# Patient Record
Sex: Male | Born: 1955
Health system: Southern US, Community
[De-identification: ages and names within clinical notes are randomized; demographics above are authoritative.]

## PROBLEM LIST (undated history)

## (undated) DIAGNOSIS — G959 Disease of spinal cord, unspecified: Secondary | ICD-10-CM

## (undated) DIAGNOSIS — E7800 Pure hypercholesterolemia, unspecified: Secondary | ICD-10-CM

## (undated) DIAGNOSIS — I350 Nonrheumatic aortic (valve) stenosis: Secondary | ICD-10-CM

## (undated) DIAGNOSIS — I10 Essential (primary) hypertension: Secondary | ICD-10-CM

## (undated) DIAGNOSIS — E119 Type 2 diabetes mellitus without complications: Secondary | ICD-10-CM

## (undated) HISTORY — PX: HERNIA REPAIR: SHX51

## (undated) HISTORY — DX: Nonrheumatic aortic (valve) stenosis: I35.0

## (undated) HISTORY — PX: BACK SURGERY: SHX140

## (undated) HISTORY — DX: Disease of spinal cord, unspecified: G95.9

---

## 1980-03-29 DIAGNOSIS — I639 Cerebral infarction, unspecified: Secondary | ICD-10-CM

## 1980-03-29 HISTORY — DX: Cerebral infarction, unspecified: I63.9

## 2000-10-20 ENCOUNTER — Emergency Department (HOSPITAL_COMMUNITY): Admission: EM | Admit: 2000-10-20 | Discharge: 2000-10-20 | Payer: Self-pay

## 2000-10-22 ENCOUNTER — Emergency Department (HOSPITAL_COMMUNITY): Admission: EM | Admit: 2000-10-22 | Discharge: 2000-10-22 | Payer: Self-pay | Admitting: Emergency Medicine

## 2001-06-23 ENCOUNTER — Ambulatory Visit (HOSPITAL_COMMUNITY): Admission: RE | Admit: 2001-06-23 | Discharge: 2001-06-23 | Payer: Self-pay

## 2001-07-21 ENCOUNTER — Encounter: Payer: Self-pay | Admitting: Neurosurgery

## 2001-07-25 ENCOUNTER — Inpatient Hospital Stay (HOSPITAL_COMMUNITY): Admission: RE | Admit: 2001-07-25 | Discharge: 2001-07-25 | Payer: Self-pay | Admitting: Neurosurgery

## 2006-08-18 ENCOUNTER — Ambulatory Visit (HOSPITAL_COMMUNITY): Admission: RE | Admit: 2006-08-18 | Discharge: 2006-08-18 | Payer: Self-pay | Admitting: Ophthalmology

## 2006-09-01 ENCOUNTER — Ambulatory Visit (HOSPITAL_COMMUNITY): Admission: RE | Admit: 2006-09-01 | Discharge: 2006-09-01 | Payer: Self-pay | Admitting: Ophthalmology

## 2010-08-11 NOTE — Op Note (Signed)
NAME:  Thomas [name], Thomas [name]               ACCOUNT NO.:  [id number]   MEDICAL RECORD NO.:  [id number]          PATIENT TYPE:  AMB   LOCATION:  SDS                          FACILITY:  [facility]   PHYSICIAN:  Robert L. Dione Booze, M.D.  DATE OF BIRTH:  19-Jul-1955   DATE OF PROCEDURE:  08/18/2006  DATE OF DISCHARGE:                               OPERATIVE REPORT   INDICATIONS AND JUSTIFICATIONS FOR THE PROCEDURE:  The patient has been  followed in my office since 1996 and over the recent years has bene  developing cataracts.  On July 22, 2006, he was significantly bothered  by the vision in his right eye.  Examination showed that the vision was  2200 in the right, drooping to count fingers with glare testing.  In the  left it was around 2025-2030, but surprisingly also does drop to count  fingers with glare testing.  Pressures were 20 in each eye and  confrontation fields were full.  The pupil motility, eyelids,  conjunctiva, cornea, anterior chamber and dilated fundus examination  were negative.  In the slit-lamp, he had primarily posterior subcapsular  cataracts bilaterally with the right significantly worse than the left.  The left was significant, however.  The problem was discussed and he  decided to go ahead and have his right cataract removed and have this  combined with the use of lens implant.  Medically, he should be stable.   JUSTIFICATION FOR PERFORMING THE PROCEDURE IN AN OUTPATIENT SETTING:  Routine.   JUSTIFICATION FOR OVERNIGHT STAY:  None.   PREOPERATIVE DIAGNOSIS:  Dense posterior subcapsular cataract, right  eye.   POSTOPERATIVE DIAGNOSIS:  Dense posterior subcapsular cataract, right  eye.   SURGEON:  Robert L. Dione Booze, M.D.   ANESTHESIA:  Standby with intravenous sedation and topical drops to  anesthetize the eye.   PROCEDURE PERFORMED:  Phacoemulsification with cataract extraction of  the right eye with lens implant.   PROCEDURE IN DETAIL:  The patient arrived in the  operating room and was  prepped and draped in the routine fashion.  A lid speculum was  positioned in the right eye.  A 3-mm temporal clear corneal incision was  made and some viscoelastic was put into the anterior chamber.  A  Superblade was used to make a small incision at the periphery of the  cornea supranasally.  A capsulorrhexis was then performed and  hydrodissection and hydrodelineation was performed.  The nucleus was  removed with the phacoemulsification unit.  The irrigation and  aspiration cannula was used to remove the residual cortical material.   Next, the posterior capsule did not polish well because of the density  of the posterior subcapsular cataract.  It does appear that a Yag laser  capsulotomy will be needed later.  Next, viscoelastic was put into the  eye and the lens implant was put into the eye and went nicely.  The  wound was hydrated and Zymar and Acular drops were used.  The eye was  covered with a shield.  The patient then left the operating room, having  done nicely.   FOLLOW-UP CARE:  The patient is to be seen in my office the next day to  have the eye examined and to be given further instructions.           ______________________________  Doris Cheadle Dione Booze, M.D.     RLG/MEDQ  D:  08/18/2006  T:  08/18/2006  Job:  [id number]   cc:   Areatha Keas, M.D.  Dr. Nobie Putnam

## 2010-08-11 NOTE — Op Note (Signed)
NAME:  Thomas [name], Thomas [name]               ACCOUNT NO.:  [id number]   MEDICAL RECORD NO.:  [id number]          PATIENT TYPE:  AMB   LOCATION:  SDS                          FACILITY:  [facility]   PHYSICIAN:  Robert L. Dione Booze, M.D.  DATE OF BIRTH:  April 03, 1955   DATE OF PROCEDURE:  09/01/2006  DATE OF DISCHARGE:  09/01/2006                               OPERATIVE REPORT   INDICATIONS AND JUSTIFICATION FOR THE PROCEDURE:  Mr. Spilde was seen  recently in my office on July 22, 2006 reporting that he had  significant visual disability from posterior subcapsular cataracts.  Indeed with glare testing, his left eye, which was correctable, was  around 20/30 to 20/40.  The count fingers on the right was best  corrected to 20/200.  Pressures were 20 and confrontation fields were  full.  The pupil's motility, lids, conjunctivae, cornea and dilated  fundus examination were negative, and he had dense posterior subcapsular  cataracts with the right worse than the left.  He already has had an  uncomplicated right cataract extraction with lens implant and now comes  back to have his left eye done.  Medically he should be stable.   JUSTIFICATION FOR PERFORMING PROCEDURE IN OUTPATIENT SETTING:  Routine.   JUSTIFICATION FOR OVERNIGHT STAY:  None.   PREOPERATIVE DIAGNOSIS:  Dense posterior subcapsular cataract.   POSTOPERATIVE DIAGNOSIS:  Dense posterior subcapsular cataract.   OPERATION PERFORMED:  Phacoemulsification cataract extraction of the  left eye with lens implant.   ANESTHESIA USED:  Is topical with IV anesthesia standby.   SURGEON:  Robert L. Dione Booze, M.D.   PROCEDURE:  The patient arrived in the operating room and was prepped  and draped in the routine fashion.  A lid speculum was positioned and a  temporal 3 mm limbal incision was made into clear cornea.  Some  viscoelastic material was placed into the anterior chamber and a  capsulorrhexis was created with a bent needle and capsulorrhexis  forceps.  Hydrodissection was performed and the nucleus was removed with  phacoemulsification unit.  The irrigation and aspiration unit was used  to remove the residual cornea material and the posterior capsule was  vacuumed.  More viscoelastic material was put into the anterior chamber  and the lens implant was placed into the capsular sac.  Irrigation and  aspiration were used again to remove the viscoelastic material and the  wound was hydrated.  Some Miochol was used and the globe was reformed  using balanced salt.  Topical __________  was used and the eye was  covered with a shield.  The patient then left the operating room having  done nicely.   FOLLOWUP CARE:  The patient is to be seen in my office the next day to  have the eye examined and to be given further instructions.           ______________________________  Doris Cheadle Dione Booze, M.D.     RLG/MEDQ  D:  09/01/2006  T:  09/02/2006  Job:  [id number]   cc:   Areatha Keas, M.D.  Dr. Liz Malady

## 2010-08-14 NOTE — H&P (Signed)
[place]  Patient:    Thomas [name], Thomas [name] Visit Number: [id number] MRN: [id number]          Service Type: SUR Location: 3000 3030 01 Attending Physician:  Emeterio Reeve Dictated by:   Payton Doughty, M.D. Admit Date:  07/25/2001 Discharge Date: 07/25/2001                           History and Physical  ADMITTING DIAGNOSIS:  Recurrent herniated disk at L5-S1 on the right side.  SERVICE:  Neurosurgery.  HISTORY OF PRESENT ILLNESS:  [age]-year-old right-handed white gentleman who in 1997 had a 5-1 disk done by Dr. Jeannett Senior C. Roxan Hockey.  A month ago, he had a marked increase in his back pain and right lower extremity pain.  MRI demonstrated a recurrent disk and he was referred to me.  MEDICAL HISTORY:  Medical history is otherwise remarkable for adult-onset diabetes and hypertension.  He had an operation by Dr. Izell Tupelo. Deaton in 1992 at L4-5, after which he was disabled.  PAST SURGICAL HISTORY:  Two back operations, in 1992 and 1997, a hernia operation in 1974 and a knee operation in 1988.  MEDICATIONS: 1. OxyContin 20 mg b.i.d. 2. Lorcet 10 mg four a day on a p.r.n. basis. 3. Pravachol 40 mg a day. 4. Klor-Con 10 mEq twice a day. 5. Hydrochlorothiazide 50 mg a day. 6. Synthroid 0.125 mg a day. 7. Glucotrol XL 5 mg b.i.d. 8. Lisinopril 10 mg q.i.d. 9. Nortriptyline 150 mg at night.  DRUG ALLERGIES:  None.  SOCIAL HISTORY:  He does not smoke or drink.  He is on disability.  FAMILY HISTORY:  Mom is 74, daddy is 70, history not given.  REVIEW OF SYSTEMS:  Review of systems is remarkable for glasses, hypertension, leg pain, diabetes and thyroid disease.   PHYSICAL EXAMINATION:  HEENT:  Within normal limits.  NECK:  He has reasonable range of motion in his neck.  CHEST:  Clear.  CARDIAC:  Regular rate and rhythm.  ABDOMEN:  Nontender with no hepatosplenomegaly.  EXTREMITIES:  Without clubbing or cyanosis.  GU:   Deferred.  PERIPHERAL PULSES:  Good.  NEUROLOGIC:  He is awake, alert and oriented.  His cranial nerves are intact. Motor exam showed 5/5 strength throughout the upper and lower extremities with slight dorsiflexion weakness on the right side.  Reflexes are a flicker at the knees and absent at the ankles bilaterally.  Straight leg is positive on the right.  LABORATORY AND ACCESSORY DATA:  MRI demonstrates a disk at L5-S1, recurrent, eccentric to the right.  There is scarring around the left L5 neural foramen.  CLINICAL IMPRESSION:  Right S1 radiculopathy secondary to recurrent herniated disk at L5-S1.  PLAN:  Plan is for redo laminectomy and diskectomy.  The risks and benefits of this approach have been discussed with him and he wishes to proceed. Dictated by:   Payton Doughty, M.D. Attending Physician:  Emeterio Reeve DD:  07/25/01 TD:  07/25/01 Job: [phone] [id number] [id number]

## 2010-08-14 NOTE — Op Note (Signed)
[place]  Patient:    Thomas [name], Thomas [name] Visit Number: [id number] MRN: [id number]          Service Type: SUR Location: 3000 3030 01 Attending Physician:  Emeterio Reeve Dictated by:   Payton Doughty, M.D. Proc. Date: 07/25/01 Admit Date:  07/25/2001 Discharge Date: 07/25/2001                             Operative Report  PREOPERATIVE DIAGNOSIS:  Recurrent herniated disk on the right side at L5-S1.  POSTOPERATIVE DIAGNOSIS:  Recurrent herniated disk on the right side at L5-S1.  OPERATIVE PROCEDURE:  Redo right L5-S1 diskectomy.  DICTATING DOCTOR AND SURGEON:  Payton Doughty, M.D.  ANESTHESIA:  General endotracheal, Percocet, and ___________.  COMPLICATIONS:  None.  NURSE ASSISTANT:  Cones.  DOCTOR ASSISTANT:  Dr. Venetia Maxon.  DESCRIPTION OF PROCEDURE:  This is a 55 year old, right-handed white gentleman with recurrent disk at L5-S1 on the right side. He is taken to the operating room. He is intubated and placed prone on the operating room table. The patient was prepped and draped in the usual sterile fashion. Skin was then injected with 1% lidocaine with epinephrine. Skin incision was reopened over the L5-S1 area, and the lamina of L5 was readily identified. Laminectomy defect circumferentially dissected. Working along the bone and gently removing small pieces out with the [place], the epidural space was dissected. The scar at S1 nerve root was identified, and the scar gently lysed with the hook. Immediately underneath it was a large, recurrent disk that was carefully dissected free and removed without difficulty. The disk space was explored and found to have minimal disk material remaining within it. The wound was irrigated; hemostasis assured. DepoMedrol _________ was used to cover the laminectomy defect. The fascia was reapproximated with 3-0 Vicryl in an interrupted fashion, subcutaneous tissues were reapproximated with 3-0 Vicryl in an  interrupted fashion, and the skin was closed with 3-0 nylon running. Bacitracin and Telfa dressing were applied and ___________. The patient returned to the recovery room in good condition. Dictated by:   Payton Doughty, M.D. Attending Physician:  Emeterio Reeve DD:  07/25/01 TD:  07/25/01 Job: [phone]

## 2011-01-14 LAB — BASIC METABOLIC PANEL
BUN: 6
CO2: 31
Calcium: 9.5
Chloride: 102
Creatinine, Ser: 0.86
GFR calc Af Amer: >60
GFR calc non Af Amer: >60
Glucose, Bld: 154 — ABNORMAL HIGH
Potassium: 4.7
Sodium: 139

## 2011-01-14 LAB — CBC
HCT: 43.3
Hemoglobin: 14.6
MCHC: 33.8
MCV: 91.5
Platelets: 217
RBC: 4.74
RDW: 13.3
WBC: 6

## 2012-12-26 ENCOUNTER — Other Ambulatory Visit (HOSPITAL_COMMUNITY): Payer: Self-pay | Admitting: Internal Medicine

## 2012-12-26 DIAGNOSIS — I10 Essential (primary) hypertension: Secondary | ICD-10-CM

## 2012-12-29 ENCOUNTER — Ambulatory Visit (HOSPITAL_COMMUNITY): Payer: Self-pay

## 2013-01-05 ENCOUNTER — Ambulatory Visit (HOSPITAL_COMMUNITY)
Admission: RE | Admit: 2013-01-05 | Discharge: 2013-01-05 | Disposition: A | Discharge status: Home / Self Care | Payer: 59 | Source: Ambulatory Visit | Attending: Internal Medicine | Admitting: Internal Medicine

## 2013-01-05 DIAGNOSIS — I059 Rheumatic mitral valve disease, unspecified: Secondary | ICD-10-CM | POA: Insufficient documentation

## 2013-01-05 DIAGNOSIS — I359 Nonrheumatic aortic valve disorder, unspecified: Secondary | ICD-10-CM | POA: Insufficient documentation

## 2013-01-05 DIAGNOSIS — I079 Rheumatic tricuspid valve disease, unspecified: Secondary | ICD-10-CM | POA: Insufficient documentation

## 2013-01-05 DIAGNOSIS — I517 Cardiomegaly: Secondary | ICD-10-CM | POA: Insufficient documentation

## 2013-01-05 DIAGNOSIS — I10 Essential (primary) hypertension: Secondary | ICD-10-CM

## 2013-01-05 DIAGNOSIS — R0609 Other forms of dyspnea: Secondary | ICD-10-CM | POA: Insufficient documentation

## 2013-01-05 DIAGNOSIS — R0989 Other specified symptoms and signs involving the circulatory and respiratory systems: Secondary | ICD-10-CM | POA: Insufficient documentation

## 2013-01-05 NOTE — Progress Notes (Signed)
  Echocardiogram 2D Echocardiogram has been performed.  Arvil Chaco 01/05/2013, 9:35 AM

## 2014-01-23 ENCOUNTER — Telehealth (HOSPITAL_COMMUNITY): Payer: Self-pay | Admitting: *Deleted

## 2015-04-01 MED FILL — POTASSIUM CL ER 10 MEQ TAB: 10 | 90 days supply | Qty: 180 | Fill #1

## 2015-04-01 MED FILL — NAPROXEN 500 MG TABLET: 500 | 30 days supply | Qty: 60 | Fill #3

## 2015-04-01 MED FILL — LEVOTHYROXINE 150 MCG TAB: 150 | 90 days supply | Qty: 90 | Fill #1

## 2015-04-01 MED FILL — NORTRIPTYLINE HCL 50 MG CAP: 50 | 90 days supply | Qty: 270 | Fill #0

## 2015-04-02 MED FILL — HYDROCODON-APAP 10-325: 10-325 | 30 days supply | Qty: 120 | Fill #0

## 2015-04-02 MED FILL — OxyCONTIN 20 MG T12A: 20 | 30 days supply | Qty: 90 | Fill #0

## 2015-05-01 MED FILL — NAPROXEN 500 MG TABLET: 500 | 30 days supply | Qty: 60 | Fill #4

## 2015-05-01 MED FILL — SIMVASTATIN 40 MG TABLET: 40 | 90 days supply | Qty: 90 | Fill #0

## 2015-05-02 MED FILL — HYDROCODON-APAP 10-325: 10-325 | 30 days supply | Qty: 120 | Fill #0

## 2015-05-02 MED FILL — OxyCONTIN 20 MG T12A: 20 | 30 days supply | Qty: 90 | Fill #0

## 2015-05-05 DIAGNOSIS — E1122 Type 2 diabetes mellitus with diabetic chronic kidney disease: Secondary | ICD-10-CM | POA: Diagnosis not present

## 2015-05-05 DIAGNOSIS — I129 Hypertensive chronic kidney disease with stage 1 through stage 4 chronic kidney disease, or unspecified chronic kidney disease: Secondary | ICD-10-CM | POA: Diagnosis not present

## 2015-05-12 DIAGNOSIS — N182 Chronic kidney disease, stage 2 (mild): Secondary | ICD-10-CM | POA: Diagnosis not present

## 2015-05-12 DIAGNOSIS — I129 Hypertensive chronic kidney disease with stage 1 through stage 4 chronic kidney disease, or unspecified chronic kidney disease: Secondary | ICD-10-CM | POA: Diagnosis not present

## 2015-05-12 DIAGNOSIS — F339 Major depressive disorder, recurrent, unspecified: Secondary | ICD-10-CM | POA: Diagnosis not present

## 2015-05-12 DIAGNOSIS — E1122 Type 2 diabetes mellitus with diabetic chronic kidney disease: Secondary | ICD-10-CM | POA: Diagnosis not present

## 2015-05-12 MED FILL — FARXIGA 5 MG TABLET: 5 | 30 days supply | Qty: 30 | Fill #0

## 2015-05-28 MED FILL — NAPROXEN 500 MG TABLET: 500 | 30 days supply | Qty: 60 | Fill #5

## 2015-05-28 MED FILL — LISINOPRIL-HCTZ 20-12.5 MG: 20-12.5 | 90 days supply | Qty: 180 | Fill #1

## 2015-05-28 MED FILL — ONE TOUCH ULTRA TEST STRIPS: 90 days supply | Qty: 100 | Fill #1

## 2015-05-28 MED FILL — GLYBURIDE-METFORMIN 5-500 M: 5-500 | 90 days supply | Qty: 360 | Fill #0

## 2015-05-30 MED FILL — OxyCONTIN 20 MG T12A: 20 | 30 days supply | Qty: 90 | Fill #0

## 2015-05-30 MED FILL — HYDROCODON-APAP 10-325: 10-325 | 30 days supply | Qty: 120 | Fill #0

## 2015-06-05 MED FILL — FARXIGA 5 MG TABLET: 5 | 30 days supply | Qty: 30 | Fill #1

## 2015-06-30 MED FILL — POTASSIUM CL ER 10 MEQ TAB: 10 | 90 days supply | Qty: 180 | Fill #0

## 2015-06-30 MED FILL — NAPROXEN 500 MG TABLET: 500 | 30 days supply | Qty: 60 | Fill #0

## 2015-06-30 MED FILL — LEVOTHYROXINE 150 MCG TAB: 150 | 90 days supply | Qty: 90 | Fill #0

## 2015-06-30 MED FILL — NORTRIPTYLINE HCL 50 MG CAP: 50 | 90 days supply | Qty: 270 | Fill #1

## 2015-07-01 MED FILL — OxyCONTIN 20 MG T12A: 20 | 30 days supply | Qty: 90 | Fill #0

## 2015-07-01 MED FILL — HYDROCODON-APAP 10-325: 10-325 | 30 days supply | Qty: 120 | Fill #0

## 2015-07-03 MED FILL — FARXIGA 5 MG TABLET: 5 | 30 days supply | Qty: 30 | Fill #2

## 2015-07-15 DIAGNOSIS — M2550 Pain in unspecified joint: Secondary | ICD-10-CM | POA: Diagnosis not present

## 2015-07-15 DIAGNOSIS — M150 Primary generalized (osteo)arthritis: Secondary | ICD-10-CM | POA: Diagnosis not present

## 2015-07-15 MED FILL — DICLOFENAC SODIUM 1% GEL: 1 | 30 days supply | Qty: 200 | Fill #0

## 2015-07-29 MED FILL — FARXIGA 5 MG TABLET: 5 | 30 days supply | Qty: 30 | Fill #3

## 2015-07-29 MED FILL — SIMVASTATIN 40 MG TABLET: 40 | 90 days supply | Qty: 90 | Fill #1

## 2015-07-29 MED FILL — NAPROXEN 500 MG TABLET: 500 | 30 days supply | Qty: 60 | Fill #1

## 2015-07-30 MED FILL — OxyCONTIN 20 MG T12A: 20 | 30 days supply | Qty: 90 | Fill #0

## 2015-07-30 MED FILL — HYDROCODON-APAP 10-325: 10-325 | 30 days supply | Qty: 120 | Fill #0

## 2015-08-06 DIAGNOSIS — E1122 Type 2 diabetes mellitus with diabetic chronic kidney disease: Secondary | ICD-10-CM | POA: Diagnosis not present

## 2015-08-06 DIAGNOSIS — I129 Hypertensive chronic kidney disease with stage 1 through stage 4 chronic kidney disease, or unspecified chronic kidney disease: Secondary | ICD-10-CM | POA: Diagnosis not present

## 2015-08-13 DIAGNOSIS — E785 Hyperlipidemia, unspecified: Secondary | ICD-10-CM | POA: Diagnosis not present

## 2015-08-13 DIAGNOSIS — F339 Major depressive disorder, recurrent, unspecified: Secondary | ICD-10-CM | POA: Diagnosis not present

## 2015-08-13 DIAGNOSIS — E1122 Type 2 diabetes mellitus with diabetic chronic kidney disease: Secondary | ICD-10-CM | POA: Diagnosis not present

## 2015-08-13 DIAGNOSIS — I129 Hypertensive chronic kidney disease with stage 1 through stage 4 chronic kidney disease, or unspecified chronic kidney disease: Secondary | ICD-10-CM | POA: Diagnosis not present

## 2015-08-13 MED FILL — TRULICITY 0.75 MG/0.5 ML PE: 0.75 | 28 days supply | Qty: 2 | Fill #0

## 2015-08-28 MED FILL — NAPROXEN 500 MG TABLET: 500 | 30 days supply | Qty: 60 | Fill #2

## 2015-08-28 MED FILL — FARXIGA 5 MG TABLET: 5 | 30 days supply | Qty: 30 | Fill #4

## 2015-08-28 MED FILL — LISINOPRIL-HCTZ 20-12.5 MG: 20-12.5 | 90 days supply | Qty: 180 | Fill #0

## 2015-08-29 MED FILL — HYDROCODON-APAP 10-325: 10-325 | 30 days supply | Qty: 120 | Fill #0

## 2015-08-29 MED FILL — OxyCONTIN 20 MG T12A: 20 | 30 days supply | Qty: 90 | Fill #0

## 2015-09-09 MED FILL — TRULICITY 0.75 MG/0.5 ML PE: 0.75 | 28 days supply | Qty: 2 | Fill #1

## 2015-09-25 MED FILL — GLYBURIDE-METFORMIN 5-500 M: 5-500 | 90 days supply | Qty: 360 | Fill #0

## 2015-09-25 MED FILL — LEVOTHYROXINE 150 MCG TAB: 150 | 90 days supply | Qty: 90 | Fill #1

## 2015-09-25 MED FILL — NORTRIPTYLINE HCL 50 MG CAP: 50 | 90 days supply | Qty: 270 | Fill #0

## 2015-09-25 MED FILL — KLOR-CON M10 TABLET: 10 | 90 days supply | Qty: 180 | Fill #1

## 2015-09-25 MED FILL — DICLOFENAC SODIUM 1% GEL: 1 | 30 days supply | Qty: 200 | Fill #1

## 2015-09-25 MED FILL — NAPROXEN 500 MG TABLET: 500 | 30 days supply | Qty: 60 | Fill #3

## 2015-09-25 MED FILL — FARXIGA 5 MG TABLET: 5 | 30 days supply | Qty: 30 | Fill #5

## 2015-09-29 MED FILL — HYDROCODON-APAP 10-325: 10-325 | 30 days supply | Qty: 120 | Fill #0

## 2015-09-29 MED FILL — OxyCONTIN 20 MG T12A: 20 | 30 days supply | Qty: 90 | Fill #0

## 2015-10-08 MED FILL — TRULICITY 0.75 MG/0.5 ML PE: 0.75 | 28 days supply | Qty: 2 | Fill #2

## 2015-10-29 MED FILL — SIMVASTATIN 40 MG TABLET: 40 | 90 days supply | Qty: 90 | Fill #0

## 2015-10-29 MED FILL — NAPROXEN 500 MG TABLET: 500 | 30 days supply | Qty: 60 | Fill #4

## 2015-10-29 MED FILL — FARXIGA 5 MG TABLET: 5 | 30 days supply | Qty: 30 | Fill #0

## 2015-10-29 MED FILL — ONE TOUCH ULTRA TEST STRIPS: 90 days supply | Qty: 100 | Fill #0

## 2015-10-30 MED FILL — OxyCONTIN 20 MG T12A: 20 | 30 days supply | Qty: 90 | Fill #0

## 2015-10-30 MED FILL — HYDROCODON-APAP 10-325: 10-325 | 30 days supply | Qty: 120 | Fill #0

## 2015-11-04 MED FILL — TRULICITY 0.75 MG/0.5 ML PE: 0.75 | 28 days supply | Qty: 2 | Fill #3

## 2015-11-05 DIAGNOSIS — I129 Hypertensive chronic kidney disease with stage 1 through stage 4 chronic kidney disease, or unspecified chronic kidney disease: Secondary | ICD-10-CM | POA: Diagnosis not present

## 2015-11-05 DIAGNOSIS — E1122 Type 2 diabetes mellitus with diabetic chronic kidney disease: Secondary | ICD-10-CM | POA: Diagnosis not present

## 2015-11-12 DIAGNOSIS — N182 Chronic kidney disease, stage 2 (mild): Secondary | ICD-10-CM | POA: Diagnosis not present

## 2015-11-12 DIAGNOSIS — I129 Hypertensive chronic kidney disease with stage 1 through stage 4 chronic kidney disease, or unspecified chronic kidney disease: Secondary | ICD-10-CM | POA: Diagnosis not present

## 2015-11-12 DIAGNOSIS — E1122 Type 2 diabetes mellitus with diabetic chronic kidney disease: Secondary | ICD-10-CM | POA: Diagnosis not present

## 2015-11-12 DIAGNOSIS — F339 Major depressive disorder, recurrent, unspecified: Secondary | ICD-10-CM | POA: Diagnosis not present

## 2015-11-25 MED FILL — TRULICITY 0.75 MG/0.5 ML PE: 0.75 | 28 days supply | Qty: 2 | Fill #4

## 2015-11-25 MED FILL — LISINOPRIL-HCTZ 20-12.5 MG: 20-12.5 | 90 days supply | Qty: 180 | Fill #1

## 2015-11-25 MED FILL — FARXIGA 5 MG TABLET: 5 | 30 days supply | Qty: 30 | Fill #1

## 2015-11-25 MED FILL — NAPROXEN 500 MG TABLET: 500 | 30 days supply | Qty: 60 | Fill #5

## 2015-11-25 MED FILL — DICLOFENAC SODIUM 1% GEL: 1 | 30 days supply | Qty: 200 | Fill #2

## 2015-11-28 MED FILL — OxyCONTIN 20 MG T12A: 20 | 30 days supply | Qty: 90 | Fill #0

## 2015-11-28 MED FILL — HYDROCODON-APAP 10-325: 10-325 | 30 days supply | Qty: 120 | Fill #0

## 2015-12-24 DIAGNOSIS — M150 Primary generalized (osteo)arthritis: Secondary | ICD-10-CM | POA: Diagnosis not present

## 2015-12-24 DIAGNOSIS — M2550 Pain in unspecified joint: Secondary | ICD-10-CM | POA: Diagnosis not present

## 2015-12-29 MED FILL — KLOR-CON M10 TABLET: 10 | 90 days supply | Qty: 180 | Fill #0

## 2015-12-29 MED FILL — NORTRIPTYLINE HCL 50 MG CAP: 50 | 90 days supply | Qty: 270 | Fill #1

## 2015-12-29 MED FILL — FARXIGA 5 MG TABLET: 5 | 30 days supply | Qty: 30 | Fill #2

## 2015-12-29 MED FILL — GLYBURIDE-METFORMIN 5-500 M: 5-500 | 90 days supply | Qty: 360 | Fill #1

## 2015-12-29 MED FILL — NAPROXEN 500 MG TABLET: 500 | 30 days supply | Qty: 60 | Fill #0

## 2015-12-29 MED FILL — LEVOTHYROXINE 150 MCG TAB: 150 | 90 days supply | Qty: 90 | Fill #0

## 2015-12-29 MED FILL — TRULICITY 0.75 MG/0.5 ML PE: 0.75 | 28 days supply | Qty: 2 | Fill #5

## 2015-12-30 MED FILL — OxyCONTIN 20 MG T12A: 20 | 30 days supply | Qty: 90 | Fill #0

## 2015-12-30 MED FILL — HYDROCODON-APAP 10-325: 10-325 | 30 days supply | Qty: 120 | Fill #0

## 2016-01-14 ENCOUNTER — Emergency Department (HOSPITAL_COMMUNITY): Payer: 59

## 2016-01-14 ENCOUNTER — Encounter (HOSPITAL_COMMUNITY): Payer: Self-pay | Admitting: *Deleted

## 2016-01-14 ENCOUNTER — Observation Stay (HOSPITAL_COMMUNITY)
Admission: EM | Admit: 2016-01-14 | Discharge: 2016-01-15 | Disposition: A | Discharge status: Home / Self Care | Payer: 59 | Attending: Internal Medicine | Admitting: Internal Medicine

## 2016-01-14 DIAGNOSIS — R55 Syncope and collapse: Secondary | ICD-10-CM | POA: Diagnosis present

## 2016-01-14 DIAGNOSIS — I10 Essential (primary) hypertension: Secondary | ICD-10-CM | POA: Diagnosis not present

## 2016-01-14 DIAGNOSIS — Z7984 Long term (current) use of oral hypoglycemic drugs: Secondary | ICD-10-CM | POA: Insufficient documentation

## 2016-01-14 DIAGNOSIS — Z87891 Personal history of nicotine dependence: Secondary | ICD-10-CM | POA: Diagnosis not present

## 2016-01-14 DIAGNOSIS — R197 Diarrhea, unspecified: Secondary | ICD-10-CM | POA: Diagnosis not present

## 2016-01-14 DIAGNOSIS — Y999 Unspecified external cause status: Secondary | ICD-10-CM | POA: Diagnosis not present

## 2016-01-14 DIAGNOSIS — M542 Cervicalgia: Secondary | ICD-10-CM | POA: Diagnosis not present

## 2016-01-14 DIAGNOSIS — E119 Type 2 diabetes mellitus without complications: Secondary | ICD-10-CM

## 2016-01-14 DIAGNOSIS — T7840XA Allergy, unspecified, initial encounter: Secondary | ICD-10-CM

## 2016-01-14 DIAGNOSIS — M546 Pain in thoracic spine: Secondary | ICD-10-CM | POA: Diagnosis not present

## 2016-01-14 DIAGNOSIS — S0101XA Laceration without foreign body of scalp, initial encounter: Secondary | ICD-10-CM | POA: Diagnosis not present

## 2016-01-14 DIAGNOSIS — Y939 Activity, unspecified: Secondary | ICD-10-CM | POA: Diagnosis not present

## 2016-01-14 DIAGNOSIS — I959 Hypotension, unspecified: Secondary | ICD-10-CM

## 2016-01-14 DIAGNOSIS — W1839XA Other fall on same level, initial encounter: Secondary | ICD-10-CM | POA: Diagnosis not present

## 2016-01-14 DIAGNOSIS — E1100 Type 2 diabetes mellitus with hyperosmolarity without nonketotic hyperglycemic-hyperosmolar coma (NKHHC): Secondary | ICD-10-CM | POA: Diagnosis not present

## 2016-01-14 DIAGNOSIS — Y92009 Unspecified place in unspecified non-institutional (private) residence as the place of occurrence of the external cause: Secondary | ICD-10-CM | POA: Diagnosis not present

## 2016-01-14 DIAGNOSIS — L509 Urticaria, unspecified: Secondary | ICD-10-CM | POA: Diagnosis present

## 2016-01-14 DIAGNOSIS — S299XXA Unspecified injury of thorax, initial encounter: Secondary | ICD-10-CM | POA: Diagnosis not present

## 2016-01-14 DIAGNOSIS — N179 Acute kidney failure, unspecified: Secondary | ICD-10-CM | POA: Diagnosis present

## 2016-01-14 DIAGNOSIS — S0181XA Laceration without foreign body of other part of head, initial encounter: Secondary | ICD-10-CM | POA: Diagnosis not present

## 2016-01-14 DIAGNOSIS — Z79899 Other long term (current) drug therapy: Secondary | ICD-10-CM | POA: Insufficient documentation

## 2016-01-14 DIAGNOSIS — S0990XA Unspecified injury of head, initial encounter: Secondary | ICD-10-CM | POA: Diagnosis not present

## 2016-01-14 DIAGNOSIS — M6281 Muscle weakness (generalized): Secondary | ICD-10-CM

## 2016-01-14 DIAGNOSIS — W19XXXA Unspecified fall, initial encounter: Secondary | ICD-10-CM

## 2016-01-14 DIAGNOSIS — M545 Low back pain: Secondary | ICD-10-CM | POA: Diagnosis not present

## 2016-01-14 HISTORY — DX: Pure hypercholesterolemia, unspecified: E78.00

## 2016-01-14 HISTORY — DX: Type 2 diabetes mellitus without complications: E11.9

## 2016-01-14 HISTORY — DX: Essential (primary) hypertension: I10

## 2016-01-14 LAB — URINALYSIS, ROUTINE W REFLEX MICROSCOPIC
Bilirubin Urine: NEGATIVE
Glucose, UA: >1000 mg/dL — AB
Hgb urine dipstick: NEGATIVE
Ketones, ur: 15 mg/dL — AB
LEUKOCYTES UA: NEGATIVE
NITRITE: NEGATIVE
PH: 5.5 (ref 5.0–8.0)
Protein, ur: NEGATIVE mg/dL
SPECIFIC GRAVITY, URINE: 1.015 (ref 1.005–1.030)

## 2016-01-14 LAB — CBC WITH DIFFERENTIAL/PLATELET
BASOS PCT: 0 %
Basophils Absolute: 0 10*3/uL (ref 0.0–0.1)
EOS ABS: 0.1 10*3/uL (ref 0.0–0.7)
Eosinophils Relative: 1 %
HCT: 50.4 % (ref 39.0–52.0)
HEMOGLOBIN: 16.9 g/dL (ref 13.0–17.0)
Lymphocytes Relative: 11 %
Lymphs Abs: 1 10*3/uL (ref 0.7–4.0)
MCH: 29.6 pg (ref 26.0–34.0)
MCHC: 33.5 g/dL (ref 30.0–36.0)
MCV: 88.4 fL (ref 78.0–100.0)
Monocytes Absolute: 0.3 10*3/uL (ref 0.1–1.0)
Monocytes Relative: 4 %
NEUTROS PCT: 84 %
Neutro Abs: 7.6 10*3/uL (ref 1.7–7.7)
Platelets: 292 10*3/uL (ref 150–400)
RBC: 5.7 MIL/uL (ref 4.22–5.81)
RDW: 13.4 % (ref 11.5–15.5)
WBC: 9 10*3/uL (ref 4.0–10.5)

## 2016-01-14 LAB — TSH: TSH: 1.388 u[IU]/mL (ref 0.350–4.500)

## 2016-01-14 LAB — COMPREHENSIVE METABOLIC PANEL
ALBUMIN: 3.4 g/dL — AB (ref 3.5–5.0)
ALK PHOS: 39 U/L (ref 38–126)
ALT: 28 U/L (ref 17–63)
AST: 23 U/L (ref 15–41)
Anion gap: 11 (ref 5–15)
BUN: 34 mg/dL — ABNORMAL HIGH (ref 6–20)
CALCIUM: 7.5 mg/dL — AB (ref 8.9–10.3)
CO2: 18 mmol/L — AB (ref 22–32)
CREATININE: 1.76 mg/dL — AB (ref 0.61–1.24)
Chloride: 104 mmol/L (ref 101–111)
GFR calc Af Amer: 46 mL/min — ABNORMAL LOW (ref >60)
GFR calc non Af Amer: 40 mL/min — ABNORMAL LOW (ref >60)
GLUCOSE: 217 mg/dL — AB (ref 65–99)
Potassium: 4.5 mmol/L (ref 3.5–5.1)
SODIUM: 133 mmol/L — AB (ref 135–145)
Total Bilirubin: 1.2 mg/dL (ref 0.3–1.2)
Total Protein: 6 g/dL — ABNORMAL LOW (ref 6.5–8.1)

## 2016-01-14 LAB — URINE MICROSCOPIC-ADD ON
BACTERIA UA: NONE SEEN
RBC / HPF: NONE SEEN RBC/hpf (ref 0–5)
WBC UA: NONE SEEN WBC/hpf (ref 0–5)

## 2016-01-14 LAB — GLUCOSE, CAPILLARY
GLUCOSE-CAPILLARY: 280 mg/dL — AB (ref 65–99)
GLUCOSE-CAPILLARY: 280 mg/dL — AB (ref 65–99)

## 2016-01-14 LAB — LACTIC ACID, PLASMA
Lactic Acid, Venous: 2.5 mmol/L (ref 0.5–1.9)
Lactic Acid, Venous: 1.6 mmol/L (ref 0.5–1.9)

## 2016-01-14 LAB — TROPONIN I: Troponin I: <0.03 ng/mL (ref <0.03)

## 2016-01-14 LAB — LIPASE, BLOOD: Lipase: 14 U/L (ref 11–51)

## 2016-01-14 MED ORDER — PNEUMOCOCCAL VAC POLYVALENT 25 MCG/0.5ML IJ INJ: 0.5000 mL | INJECTION | INTRAMUSCULAR | Status: DC

## 2016-01-14 MED ORDER — SODIUM CHLORIDE 0.9 % IV BOLUS (SEPSIS)
1000.0000 mL | Freq: Once | INTRAVENOUS | Status: AC
  Administered 2016-01-14: 1000 mL via INTRAVENOUS

## 2016-01-14 MED ORDER — HEPARIN SODIUM (PORCINE) 5000 UNIT/ML IJ SOLN
5000.0000 [IU] | Freq: Three times a day (TID) | INTRAMUSCULAR | Status: DC
  Administered 2016-01-14 (×2): 5000 [IU] via SUBCUTANEOUS
  Filled 2016-01-14 (×2): qty 1

## 2016-01-14 MED ORDER — SODIUM CHLORIDE 0.9 % IV BOLUS (SEPSIS): 1000.0000 mL | INTRAVENOUS | Status: DC | PRN

## 2016-01-14 MED ORDER — ONDANSETRON HCL 4 MG/2ML IJ SOLN: 4.0000 mg | Freq: Three times a day (TID) | INTRAMUSCULAR | Status: DC | PRN

## 2016-01-14 MED ORDER — ACETAMINOPHEN 325 MG PO TABS: 650.0000 mg | ORAL_TABLET | Freq: Four times a day (QID) | ORAL | Status: DC | PRN

## 2016-01-14 MED ORDER — BUPIVACAINE HCL (PF) 0.5 % IJ SOLN
10.0000 mL | Freq: Once | INTRAMUSCULAR | Status: AC
  Administered 2016-01-14: 10 mL
  Filled 2016-01-14: qty 30

## 2016-01-14 MED ORDER — FAMOTIDINE IN NACL 20-0.9 MG/50ML-% IV SOLN
20.0000 mg | Freq: Once | INTRAVENOUS | Status: AC
  Administered 2016-01-14: 20 mg via INTRAVENOUS
  Filled 2016-01-14: qty 50

## 2016-01-14 MED ORDER — FAMOTIDINE IN NACL 20-0.9 MG/50ML-% IV SOLN
20.0000 mg | Freq: Two times a day (BID) | INTRAVENOUS | Status: DC
  Administered 2016-01-14 – 2016-01-15 (×3): 20 mg via INTRAVENOUS
  Filled 2016-01-14 (×3): qty 50

## 2016-01-14 MED ORDER — LEVOTHYROXINE SODIUM 75 MCG PO TABS
150.0000 ug | ORAL_TABLET | Freq: Every day | ORAL | Status: DC
  Administered 2016-01-14 – 2016-01-15 (×2): 150 ug via ORAL
  Filled 2016-01-14 (×2): qty 2

## 2016-01-14 MED ORDER — SODIUM CHLORIDE 0.9 % IV BOLUS (SEPSIS): 1000.0000 mL | Freq: Once | INTRAVENOUS | Status: DC

## 2016-01-14 MED ORDER — SODIUM CHLORIDE 0.9 % IV BOLUS (SEPSIS)
500.0000 mL | Freq: Once | INTRAVENOUS | Status: AC
  Administered 2016-01-14: 500 mL via INTRAVENOUS

## 2016-01-14 MED ORDER — OXYCODONE HCL ER 20 MG PO T12A
20.0000 mg | EXTENDED_RELEASE_TABLET | Freq: Two times a day (BID) | ORAL | Status: DC
  Administered 2016-01-14 – 2016-01-15 (×2): 20 mg via ORAL
  Filled 2016-01-14 (×2): qty 1

## 2016-01-14 MED ORDER — INSULIN ASPART 100 UNIT/ML ~~LOC~~ SOLN
0.0000 [IU] | Freq: Every day | SUBCUTANEOUS | Status: DC
  Administered 2016-01-14: 2 [IU] via SUBCUTANEOUS

## 2016-01-14 MED ORDER — SODIUM CHLORIDE 0.9 % IV SOLN
INTRAVENOUS | Status: DC
  Administered 2016-01-14: 12:00:00 via INTRAVENOUS

## 2016-01-14 MED ORDER — INFLUENZA VAC SPLIT QUAD 0.5 ML IM SUSY: 0.5000 mL | PREFILLED_SYRINGE | INTRAMUSCULAR | Status: DC

## 2016-01-14 MED ORDER — ONDANSETRON HCL 4 MG/2ML IJ SOLN: 4.0000 mg | Freq: Four times a day (QID) | INTRAMUSCULAR | Status: DC | PRN

## 2016-01-14 MED ORDER — DIPHENHYDRAMINE HCL 50 MG/ML IJ SOLN: 25.0000 mg | Freq: Four times a day (QID) | INTRAMUSCULAR | Status: DC | PRN

## 2016-01-14 MED ORDER — METRONIDAZOLE IN NACL 5-0.79 MG/ML-% IV SOLN
500.0000 mg | Freq: Three times a day (TID) | INTRAVENOUS | Status: DC
  Administered 2016-01-14 – 2016-01-15 (×3): 500 mg via INTRAVENOUS
  Filled 2016-01-14 (×4): qty 100

## 2016-01-14 MED ORDER — NAPROXEN 250 MG PO TABS
500.0000 mg | ORAL_TABLET | Freq: Two times a day (BID) | ORAL | Status: DC
  Filled 2016-01-14: qty 2

## 2016-01-14 MED ORDER — DEXTROSE 5 % IV SOLN
2.0000 g | Freq: Once | INTRAVENOUS | Status: DC
  Administered 2016-01-14: 2 g via INTRAVENOUS
  Filled 2016-01-14: qty 2

## 2016-01-14 MED ORDER — PNEUMOCOCCAL VAC POLYVALENT 25 MCG/0.5ML IJ INJ
0.5000 mL | INJECTION | INTRAMUSCULAR | Status: AC
  Administered 2016-01-15: 0.5 mL via INTRAMUSCULAR
  Filled 2016-01-14: qty 0.5

## 2016-01-14 MED ORDER — METHYLPREDNISOLONE SODIUM SUCC 125 MG IJ SOLR
80.0000 mg | Freq: Two times a day (BID) | INTRAMUSCULAR | Status: DC
  Administered 2016-01-14 – 2016-01-15 (×3): 80 mg via INTRAVENOUS
  Filled 2016-01-14 (×3): qty 2

## 2016-01-14 MED ORDER — HYDROXYZINE HCL 25 MG PO TABS
50.0000 mg | ORAL_TABLET | Freq: Once | ORAL | Status: AC
  Administered 2016-01-14: 50 mg via ORAL
  Filled 2016-01-14: qty 2

## 2016-01-14 MED ORDER — LEVOFLOXACIN IN D5W 750 MG/150ML IV SOLN
750.0000 mg | Freq: Once | INTRAVENOUS | Status: DC
  Filled 2016-01-14: qty 150

## 2016-01-14 MED ORDER — VANCOMYCIN HCL IN DEXTROSE 1-5 GM/200ML-% IV SOLN: 1000.0000 mg | Freq: Once | INTRAVENOUS | Status: DC

## 2016-01-14 MED ORDER — ALBUTEROL SULFATE (2.5 MG/3ML) 0.083% IN NEBU: 2.5000 mg | INHALATION_SOLUTION | RESPIRATORY_TRACT | Status: DC | PRN

## 2016-01-14 MED ORDER — DIPHENHYDRAMINE HCL 50 MG/ML IJ SOLN
50.0000 mg | Freq: Once | INTRAMUSCULAR | Status: DC
  Filled 2016-01-14: qty 1

## 2016-01-14 MED ORDER — INSULIN ASPART 100 UNIT/ML ~~LOC~~ SOLN
0.0000 [IU] | Freq: Three times a day (TID) | SUBCUTANEOUS | Status: DC
  Administered 2016-01-14: 8 [IU] via SUBCUTANEOUS
  Administered 2016-01-15: 3 [IU] via SUBCUTANEOUS

## 2016-01-14 MED ORDER — INSULIN GLARGINE 100 UNIT/ML ~~LOC~~ SOLN
20.0000 [IU] | Freq: Every day | SUBCUTANEOUS | Status: DC
  Administered 2016-01-14 – 2016-01-15 (×2): 20 [IU] via SUBCUTANEOUS
  Filled 2016-01-14 (×3): qty 0.2

## 2016-01-14 MED ORDER — NORTRIPTYLINE HCL 25 MG PO CAPS
150.0000 mg | ORAL_CAPSULE | Freq: Every day | ORAL | Status: DC
  Administered 2016-01-14: 150 mg via ORAL
  Filled 2016-01-14: qty 6

## 2016-01-14 MED ORDER — METHYLPREDNISOLONE SODIUM SUCC 125 MG IJ SOLR
125.0000 mg | Freq: Once | INTRAMUSCULAR | Status: AC
  Administered 2016-01-14: 125 mg via INTRAVENOUS
  Filled 2016-01-14: qty 2

## 2016-01-14 MED ORDER — INFLUENZA VAC SPLIT QUAD 0.5 ML IM SUSY
0.5000 mL | PREFILLED_SYRINGE | INTRAMUSCULAR | Status: AC
  Administered 2016-01-15: 0.5 mL via INTRAMUSCULAR
  Filled 2016-01-14: qty 0.5

## 2016-01-14 NOTE — ED Notes (Signed)
Denies any itching at this time.

## 2016-01-14 NOTE — H&P (Addendum)
[facility] H&P   Patient Demographics:    Thomas [name], is a 60 y.o. male  MRN: [id number]   DOB - 28-Jul-1955  Admit Date - 01/14/2016  Outpatient Primary MD for the patient is Thayer Headings, MD  Patient coming from: [facility]  Chief Complaint  Patient presents with  . Allergic Reaction      HPI:    Thomas [name]  is a 60 y.o. male, With history of DM type II, hypertension, dyslipidemia who had diarrhea 2 days ago which resolved by itself, this happened after he ate a sandwich at a restaurant, his diarrhea improved but about 24 hours later he woke up early this morning 3 AM with intense itching all over, he also fell while getting up and hit his head and had a small laceration in the left occipital area. He came to the ER.  In the ER he was found to be dehydrated, ARF, left occipital scalp laceration, also had evidence of hives and I was called to admit. Patient denies any changes in medications, detergent, soap, pets, exposure to sick contacts, travel, outdoor activity or exposure to poison ivy. Only inciting factor seems to be the sandwich he ate.  He is currently feeling better, no fever or chills, no headache, no focal weakness, no chest pain or abdominal pain, diarrhea resolved about 36 hours ago, no blood in stool or urine, no joint pains or aches.    Review of systems:    In addition to the HPI above,   No Fever-chills, No Headache, No changes with Vision or hearing, No problems swallowing food or Liquids, No Chest pain, Cough or Shortness of Breath, No Abdominal pain, No Nausea or Vommitting, Bowel movements are regular, No Blood in stool or Urine, No dysuria, No new skin rashes or bruises, Except multiple  generalized hives No new joints pains-aches,  No new weakness, tingling, numbness in any extremity,Generalized weakness No recent weight gain or loss, No polyuria, polydypsia or polyphagia, No significant Mental Stressors.  A full 10 point Review of Systems was done, except as stated above, all other Review of Systems were negative.   With Past History of the following :    Past Medical History:  Diagnosis Date  . Diabetes mellitus without complication (HCC)   . Hypercholesterolemia   . Hypertension  Past Surgical History:  Procedure Laterality Date  . BACK SURGERY        Social History:     Social History  Substance Use Topics  . Smoking status: Former Games developer  . Smokeless tobacco: Never Used  . Alcohol use No         Family History :   No history of CAD   Home Medications:   Prior to Admission medications   Medication Sig Start Date End Date Taking? Authorizing Provider  dapagliflozin propanediol (FARXIGA) 5 MG TABS tablet Take 5 mg by mouth daily.   Yes Historical Provider, MD  Dulaglutide (TRULICITY [place]) Inject 1 Units into the skin once a week.   Yes Historical Provider, MD  Dulaglutide (TRULICITY) 0.75 MG/0.5ML SOPN Inject 0.5 mLs into the skin once a week.   Yes Historical Provider, MD  glyBURIDE-metformin (GLUCOVANCE) 5-500 MG tablet Take 2 tablets by mouth 2 (two) times daily with a meal.   Yes Historical Provider, MD  HYDROcodone-acetaminophen (NORCO) 10-325 MG tablet Take 1 tablet by mouth every 6 (six) hours as needed.   Yes Historical Provider, MD  levothyroxine (SYNTHROID, LEVOTHROID) 150 MCG tablet Take 150 mcg by mouth daily before breakfast.   Yes Historical Provider, MD  lisinopril (PRINIVIL,ZESTRIL) 10 MG tablet Take 10 mg by mouth 2 (two) times daily.   Yes Historical Provider, MD  naproxen (NAPROSYN) 500 MG tablet Take 500 mg by mouth 2 (two) times daily with a meal.   Yes Historical Provider, MD  nortriptyline (PAMELOR) 50 MG capsule Take  150 mg by mouth at bedtime.   Yes Historical Provider, MD  Omega-3 Fatty Acids (FISH OIL) 1000 MG CAPS Take 1 capsule by mouth 2 (two) times daily.   Yes Historical Provider, MD  oxyCODONE (OXYCONTIN) 20 mg 12 hr tablet Take 20 mg by mouth every 12 (twelve) hours.   Yes Historical Provider, MD  potassium chloride (MICRO-K) 10 MEQ CR capsule Take 10 mEq by mouth 2 (two) times daily.   Yes Historical Provider, MD  simvastatin (ZOCOR) 40 MG tablet Take 40 mg by mouth daily.   Yes Historical Provider, MD     Allergies:     Allergies  Allergen Reactions  . Other     Stomach medicine, not sure of name. Had a mini stroke  . Penicillins Swelling    Arm swelling Has patient had a PCN reaction causing immediate rash, facial/tongue/throat swelling, SOB or lightheadedness with hypotension: No Has patient had a PCN reaction causing severe rash involving mucus membranes or skin necrosis: No Has patient had a PCN reaction that required hospitalization No Has patient had a PCN reaction occurring within the last 10 years: No If all of the above answers are "NO", then may proceed with Cephalosporin use.     Physical Exam:   Vitals  Blood pressure 134/83, pulse 88, temperature 98.5 F (36.9 C), temperature source Oral, resp. rate 18, height 6' (1.829 m), weight 109.8 kg (242 lb), SpO2 100 %.   1. General middle aged white male lying in bed in NAD,    2. Normal affect and insight, Not Suicidal or Homicidal, Awake Alert, Oriented X 3.  3. No F.N deficits, ALL C.Nerves Intact, Strength 5/5 all 4 extremities, Sensation intact all 4 extremities, Plantars down going.  4. Ears and Eyes appear Normal, Conjunctivae clear, PERRLA. Moist Oral Mucosa.  5. Supple Neck, No JVD, No cervical lymphadenopathy appriciated, No Carotid Bruits.  6. Symmetrical Chest wall movement, Good air movement bilaterally,  CTAB.  7. RRR, No Gallops, Rubs or Murmurs, No Parasternal Heave.  8. Positive Bowel Sounds, Abdomen  Soft, No tenderness, No organomegaly appriciated,No rebound -guarding or rigidity.  9.  No Cyanosis, Normal Skin Turgor, No Skin Rash or Bruise.  10. Good muscle tone,  joints appear normal , no effusions, Normal ROM.  11. No Palpable Lymph Nodes in Neck or Axillae        Data Review:    CBC  Recent Labs Lab 01/14/16 0851  WBC 9.0  HGB 16.9  HCT 50.4  PLT 292  MCV 88.4  MCH 29.6  MCHC 33.5  RDW 13.4  LYMPHSABS 1.0  MONOABS 0.3  EOSABS 0.1  BASOSABS 0.0   ------------------------------------------------------------------------------------------------------------------  Chemistries   Recent Labs Lab 01/14/16 0851  NA 133*  K 4.5  CL 104  CO2 18*  GLUCOSE 217*  BUN 34*  CREATININE 1.76*  CALCIUM 7.5*  AST 23  ALT 28  ALKPHOS 39  BILITOT 1.2   ------------------------------------------------------------------------------------------------------------------ estimated creatinine clearance is 56.4 mL/min (by C-G formula based on SCr of 1.76 mg/dL (H)). ------------------------------------------------------------------------------------------------------------------  Recent Labs  01/14/16 0851  TSH 1.388    Coagulation profile No results for input(s): INR, PROTIME in the last 168 hours. ------------------------------------------------------------------------------------------------------------------- No results for input(s): DDIMER in the last 72 hours. -------------------------------------------------------------------------------------------------------------------  Cardiac Enzymes  Recent Labs Lab 01/14/16 0851  TROPONINI <0.03   ------------------------------------------------------------------------------------------------------------------ No results found for: BNP   ---------------------------------------------------------------------------------------------------------------  Urinalysis    Component Value Date/Time   COLORURINE  YELLOW 01/14/2016 0841   APPEARANCEUR CLEAR 01/14/2016 0841   LABSPEC 1.015 01/14/2016 0841   PHURINE 5.5 01/14/2016 0841   GLUCOSEU >1000 (A) 01/14/2016 0841   HGBUR NEGATIVE 01/14/2016 0841   BILIRUBINUR NEGATIVE 01/14/2016 0841   KETONESUR 15 (A) 01/14/2016 0841   PROTEINUR NEGATIVE 01/14/2016 0841   NITRITE NEGATIVE 01/14/2016 0841   LEUKOCYTESUR NEGATIVE 01/14/2016 0841    ----------------------------------------------------------------------------------------------------------------   Imaging Results:    Dg Cervical Spine Complete  Result Date: 01/14/2016 CLINICAL DATA:  Larey Seat at home this morning. Neck pain. Initial encounter. EXAM: CERVICAL SPINE - COMPLETE 4+ VIEW COMPARISON:  None. FINDINGS: There is no evidence of cervical spine fracture or prevertebral soft tissue swelling. Alignment is normal. Moderate to severe degenerative disc disease is seen from levels of C4-C7. No other significant bone abnormality identified. IMPRESSION: No acute findings.  Degenerative spondylosis, as described above. Electronically Signed   By: Myles Rosenthal M.D.   On: 01/14/2016 09:20   Dg Thoracic Spine 4v  Result Date: 01/14/2016 CLINICAL DATA:  Thoracic spine pain after fall at home today. EXAM: THORACIC SPINE - 4+ VIEW COMPARISON:  Radiographs of Aug 17, 2006. FINDINGS: No fracture or spondylolisthesis is noted. Minimal anterior osteophyte formation is noted at multiple levels. Disc spaces appear to be well maintained. IMPRESSION: No acute abnormality seen in the thoracic spine. Electronically Signed   By: Lupita Raider, M.D.   On: 01/14/2016 09:20   Ct Head Wo Contrast  Result Date: 01/14/2016 CLINICAL DATA:  Head laceration after fall today. EXAM: CT HEAD WITHOUT CONTRAST TECHNIQUE: Contiguous axial images were obtained from the base of the skull through the vertex without intravenous contrast. COMPARISON:  None. FINDINGS: Brain: No mass effect or midline shift is noted. Ventricular size is  within normal limits. There is no evidence of mass lesion, hemorrhage or acute infarction. Vascular: Atherosclerosis of basilar artery is noted. Skull: Bony calvarium appears intact. Sinuses/Orbits: Visualized paranasal sinuses appear normal. Other: Surgical staples  are noted in the posterior scalp tissues. IMPRESSION: No acute intracranial abnormality seen. Electronically Signed   By: Lupita RaiderJames  Green Jr, M.D.   On: 01/14/2016 09:13   Dg Lumbar Spine 2-3vclearing  Result Date: 01/14/2016 CLINICAL DATA:  Fall this morning. Low back pain. Initial encounter. EXAM: LIMITED LUMBAR SPINE FOR TRAUMA CLEARING - 2-3 VIEW COMPARISON:  None. FINDINGS: AP and lateral views show no evidence of lumbar spine fracture or subluxation. Severe degenerative disc disease is seen at levels of L4-5 and L5-S1. IMPRESSION: No acute findings.  Severe lower lumbar degenerative disc disease. Electronically Signed   By: Myles RosenthalJohn  Stahl M.D.   On: 01/14/2016 09:22    My personal review of EKG: Rhythm NSR, Rate  91 /min,   no Acute ST changes   Assessment & Plan:     1. Syncope likely due to dehydration caused by diarrhea with hypotension and ARF. 60 year old location, telemetry monitor, IV fluids bolus then maintenance, monitor orthostatics, monitor renal function did diarrhea has since resolved >36 hours ago will monitor.  2. Hives with intense itching. Likely allergic reaction to the sandwich which caused both the diarrhea and allergic reaction. Hives as in #1 above, no tongue or laryngeal involvement. No lip swelling. Sorry Medrol, Benadryl and H2 blocker.  3. DM type II. Check A1c, place on Lantus and sliding scale, for now avoid oral medications.  4. Hypothyroidism. Continue home dose Synthroid TSH is stable.  5. Dyslipidemia. For now hold statin.   DVT Prophylaxis Heparin    AM Labs Ordered, also please review Full Orders  Family Communication: Admission, patients condition and plan of care including tests being  ordered have been discussed with the patient and family who indicate understanding and agree with the plan and Code Status.  Code Status Full  Likely DC to  Home in am  Condition fair  Consults called: None    Admission status: Obs    Time spent in minutes : 30   Susa RaringSINGH,Jamis Kryder K M.D on 01/14/2016 at 12:49 PM  Between 7am to 7pm - Pager - [phone]. After 7pm go to [email or web address] - password [email or web address]  [facility] - Office  [phone]

## 2016-01-14 NOTE — ED Notes (Signed)
Given 25mg  benadryl iv by ems

## 2016-01-14 NOTE — ED Notes (Signed)
Pt stable and ready for transport to [facility] room 307.

## 2016-01-14 NOTE — ED Notes (Signed)
Scattered hives noted with puffy eyes.  C/o itching only to both ankles.

## 2016-01-14 NOTE — ED Provider Notes (Signed)
0800: Pt received at sign out with XR pending. Pt woke up approximately 3am "feeling itchy" and "thirsty." States he got out of bed and walked to the bathroom and fell down d/t generalized weakness. +hives noted by pt and family at that time. Endorses nausea, poor PO intake, and multiple episodes of diarrhea for the past 3 days. Orthostatic VS as below. IVF ordered. Hives improved after standard tx. Lac repaired. Will continue to monitor.   0935:  Pt continues hypotensive, lactic acid 2.5. No stooling or vomiting while in the ED. Given pt's hx diarrhea x3 days, Code Sepsis called, with additional IVF and abx ordered. Will admit.   1010:  New ARF on labs. IVF continues with slow improvement. Dx and testing d/w pt and family.  Questions answered.  Verb understanding, agreeable to admit. T/C to [facility] Dr. Thedore MinsSingh, case discussed, including:  HPI, pertinent PM/SHx, VS/PE, dx testing, ED course and treatment:  Agreeable to admit, requests to write temporary orders, obtain observation tele bed to team APAdmits.   08:09:42 Orthostatic Vital Signs DA  Orthostatic Lying   BP- Lying: 100/67  Pulse- Lying: 89 (Sats 98% on RA)      Orthostatic Sitting  BP- Sitting: 92/75  Pulse- Sitting: 95 (Sats 96% )      Orthostatic Standing at 0 minutes  BP- Standing at 0 minutes:  89/58  Pulse- Standing at 0 minutes: 98 (Sats 98% on RA Denies any dizziness)    Patient Vitals for the past 24 hrs:  BP Temp Temp src Pulse Resp SpO2 Height Weight  01/14/16 0930 108/70 - - 95 19 98 % - -  01/14/16 0904 101/68 - - 92 23 97 % - -  01/14/16 0800 102/65 - - 89 24 98 % - -  01/14/16 0730 97/65 - - 85 24 99 % - -  01/14/16 0715 90/56 97.6 F (36.4 C) Oral 85 26 98 % - -  01/14/16 0630 99/75 - - 78 18 100 % - -  01/14/16 0600 94/61 - - 78 19 98 % - -  01/14/16 0547 91/62 - - 81 23 98 % - -  01/14/16 0538 (!) 81/54 97.7 F (36.5 C) Oral 85 18 99 % - -  01/14/16 0532 - - - - - - 6' (1.829 m) 230 lb (104.3 kg)      MDM Reviewed: previous chart, nursing note and vitals Reviewed previous: labs and ECG Interpretation: labs, ECG, x-ray and CT scan Total time providing critical care: 30-74 minutes. This excludes time spent performing separately reportable procedures and services. Consults: admitting MD   CRITICAL CARE Performed by: Laray AngerMCMANUS,Tyianna Menefee M Total critical care time: 35 minutes Critical care time was exclusive of separately billable procedures and treating other patients. Critical care was necessary to treat or prevent imminent or life-threatening deterioration. Critical care was time spent personally by me on the following activities: development of treatment plan with patient and/or surrogate as well as nursing, discussions with consultants, evaluation of patient's response to treatment, examination of patient, obtaining history from patient or surrogate, ordering and performing treatments and interventions, ordering and review of laboratory studies, ordering and review of radiographic studies, pulse oximetry and re-evaluation of patient's condition.    EKG Interpretation  Date/Time:  Wednesday January 14 2016 09:09:25 EDT Ventricular Rate:  91 PR Interval:    QRS Duration: 99 QT Interval:  371 QTC Calculation: 457 R Axis:   -77 Text Interpretation:  Sinus rhythm Left axis deviation Left anterior fascicular block  Abnormal R-wave progression, late transition When compared with ECG of 08/17/2006 No significant change was found Confirmed by [facility]  MD, Nicholos Johns [phone]) on 01/14/2016 9:17:55 AM       Results for orders placed or performed during the hospital encounter of 01/14/16  CBC with Differential  Result Value Ref Range   WBC 9.0 4.0 - 10.5 K/uL   RBC 5.70 4.22 - 5.81 MIL/uL   Hemoglobin 16.9 13.0 - 17.0 g/dL   HCT 60.4 54.0 - 98.1 %   MCV 88.4 78.0 - 100.0 fL   MCH 29.6 26.0 - 34.0 pg   MCHC 33.5 30.0 - 36.0 g/dL   RDW 19.1 47.8 - 29.5 %   Platelets 292 150 - 400 K/uL    Neutrophils Relative % 84 %   Neutro Abs 7.6 1.7 - 7.7 K/uL   Lymphocytes Relative 11 %   Lymphs Abs 1.0 0.7 - 4.0 K/uL   Monocytes Relative 4 %   Monocytes Absolute 0.3 0.1 - 1.0 K/uL   Eosinophils Relative 1 %   Eosinophils Absolute 0.1 0.0 - 0.7 K/uL   Basophils Relative 0 %   Basophils Absolute 0.0 0.0 - 0.1 K/uL  Comprehensive metabolic panel  Result Value Ref Range   Sodium 133 (L) 135 - 145 mmol/L   Potassium 4.5 3.5 - 5.1 mmol/L   Chloride 104 101 - 111 mmol/L   CO2 18 (L) 22 - 32 mmol/L   Glucose, Bld 217 (H) 65 - 99 mg/dL   BUN 34 (H) 6 - 20 mg/dL   Creatinine, Ser 6.21 (H) 0.61 - 1.24 mg/dL   Calcium 7.5 (L) 8.9 - 10.3 mg/dL   Total Protein 6.0 (L) 6.5 - 8.1 g/dL   Albumin 3.4 (L) 3.5 - 5.0 g/dL   AST 23 15 - 41 U/L   ALT 28 17 - 63 U/L   Alkaline Phosphatase 39 38 - 126 U/L   Total Bilirubin 1.2 0.3 - 1.2 mg/dL   GFR calc non Af Amer 40 (L) >60 mL/min   GFR calc Af Amer 46 (L) >60 mL/min   Anion gap 11 5 - 15  Lipase, blood  Result Value Ref Range   Lipase 14 11 - 51 U/L  Lactic acid, plasma  Result Value Ref Range   Lactic Acid, Venous 2.5 (HH) 0.5 - 1.9 mmol/L  Troponin I  Result Value Ref Range   Troponin I <0.03 <0.03 ng/mL   Dg Cervical Spine Complete Result Date: 01/14/2016 CLINICAL DATA:  Larey Seat at home this morning. Neck pain. Initial encounter. EXAM: CERVICAL SPINE - COMPLETE 4+ VIEW COMPARISON:  None. FINDINGS: There is no evidence of cervical spine fracture or prevertebral soft tissue swelling. Alignment is normal. Moderate to severe degenerative disc disease is seen from levels of C4-C7. No other significant bone abnormality identified. IMPRESSION: No acute findings.  Degenerative spondylosis, as described above. Electronically Signed   By: Myles Rosenthal M.D.   On: 01/14/2016 09:20   Dg Thoracic Spine 4v Result Date: 01/14/2016 CLINICAL DATA:  Thoracic spine pain after fall at home today. EXAM: THORACIC SPINE - 4+ VIEW COMPARISON:  Radiographs of  Aug 17, 2006. FINDINGS: No fracture or spondylolisthesis is noted. Minimal anterior osteophyte formation is noted at multiple levels. Disc spaces appear to be well maintained. IMPRESSION: No acute abnormality seen in the thoracic spine. Electronically Signed   By: Lupita Raider, M.D.   On: 01/14/2016 09:20   Ct Head Wo Contrast Result Date: 01/14/2016 CLINICAL DATA:  Head laceration after fall today. EXAM: CT HEAD WITHOUT CONTRAST TECHNIQUE: Contiguous axial images were obtained from the base of the skull through the vertex without intravenous contrast. COMPARISON:  None. FINDINGS: Brain: No mass effect or midline shift is noted. Ventricular size is within normal limits. There is no evidence of mass lesion, hemorrhage or acute infarction. Vascular: Atherosclerosis of basilar artery is noted. Skull: Bony calvarium appears intact. Sinuses/Orbits: Visualized paranasal sinuses appear normal. Other: Surgical staples are noted in the posterior scalp tissues. IMPRESSION: No acute intracranial abnormality seen. Electronically Signed   By: Lupita Raider, M.D.   On: 01/14/2016 09:13   Dg Lumbar Spine 2-3vclearing Result Date: 01/14/2016 CLINICAL DATA:  Fall this morning. Low back pain. Initial encounter. EXAM: LIMITED LUMBAR SPINE FOR TRAUMA CLEARING - 2-3 VIEW COMPARISON:  None. FINDINGS: AP and lateral views show no evidence of lumbar spine fracture or subluxation. Severe degenerative disc disease is seen at levels of L4-5 and L5-S1. IMPRESSION: No acute findings.  Severe lower lumbar degenerative disc disease. Electronically Signed   By: Myles Rosenthal M.D.   On: 01/14/2016 09:22      Samuel Jester, DO 01/15/16 4098

## 2016-01-14 NOTE — ED Triage Notes (Signed)
Pt c/o red raised rash all over body and states his legs got weak and he fell; pt has laceration to back of head; cbg en route 114

## 2016-01-14 NOTE — Evaluation (Signed)
Physical Therapy Evaluation Patient Details Name: Thomas [name] MRN: [id number] DOB: 01-08-1956 Today's Date: 01/14/2016   History of Present Illness  60 y.o. male, With history of habitus mellitus type II, hypertension, dyslipidemia who had diarrhea 2 days ago which resolved by itself, this happened after he ate a sandwich at a restaurant, his diarrhea improved but about 24 hours later he woke up early this morning 3 AM with intense itching all over, he also fell while getting up and hit his head and had a small laceration in the left occipital area. He came to the ER.  In the ER he was found to be dehydrated, ARF, left occipital scalp laceration, also had evidence of hives and I was called to admit. Patient denies any changes in medications, detergent, soap, pets, exposure to sick contacts, travel, outdoor activity or exposure to poison ivy. Only inciting factor seems to be the sandwich he ate.  Clinical Impression  Pt received sitting up in the chair, and is agreeable to PT evaluation.  Dtr present with him.  Pt states he is normally modified independent for ambulation with a cane, and is independent with ADL's.  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  Pt ambulated 5200ft with cane and no LOB at normal gait speed of 2.3030ft/sec.  Pt does not demonstrate need for skilled PT at this time.  PT will sign off.      Follow Up Recommendations No PT follow up    Equipment Recommendations  None recommended by PT    Recommendations for Other Services       Precautions / Restrictions Precautions Precautions: Fall Precaution Comments: reason for admission.  Restrictions Weight Bearing Restrictions: No      Mobility  Bed Mobility Overal bed mobility:  (not observed - pt already up in the chair.  Pt reports he did it independently.  )                Transfers Overall transfer level: Modified independent Equipment used: Straight cane Transfers: Sit to/from Stand               Ambulation/Gait Ambulation/Gait assistance: Modified independent (Device/Increase time) Ambulation Distance (Feet): 500 Feet Assistive device: Straight cane Gait Pattern/deviations: WFL(Within Functional Limits) Gait velocity: 2.2430ft/sec  Gait velocity interpretation: at or above normal speed for age/gender    Stairs            Wheelchair Mobility    Modified Rankin (Stroke Patients Only)       Balance Overall balance assessment: Modified Independent         Standing balance support: Single extremity supported Standing balance-Leahy Scale: Good                               Pertinent Vitals/Pain Pain Assessment: No/denies pain    Home Living Family/patient expects to be discharged to:: Private residence Living Arrangements: Spouse/significant other   Type of Home: House Home Access: Ramped entrance     Home Layout: One level Home Equipment: Cane - single point      Prior Function Level of Independence: Independent with assistive device(s)         Comments: Pt uses a cane for ambulation, but is independent for all ADL's.       Hand Dominance        Extremity/Trunk Assessment   Upper Extremity Assessment: Overall WFL for tasks assessed  Lower Extremity Assessment: Overall WFL for tasks assessed (Pt reports history of R foot/LE numbness since one of his back surgeries in the 90's. )         Communication   Communication: No difficulties  Cognition Arousal/Alertness: Awake/alert Behavior During Therapy: WFL for tasks assessed/performed Overall Cognitive Status: Within Functional Limits for tasks assessed                      General Comments      Exercises     Assessment/Plan    PT Assessment Patent does not need any further PT services  PT Problem List            PT Treatment Interventions      PT Goals (Current goals can be found in the Care Plan section)       Frequency      Barriers to discharge        Co-evaluation               End of Session   Activity Tolerance: Patient tolerated treatment well Patient left: in chair;with family/visitor present      Functional Assessment Tool Used: [employer] AM-PAC "6-clicks"  Functional Limitation: Mobility: Walking and moving around Mobility: Walking and Moving Around Current Status [phone]): 0 percent impaired, limited or restricted Mobility: Walking and Moving Around Goal Status [phone]): 0 percent impaired, limited or restricted Mobility: Walking and Moving Around Discharge Status [phone]): 0 percent impaired, limited or restricted    Time: 4132-4401 PT Time Calculation (min) (ACUTE ONLY): 12 min   Charges:   PT Evaluation $PT Eval Low Complexity: 1 Procedure     PT G Codes:   PT G-Codes **NOT FOR INPATIENT CLASS** Functional Assessment Tool Used: [employer] "6-clicks"  Functional Limitation: Mobility: Walking and moving around Mobility: Walking and Moving Around Current Status [phone]): 0 percent impaired, limited or restricted Mobility: Walking and Moving Around Goal Status [phone]): 0 percent impaired, limited or restricted Mobility: Walking and Moving Around Discharge Status [phone]): 0 percent impaired, limited or restricted    Beth Aleyza Salmi, PT, DPT X: [id number]

## 2016-01-14 NOTE — ED Provider Notes (Addendum)
[facility]-EMERGENCY DEPT Provider Note   CSN: [id number] Arrival date & time: 01/14/16  0529  Time seen 05:45 AM   History   Chief Complaint Chief Complaint  Patient presents with  . Allergic Reaction    HPI Thomas [name] is a 60 y.o. male.  HPI  patient reports he has had nausea the past 2 days and hasn't been eating well. He states he woke up at 3 AM this morning and had itching on his legs which has now spread up into his abdomen with a urticarial type rash. He denies being on any new medication or having any changes in his dosage of medication. He further denies any change in soaps or detergents or eating any different types of foods. He states when he got up he got weak and he fell and he hit the back of his head on a chair rail molding causing a laceration. He did not have loss of consciousness. He denies any injury other than a laceration to the scalp from that fall. He states his last tetanus was 5 years ago. EMS was called and he was given Benadryl which only helped a little bit with the itching. He denies any difficulty swallowing or breathing or swelling of his lips or tongue or any stroke. He states he's never had a reaction like this before.  PCP Dr Linnell Fulling  Past Medical History:  Diagnosis Date  . Diabetes mellitus without complication (HCC)   . Hypercholesterolemia   . Hypertension   Heart murmur that's being monitored, he does not need SBE prophylaxis  There are no active problems to display for this patient.   Past Surgical History:  Procedure Laterality Date  . BACK SURGERY         Home Medications    Prior to Admission medications   Medication Sig Start Date End Date Taking? Authorizing Provider  HYDROcodone-acetaminophen (NORCO) 10-325 MG tablet Take 1 tablet by mouth every 6 (six) hours as needed.   Yes Historical Provider, MD  lisinopril (PRINIVIL,ZESTRIL) 10 MG tablet Take 10 mg by mouth 2 (two) times daily.   Yes Historical Provider, MD    oxyCODONE (OXYCONTIN) 20 mg 12 hr tablet Take 20 mg by mouth every 12 (twelve) hours.   Yes Historical Provider, MD  simvastatin (ZOCOR) 40 MG tablet Take 40 mg by mouth daily.   Yes Historical Provider, MD    Family History History reviewed. No pertinent family history.  Social History Social History  Substance Use Topics  . Smoking status: Former Games developer  . Smokeless tobacco: Never Used  . Alcohol use No  on disability for his back (surgery x 3) Lives at home Lives with spouse   Allergies   Review of patient's allergies indicates not on file.   Review of Systems Review of Systems  All other systems reviewed and are negative.    Physical Exam Updated Vital Signs BP 91/62   Pulse 81   Temp 97.7 F (36.5 C) (Oral)   Resp 23   Ht 6' (1.829 m)   Wt 230 lb (104.3 kg)   SpO2 98%   BMI 31.19 kg/m   Vital signs normal except for hypotension   Physical Exam  Constitutional: He is oriented to person, place, and time. He appears well-developed and well-nourished.  Non-toxic appearance. He does not appear ill. No distress.  HENT:  Head: Normocephalic.    Right Ear: External ear normal.  Left Ear: External ear normal.  Nose: Nose normal. No mucosal  edema or rhinorrhea.  Mouth/Throat: Oropharynx is clear and moist and mucous membranes are normal. No dental abscesses or uvula swelling.  Patient has a 3 cm laceration on his posterior scalp just to the right of midline.  Eyes: Conjunctivae and EOM are normal. Pupils are equal, round, and reactive to light.  Neck: Normal range of motion and full passive range of motion without pain. Neck supple.  Cardiovascular: Normal rate and regular rhythm.  Exam reveals no gallop and no friction rub.   Murmur heard.  Systolic murmur is present  Patient has a high-pitched murmur heard best in the left upper sternal border  Pulmonary/Chest: Effort normal and breath sounds normal. No respiratory distress. He has no wheezes. He has no  rhonchi. He has no rales. He exhibits no tenderness and no crepitus.  Abdominal: Soft. Normal appearance and bowel sounds are normal. He exhibits no distension. There is no tenderness. There is no rebound and no guarding.  Musculoskeletal: Normal range of motion. He exhibits no edema or tenderness.  Moves all extremities well.   Neurological: He is alert and oriented to person, place, and time. He has normal strength. No cranial nerve deficit.  Skin: Skin is warm, dry and intact. No rash noted. No erythema. No pallor.  Patient's noted to have diffuse urticarial, sometimes: Seen rash on his lower extremities, his abdomen and starting to involve his lower chest.  Psychiatric: He has a normal mood and affect. His speech is normal and behavior is normal. His mood appears not anxious.  Nursing note and vitals reviewed.      ED Treatments / Results  Labs (all labs ordered are listed, but only abnormal results are displayed) Labs Reviewed - No data to display  EKG  EKG Interpretation None       Radiology No results found.  Procedures Procedures (including critical care time)  LACERATION REPAIR Performed by: Ward GivensIva L Johntay Doolen Authorized by: Ward GivensIva L Bevan Disney Consent: Verbal consent obtained. Risks and benefits: risks, benefits and alternatives were discussed Consent given by: patient Patient identity confirmed: provided demographic data Prepped and Draped in normal sterile fashion Wound explored  Laceration Location: posterior scalp  Laceration Length: 3 cm  No Foreign Bodies seen or palpated  Anesthesia: local infiltration  Local anesthetic: marcaine 0.5%  Anesthetic total: 5 ml  Irrigation method: syringe Amount of cleaning: standard  Skin closure: staples  Number of staples: 4    Patient tolerance: Patient tolerated the procedure well with no immediate complications.   Medications Ordered in ED Medications  methylPREDNISolone sodium succinate (SOLU-MEDROL) 125 mg/2  mL injection 125 mg (125 mg Intravenous Given 01/14/16 0604)  famotidine (PEPCID) IVPB 20 mg premix (0 mg Intravenous Stopped 01/14/16 0639)  bupivacaine (MARCAINE) 0.5 % injection 10 mL (10 mLs Infiltration Given 01/14/16 0604)  sodium chloride 0.9 % bolus 1,000 mL (0 mLs Intravenous Stopped 01/14/16 0639)  hydrOXYzine (ATARAX/VISTARIL) tablet 50 mg (50 mg Oral Given 01/14/16 [id number])     Initial Impression / Assessment and Plan / ED Course  I have reviewed the triage vital signs and the nursing notes.  Pertinent labs & imaging results that were available during my care of the patient were reviewed by me and considered in my medical decision making (see chart for details).  Clinical Course   Patient was given IV Solu-Medrol and IV Pepcid, he had been given Benadryl by EMS. He was prepared to have his laceration of his scalp stapled.  07:00 patient's rash is improving but he has  some mild swelling of his eyelids. He states he is still having itching on his back.  PT was given oral atarax.   07:55AM pt states his itching is gone. C/O upper back pain, has chronic low back pain. He has a linear abrasion in his right flank and is tender to palpation in his lower thoracic/upper lumbar spine. Will send for xrays and while there CT of head.  Discussed possible epinephrine, but with his heart murmer I am reluctant to give it. His wife also does not want to try it. Pt given IV fluids for his hypotension, which may explain why he was weak and fell at home.   Pt turned over to Dr Clarene Duke to get results of his radiology studies.   Final Clinical Impressions(s) / ED Diagnoses   Final diagnoses:  Fall at home  Laceration of occipital region of scalp, initial encounter  Urticarial rash  Allergic reaction, initial encounter   Disposition pending  Devoria Albe, MD, Concha Pyo, MD 01/14/16 1610    Devoria Albe, MD 01/14/16 [phone]

## 2016-01-14 NOTE — ED Notes (Signed)
CRITICAL VALUE ALERT  Critical value received:  Lactic acid 2.5  Date of notification:  01/14/16  Time of notification:  0938  Critical value read back:Yes.    Nurse who received alert:  c Dominga FerryEdwards RN  MD notified (1st page):    Time of first page:    MD notified (2nd page):  Time of second page:  Responding MD:  Dr. Clarene DukeMcmanus  Time MD responded:  [phone]

## 2016-01-15 DIAGNOSIS — E119 Type 2 diabetes mellitus without complications: Secondary | ICD-10-CM | POA: Diagnosis not present

## 2016-01-15 DIAGNOSIS — S0101XA Laceration without foreign body of scalp, initial encounter: Secondary | ICD-10-CM | POA: Diagnosis not present

## 2016-01-15 DIAGNOSIS — Z87891 Personal history of nicotine dependence: Secondary | ICD-10-CM | POA: Diagnosis not present

## 2016-01-15 DIAGNOSIS — I959 Hypotension, unspecified: Secondary | ICD-10-CM | POA: Diagnosis not present

## 2016-01-15 DIAGNOSIS — N179 Acute kidney failure, unspecified: Secondary | ICD-10-CM | POA: Diagnosis not present

## 2016-01-15 DIAGNOSIS — R197 Diarrhea, unspecified: Secondary | ICD-10-CM | POA: Diagnosis not present

## 2016-01-15 DIAGNOSIS — I10 Essential (primary) hypertension: Secondary | ICD-10-CM | POA: Diagnosis not present

## 2016-01-15 DIAGNOSIS — Z7984 Long term (current) use of oral hypoglycemic drugs: Secondary | ICD-10-CM | POA: Diagnosis not present

## 2016-01-15 DIAGNOSIS — T7840XA Allergy, unspecified, initial encounter: Secondary | ICD-10-CM | POA: Diagnosis not present

## 2016-01-15 LAB — BASIC METABOLIC PANEL
ANION GAP: 8 (ref 5–15)
BUN: 27 mg/dL — ABNORMAL HIGH (ref 6–20)
CALCIUM: 7.6 mg/dL — AB (ref 8.9–10.3)
CO2: 22 mmol/L (ref 22–32)
CREATININE: 1.45 mg/dL — AB (ref 0.61–1.24)
Chloride: 105 mmol/L (ref 101–111)
GFR, EST AFRICAN AMERICAN: 59 mL/min — AB (ref >60)
GFR, EST NON AFRICAN AMERICAN: 51 mL/min — AB (ref >60)
GLUCOSE: 211 mg/dL — AB (ref 65–99)
Potassium: 4.3 mmol/L (ref 3.5–5.1)
Sodium: 135 mmol/L (ref 135–145)

## 2016-01-15 LAB — GLUCOSE, CAPILLARY: Glucose-Capillary: 192 mg/dL — ABNORMAL HIGH (ref 65–99)

## 2016-01-15 LAB — URINE CULTURE: Culture: NO GROWTH

## 2016-01-15 LAB — HEMOGLOBIN A1C
HEMOGLOBIN A1C: 8 % — AB (ref 4.8–5.6)
MEAN PLASMA GLUCOSE: 183 mg/dL

## 2016-01-15 MED ORDER — SODIUM CHLORIDE 0.9 % IV SOLN
1.0000 g | Freq: Once | INTRAVENOUS | Status: AC
  Administered 2016-01-15: 1 g via INTRAVENOUS
  Filled 2016-01-15: qty 10

## 2016-01-15 MED ORDER — PREDNISONE 50 MG PO TABS: 50.0000 mg | ORAL_TABLET | Freq: Two times a day (BID) | ORAL | 0 refills | Status: DC | PRN

## 2016-01-15 MED ORDER — LISINOPRIL 10 MG PO TABS: 10.0000 mg | ORAL_TABLET | Freq: Two times a day (BID) | ORAL | Status: DC

## 2016-01-15 MED ORDER — DIPHENHYDRAMINE HCL 25 MG PO CAPS: 25.0000 mg | ORAL_CAPSULE | Freq: Three times a day (TID) | ORAL | 0 refills | Status: DC | PRN

## 2016-01-15 MED ORDER — AMLODIPINE BESYLATE 10 MG PO TABS: 10.0000 mg | ORAL_TABLET | Freq: Every day | ORAL | 0 refills | Status: DC

## 2016-01-15 MED ORDER — RANITIDINE HCL 150 MG PO CAPS: 150.0000 mg | ORAL_CAPSULE | Freq: Two times a day (BID) | ORAL | 0 refills | Status: DC | PRN

## 2016-01-15 MED FILL — predniSONE 50 MG TABS: 50 | 10 days supply | Qty: 20 | Fill #0

## 2016-01-15 MED FILL — raNITIdine HCL 150 MG TABS: 150 | 10 days supply | Qty: 20 | Fill #0

## 2016-01-15 MED FILL — AMLODIPINE BESYLATE 10 MG T: 10 | 30 days supply | Qty: 30 | Fill #0

## 2016-01-15 NOTE — Consult Note (Signed)
   [facility]   01/15/2016  Keene BreathDonald R Golz 1955/10/02 [id number]    Mr. Montez Moritaarter screened for [employer] program for [employer] [employer] employees/dependents with [employer] insurance. Upon calling into room to discuss Link to Wellness, noted Mr. Montez MoritaCarter has been discharged. Will refer for post hospital discharge call on behalf of [facility].   Raiford NobleAtika Hall, MSN-Ed, RN,BSN [facility] Care [facility] Hospital Liaison [phone]

## 2016-01-15 NOTE — Progress Notes (Signed)
Pt's IV catheter removed and intact. Pt's IV site clean dry and intact. Discharge instructions including medications and follow up appointments were reviewed and discussed with patient. Pt verbalized understanding of discharge instructions including medications and follow up appointments. All questions were answered and no further questions at this time. Pt in stable condition and in no acute distress at time of discharge. Pt escorted by RN.  

## 2016-01-15 NOTE — Discharge Instructions (Signed)
Follow with Primary MD Thayer HeadingsMACKENZIE,BRIAN, MD in 5 days   Get CBC, CMP, Ionized calcium, 2 view Chest X ray checked  by Primary MD  in 5  days ( we routinely change or add medications that can affect your baseline labs and fluid status, therefore we recommend that you get the mentioned basic workup next visit with your PCP, your PCP may decide not to get them or add new tests based on their clinical decision)   Activity: As tolerated with Full fall precautions use walker/cane & assistance as needed   Disposition Home     Diet:   Heart Healthy Low carb.  For Heart failure patients - Check your Weight same time everyday, if you gain over 2 pounds, or you develop in leg swelling, experience more shortness of breath or chest pain, call your Primary MD immediately. Follow Cardiac Low Salt Diet and 1.5 lit/day fluid restriction.   On your next visit with your primary care physician please Get Medicines reviewed and adjusted.   Please request your Prim.MD to go over all Hospital Tests and Procedure/Radiological results at the follow up, please get all Hospital records sent to your Prim MD by signing hospital release before you go home.   If you experience worsening of your admission symptoms, develop shortness of breath, life threatening emergency, suicidal or homicidal thoughts you must seek medical attention immediately by calling 911 or calling your MD immediately  if symptoms less severe.  You Must read complete instructions/literature along with all the possible adverse reactions/side effects for all the Medicines you take and that have been prescribed to you. Take any new Medicines after you have completely understood and accpet all the possible adverse reactions/side effects.   Do not drive, operate heavy machinery, perform activities at heights, swimming or participation in water activities or provide baby sitting services if your were admitted for syncope or siezures until you have seen by  Primary MD or a Neurologist and advised to do so again.  Do not drive when taking Pain medications.    Do not take more than prescribed Pain, Sleep and Anxiety Medications  Special Instructions: If you have smoked or chewed Tobacco  in the last 2 yrs please stop smoking, stop any regular Alcohol  and or any Recreational drug use.  Wear Seat belts while driving.   Please note  You were cared for by a hospitalist during your hospital stay. If you have any questions about your discharge medications or the care you received while you were in the hospital after you are discharged, you can call the unit and asked to speak with the hospitalist on call if the hospitalist that took care of you is not available. Once you are discharged, your primary care physician will handle any further medical issues. Please note that NO REFILLS for any discharge medications will be authorized once you are discharged, as it is imperative that you return to your primary care physician (or establish a relationship with a primary care physician if you do not have one) for your aftercare needs so that they can reassess your need for medications and monitor your lab values.

## 2016-01-15 NOTE — Care Management Note (Signed)
Case Management Note  Patient Details  Name: Keene BreathDonald R Jambor MRN: [id number] Date of Birth: 1955/08/05  Subjective/Objective:                  Pt admitted with syncope. Pt discharging home today. Chart reviewed for CM needs. Pt is from home, ind with ADL's. Pt has PCP, transportation and insurance with drug coverage. Pt plans to return home with self care.   Action/Plan: No CM needs identified.   Expected Discharge Date:  01/15/16               Expected Discharge Plan:  Home/Self Care  In-House Referral:  NA  Discharge planning Services  CM Consult  Post Acute Care Choice:  NA Choice offered to:  NA  Status of Service:  Completed, signed off   Malcolm MetroChildress, Kajsa Butrum Demske, RN 01/15/2016, 10:22 AM

## 2016-01-15 NOTE — Discharge Summary (Signed)
[name]:[id number] DOB: 08-27-1955 DOA: 01/14/2016  PCP: Thayer Headings, MD  Admit date: 01/14/2016  Discharge date: 01/15/2016  Admitted From: Home  Disposition:  Home   Recommendations for Outpatient Follow-up:   Follow up with PCP in 1-2 weeks  PCP Please obtain BMP/CBC, 2 view CXR in 1week,  (see Discharge instructions)   PCP Please follow up on the following pending results: follow Calcium levels   Home Health: None   Equipment/Devices: None  Consultations: None Discharge Condition: Stable   CODE STATUS: Full   Diet Recommendation: Heart Healthy Low Carb   Chief Complaint  Patient presents with  . Allergic Reaction     Brief history of present illness from the day of admission and additional interim summary    Thomas [name]  is a 60 y.o. male, With history of DM type II, hypertension, dyslipidemia who had diarrhea 2 days ago which resolved by itself, this happened after he ate a sandwich at a restaurant, his diarrhea improved but about 24 hours later he woke up early this morning 3 AM with intense itching all over, he also fell while getting up and hit his head and had a small laceration in the left occipital area. He came to the ER.  In the ER he was found to be dehydrated, ARF, left occipital scalp laceration, also had evidence of hives and I was called to admit. Patient denies any changes in medications, detergent, soap, pets, exposure to sick contacts, travel, outdoor activity or exposure to poison ivy. Only inciting factor seems to be the sandwich he ate. He did not have any facial or tongue involvement.  He is currently feeling better, no fever or chills, no headache, no focal weakness, no chest pain or abdominal pain, diarrhea resolved about 36 hours ago, no blood in stool or urine,  no joint pains or aches.   Hospital issues addressed     1. Syncope likely due to dehydration caused by diarrhea with hypotension and ARF. Much improved after IV fluids for hydration, he is close to baseline, creatinine has come down nicely, we will hold lisinopril upon discharge as creatinine level is still higher than normal range, Questran to follow with PCP in 5 days for repeat CBC and CMP.  2. Hives with intense itching. Likely allergic reaction to the sandwich which caused both the diarrhea and allergic reaction. All resolved after Solumedrol Medrol, Benadryl and H2 blocker. We will give him when necessary prednisone, Benadryl and Zantac. Clear trigger still unidentified.  3. DM type II. Check A1c, and a new home medications.  Lab Results  Component Value Date   HGBA1C 8.0 (H) 01/14/2016    4. Hypothyroidism. Continue home dose Synthroid TSH is stable.  5. Dyslipidemia. For now hold statin.  6. Hypertension. Due to ARF ACE inhibitor switch to Norvasc. PCP to monitor.  7. Incidental finding of hypocalcemia. Calcium replaced IV, request PCP to recheck ionized calcium in 5-7 days and do appropriate outpatient workup as needed if still low.   Discharge diagnosis  Principal Problem:   Syncope Active Problems:   AKI (acute kidney injury) (HCC)   Hives   DM2 (diabetes mellitus, type 2) (HCC)   Allergic reaction   Diarrhea   Hypotension    Discharge instructions    Discharge Instructions    Discharge instructions    Complete by:  As directed    Follow with Primary MD Thayer Headings, MD in 5 days   Get CBC, CMP, Ionized calcium, 2 view Chest X ray checked  by Primary MD  in 5  days ( we routinely change or add medications that can affect your baseline labs and fluid status, therefore we recommend that you get the mentioned basic workup next visit with your PCP, your PCP may decide not to get them or add new tests based on their clinical decision)   Activity: As  tolerated with Full fall precautions use walker/cane & assistance as needed   Disposition Home     Diet:   Heart Healthy Low carb.  For Heart failure patients - Check your Weight same time everyday, if you gain over 2 pounds, or you develop in leg swelling, experience more shortness of breath or chest pain, call your Primary MD immediately. Follow Cardiac Low Salt Diet and 1.5 lit/day fluid restriction.   On your next visit with your primary care physician please Get Medicines reviewed and adjusted.   Please request your Prim.MD to go over all Hospital Tests and Procedure/Radiological results at the follow up, please get all Hospital records sent to your Prim MD by signing hospital release before you go home.   If you experience worsening of your admission symptoms, develop shortness of breath, life threatening emergency, suicidal or homicidal thoughts you must seek medical attention immediately by calling 911 or calling your MD immediately  if symptoms less severe.  You Must read complete instructions/literature along with all the possible adverse reactions/side effects for all the Medicines you take and that have been prescribed to you. Take any new Medicines after you have completely understood and accpet all the possible adverse reactions/side effects.   Do not drive, operate heavy machinery, perform activities at heights, swimming or participation in water activities or provide baby sitting services if your were admitted for syncope or siezures until you have seen by Primary MD or a Neurologist and advised to do so again.  Do not drive when taking Pain medications.    Do not take more than prescribed Pain, Sleep and Anxiety Medications  Special Instructions: If you have smoked or chewed Tobacco  in the last 2 yrs please stop smoking, stop any regular Alcohol  and or any Recreational drug use.  Wear Seat belts while driving.   Please note  You were cared for by a hospitalist  during your hospital stay. If you have any questions about your discharge medications or the care you received while you were in the hospital after you are discharged, you can call the unit and asked to speak with the hospitalist on call if the hospitalist that took care of you is not available. Once you are discharged, your primary care physician will handle any further medical issues. Please note that NO REFILLS for any discharge medications will be authorized once you are discharged, as it is imperative that you return to your primary care physician (or establish a relationship with a primary care physician if you do not have one) for your aftercare needs so that they can reassess your need for medications and monitor your  lab values.   Increase activity slowly    Complete by:  As directed       Discharge Medications     Medication List    STOP taking these medications   naproxen 500 MG tablet Commonly known as:  NAPROSYN   potassium chloride 10 MEQ CR capsule Commonly known as:  MICRO-K     TAKE these medications   amLODipine 10 MG tablet Commonly known as:  NORVASC Take 1 tablet (10 mg total) by mouth daily.   diphenhydrAMINE 25 mg capsule Commonly known as:  BENADRYL Take 1 capsule (25 mg total) by mouth every 8 (eight) hours as needed for itching or allergies.   FARXIGA 5 MG Tabs tablet Generic drug:  dapagliflozin propanediol Take 5 mg by mouth daily.   Fish Oil 1000 MG Caps Take 1 capsule by mouth 2 (two) times daily.   glyBURIDE-metformin 5-500 MG tablet Commonly known as:  GLUCOVANCE Take 2 tablets by mouth 2 (two) times daily with a meal.   HYDROcodone-acetaminophen 10-325 MG tablet Commonly known as:  NORCO Take 1 tablet by mouth every 6 (six) hours as needed.   levothyroxine 150 MCG tablet Commonly known as:  SYNTHROID, LEVOTHROID Take 150 mcg by mouth daily before breakfast.   lisinopril 10 MG tablet Commonly known as:  PRINIVIL,ZESTRIL Take 1 tablet  (10 mg total) by mouth 2 (two) times daily. Start taking on:  01/19/2016   nortriptyline 50 MG capsule Commonly known as:  PAMELOR Take 150 mg by mouth at bedtime.   OXYCONTIN 20 mg 12 hr tablet Generic drug:  oxyCODONE Take 20 mg by mouth every 12 (twelve) hours.   predniSONE 50 MG tablet Commonly known as:  DELTASONE Take 1 tablet (50 mg total) by mouth 2 (two) times daily as needed (allergies).   ranitidine 150 MG capsule Commonly known as:  ZANTAC Take 1 capsule (150 mg total) by mouth 2 (two) times daily as needed (allergies).   simvastatin 40 MG tablet Commonly known as:  ZOCOR Take 40 mg by mouth daily.   TRULICITY [place] Inject 1 Units into the skin once a week.   TRULICITY 0.75 MG/0.5ML Sopn Generic drug:  Dulaglutide Inject 0.5 mLs into the skin once a week.       Follow-up Information    MACKENZIE,BRIAN, MD. Schedule an appointment as soon as possible for a visit in 5 day(s).   Specialty:  Internal Medicine Contact information: [address] [place] [address] [phone]           Major procedures and Radiology Reports - PLEASE review detailed and final reports thoroughly  -        Dg Cervical Spine Complete  Result Date: 01/14/2016 CLINICAL DATA:  Larey Seat at home this morning. Neck pain. Initial encounter. EXAM: CERVICAL SPINE - COMPLETE 4+ VIEW COMPARISON:  None. FINDINGS: There is no evidence of cervical spine fracture or prevertebral soft tissue swelling. Alignment is normal. Moderate to severe degenerative disc disease is seen from levels of C4-C7. No other significant bone abnormality identified. IMPRESSION: No acute findings.  Degenerative spondylosis, as described above. Electronically Signed   By: Myles Rosenthal M.D.   On: 01/14/2016 09:20   Dg Thoracic Spine 4v  Result Date: 01/14/2016 CLINICAL DATA:  Thoracic spine pain after fall at home today. EXAM: THORACIC SPINE - 4+ VIEW COMPARISON:  Radiographs of Aug 17, 2006.  FINDINGS: No fracture or spondylolisthesis is noted. Minimal anterior osteophyte formation is noted at multiple levels. Disc  spaces appear to be well maintained. IMPRESSION: No acute abnormality seen in the thoracic spine. Electronically Signed   By: Lupita RaiderJames  Green Jr, M.D.   On: 01/14/2016 09:20   Ct Head Wo Contrast  Result Date: 01/14/2016 CLINICAL DATA:  Head laceration after fall today. EXAM: CT HEAD WITHOUT CONTRAST TECHNIQUE: Contiguous axial images were obtained from the base of the skull through the vertex without intravenous contrast. COMPARISON:  None. FINDINGS: Brain: No mass effect or midline shift is noted. Ventricular size is within normal limits. There is no evidence of mass lesion, hemorrhage or acute infarction. Vascular: Atherosclerosis of basilar artery is noted. Skull: Bony calvarium appears intact. Sinuses/Orbits: Visualized paranasal sinuses appear normal. Other: Surgical staples are noted in the posterior scalp tissues. IMPRESSION: No acute intracranial abnormality seen. Electronically Signed   By: Lupita RaiderJames  Green Jr, M.D.   On: 01/14/2016 09:13   Dg Lumbar Spine 2-3vclearing  Result Date: 01/14/2016 CLINICAL DATA:  Fall this morning. Low back pain. Initial encounter. EXAM: LIMITED LUMBAR SPINE FOR TRAUMA CLEARING - 2-3 VIEW COMPARISON:  None. FINDINGS: AP and lateral views show no evidence of lumbar spine fracture or subluxation. Severe degenerative disc disease is seen at levels of L4-5 and L5-S1. IMPRESSION: No acute findings.  Severe lower lumbar degenerative disc disease. Electronically Signed   By: Myles RosenthalJohn  Stahl M.D.   On: 01/14/2016 09:22    Micro Results     Recent Results (from the past 240 hour(s))  Urine culture     Status: None (Preliminary result)   Collection Time: 01/14/16  9:48 AM  Result Value Ref Range Status   Specimen Description URINE, CLEAN CATCH  Final   Special Requests NONE  Final   Culture PENDING  Incomplete   Report Status PENDING  Incomplete     Today   Subjective    Sherre ScarletDonald Lozano today has no headache,no chest abdominal pain,no new weakness tingling or numbness, feels much better wants to go home today.     Objective   Blood pressure 134/85, pulse 82, temperature 97.5 F (36.4 C), temperature source Oral, resp. rate 20, height 6' (1.829 m), weight 109.8 kg (242 lb), SpO2 100 %.   Intake/Output Summary (Last 24 hours) at 01/15/16 0928 Last data filed at 01/15/16 0900  Gross per 24 hour  Intake          5068.33 ml  Output             3050 ml  Net          2018.33 ml    Exam Awake Alert, Oriented x 3, No new F.N deficits, Normal affect [place].AT,PERRAL Supple Neck,No JVD, No cervical lymphadenopathy appriciated.  Symmetrical Chest wall movement, Good air movement bilaterally, CTAB RRR,No Gallops,Rubs or new Murmurs, No Parasternal Heave +ve B.Sounds, Abd Soft, Non tender, No organomegaly appriciated, No rebound -guarding or rigidity. No Cyanosis, Clubbing or edema, No new Rash or bruise   Data Review   CBC w Diff:  Lab Results  Component Value Date   WBC 9.0 01/14/2016   HGB 16.9 01/14/2016   HCT 50.4 01/14/2016   PLT 292 01/14/2016   LYMPHOPCT 11 01/14/2016   MONOPCT 4 01/14/2016   EOSPCT 1 01/14/2016   BASOPCT 0 01/14/2016    CMP:  Lab Results  Component Value Date   NA 135 01/15/2016   K 4.3 01/15/2016   CL 105 01/15/2016   CO2 22 01/15/2016   BUN 27 (H) 01/15/2016   CREATININE 1.45 (H) 01/15/2016  PROT 6.0 (L) 01/14/2016   ALBUMIN 3.4 (L) 01/14/2016   BILITOT 1.2 01/14/2016   ALKPHOS 39 01/14/2016   AST 23 01/14/2016   ALT 28 01/14/2016  .   Total Time in preparing paper work, data evaluation and todays exam - 35 minutes  Leroy Sea M.D on 01/15/2016 at 9:28 AM  [facility]   Office  [phone]

## 2016-01-16 ENCOUNTER — Other Ambulatory Visit: Payer: Self-pay

## 2016-01-16 NOTE — Patient Outreach (Signed)
[facility]) Care Management  01/16/2016  Keene BreathDonald R Gaskins 05/05/1955 [id number]   Telephone call for transition of care call.  Member was hospitalized for dehydration, fall with laceration to head, acute renal injury and hives.  No answer and unable to leave message.   Plan to have telephonic RNCM attempt to contact member on 01/19/16 for transition of care call. Dudley MajorMelissa Aranza Geddes RN, [facility]ah[facility]N,CCM Care Management Coordinator-[facility] [phone]

## 2016-01-19 LAB — CULTURE, BLOOD (ROUTINE X 2)
Culture: NO GROWTH
Culture: NO GROWTH

## 2016-01-20 ENCOUNTER — Other Ambulatory Visit: Payer: Self-pay | Admitting: *Deleted

## 2016-01-20 NOTE — Patient Outreach (Addendum)
[facility]) Care Management  01/20/2016  Keene BreathDonald R Byrer [birth year] [id number]  Subjective: Telephone call to patient's home number, no answer, no answering machine / voicemail, and unable to leave a message.   Objective: Per chart review: Patient hospitalized  01/14/16 - 01/15/16 for syncope, dehydration, and acute kidney injury.  Patient also has a history of diabetes and hypertension.   Assessment: Received [facility] Transition of care referral on 01/15/16.    Transition of care follow up pending patient contact.   Plan: RNCM will call patient for 3rd telephonic outreach attempt, transition of care follow up, within 10 business days, if no return call.  Kashlynn Kundert H. [name] RN, BSN, CCM [facility] Care Management [facility] Telephonic CM Phone: [phone] Fax: [phone]

## 2016-01-21 ENCOUNTER — Encounter (HOSPITAL_COMMUNITY): Payer: Self-pay | Admitting: *Deleted

## 2016-01-21 ENCOUNTER — Other Ambulatory Visit: Payer: Self-pay | Admitting: *Deleted

## 2016-01-21 ENCOUNTER — Encounter: Payer: Self-pay | Admitting: *Deleted

## 2016-01-21 ENCOUNTER — Ambulatory Visit: Payer: Self-pay | Admitting: *Deleted

## 2016-01-21 ENCOUNTER — Emergency Department (HOSPITAL_COMMUNITY)
Admission: EM | Admit: 2016-01-21 | Discharge: 2016-01-21 | Disposition: A | Discharge status: Home / Self Care | Payer: 59 | Attending: Emergency Medicine | Admitting: Emergency Medicine

## 2016-01-21 DIAGNOSIS — Z79899 Other long term (current) drug therapy: Secondary | ICD-10-CM | POA: Insufficient documentation

## 2016-01-21 DIAGNOSIS — Z87891 Personal history of nicotine dependence: Secondary | ICD-10-CM | POA: Diagnosis not present

## 2016-01-21 DIAGNOSIS — Z4802 Encounter for removal of sutures: Secondary | ICD-10-CM | POA: Diagnosis not present

## 2016-01-21 DIAGNOSIS — E119 Type 2 diabetes mellitus without complications: Secondary | ICD-10-CM | POA: Insufficient documentation

## 2016-01-21 DIAGNOSIS — I10 Essential (primary) hypertension: Secondary | ICD-10-CM | POA: Diagnosis not present

## 2016-01-21 DIAGNOSIS — Z7984 Long term (current) use of oral hypoglycemic drugs: Secondary | ICD-10-CM | POA: Insufficient documentation

## 2016-01-21 NOTE — Patient Outreach (Signed)
[facility]) Care Management  01/21/2016  Keene BreathDonald R Brunelli November 25, 1955 [id number]   Subjective: Telephone call to patient's home number, no answer, no answering machine / voicemail, and unable to leave a message.   Objective: Per chart review: Patient hospitalized  01/14/16 - 01/15/16 for syncope, dehydration, and acute kidney injury.  Patient also has a history of diabetes and hypertension.   Assessment: Received [facility] Transition of care referral on 01/15/16.  Transition of care follow up pending patient contact.   Plan: RNCM will send patient unsuccessful outreach letter, [facility] pamph[facility], and proceed with case closure, within 10 business days, if no return call.    Aariz Maish H. [name] RN, BSN, CCM [facility] Care Management [facility] Telephonic CM Phone: [phone] Fax: [phone]

## 2016-01-21 NOTE — ED Provider Notes (Signed)
[facility]-EMERGENCY DEPT Provider Note   CSN: [id number] Arrival date & time: 01/21/16  4098     History   Chief Complaint Chief Complaint  Patient presents with  . Suture / Staple Removal    HPI Thomas [name] is a 60 y.o. male.  He presents for staple removal. He has a laceration on the occiput which had staples placed 7 days ago. He denies any problems since then.   The history is provided by the patient.  Suture / Staple Removal     Past Medical History:  Diagnosis Date  . Diabetes mellitus without complication (HCC)   . Hypercholesterolemia   . Hypertension     Patient Active Problem List   Diagnosis Date Noted  . AKI (acute kidney injury) (HCC) 01/14/2016  . Syncope 01/14/2016  . Hives 01/14/2016  . DM2 (diabetes mellitus, type 2) (HCC) 01/14/2016  . Allergic reaction   . Diarrhea   . Hypotension     Past Surgical History:  Procedure Laterality Date  . BACK SURGERY         Home Medications    Prior to Admission medications   Medication Sig Start Date End Date Taking? Authorizing Provider  amLODipine (NORVASC) 10 MG tablet Take 1 tablet (10 mg total) by mouth daily. 01/15/16   Leroy Sea, MD  dapagliflozin propanediol (FARXIGA) 5 MG TABS tablet Take 5 mg by mouth daily.    Historical Provider, MD  diphenhydrAMINE (BENADRYL) 25 mg capsule Take 1 capsule (25 mg total) by mouth every 8 (eight) hours as needed for itching or allergies. 01/15/16   Leroy Sea, MD  Dulaglutide (TRULICITY [place]) Inject 1 Units into the skin once a week.    Historical Provider, MD  Dulaglutide (TRULICITY) 0.75 MG/0.5ML SOPN Inject 0.5 mLs into the skin once a week.    Historical Provider, MD  glyBURIDE-metformin (GLUCOVANCE) 5-500 MG tablet Take 2 tablets by mouth 2 (two) times daily with a meal.    Historical Provider, MD  HYDROcodone-acetaminophen (NORCO) 10-325 MG tablet Take 1 tablet by mouth every 6 (six) hours as needed.    Historical Provider, MD  levothyroxine  (SYNTHROID, LEVOTHROID) 150 MCG tablet Take 150 mcg by mouth daily before breakfast.    Historical Provider, MD  lisinopril (PRINIVIL,ZESTRIL) 10 MG tablet Take 1 tablet (10 mg total) by mouth 2 (two) times daily. 01/19/16   Leroy Sea, MD  nortriptyline (PAMELOR) 50 MG capsule Take 150 mg by mouth at bedtime.    Historical Provider, MD  Omega-3 Fatty Acids (FISH OIL) 1000 MG CAPS Take 1 capsule by mouth 2 (two) times daily.    Historical Provider, MD  oxyCODONE (OXYCONTIN) 20 mg 12 hr tablet Take 20 mg by mouth every 12 (twelve) hours.    Historical Provider, MD  predniSONE (DELTASONE) 50 MG tablet Take 1 tablet (50 mg total) by mouth 2 (two) times daily as needed (allergies). 01/15/16   Leroy Sea, MD  ranitidine (ZANTAC) 150 MG capsule Take 1 capsule (150 mg total) by mouth 2 (two) times daily as needed (allergies). 01/15/16   Leroy Sea, MD  simvastatin (ZOCOR) 40 MG tablet Take 40 mg by mouth daily.    Historical Provider, MD    Family History History reviewed. No pertinent family history.  Social History Social History  Substance Use Topics  . Smoking status: Former Games developer  . Smokeless tobacco: Never Used  . Alcohol use No     Allergies   Other and Penicillins  Review of Systems Review of Systems  All other systems reviewed and are negative.    Physical Exam Updated Vital Signs BP 102/83   Pulse 95   Temp 97.7 F (36.5 C) (Oral)   Resp 18   Ht 6' (1.829 m)   Wt 242 lb (109.8 kg)   SpO2 96%   BMI 32.82 kg/m   Physical Exam  Nursing note and vitals reviewed.  60 year old male, resting comfortably and in no acute distress. Vital signs are normal. Oxygen saturation is 96%, which is normal. Head is normocephalic. Occipital laceration is healing well without signs of infection. 4 staples are present. PERRLA, EOMI. Oropharynx is clear. Neck is nontender and supple without adenopathy or JVD. Back is nontender and there is no CVA tenderness. Lungs  are clear without rales, wheezes, or rhonchi. Chest is nontender. Heart has regular rate and rhythm with 23/6 harsh systolic murmur heard best at the lower left sternal border, but with radiation to the apex and base. Abdomen is soft, flat, nontender without masses or hepatosplenomegaly and peristalsis is normoactive. Extremities have no cyanosis or edema, full range of motion is present. Skin is warm and dry without rash. Neurologic: Mental status is normal, cranial nerves are intact, there are no motor or sensory deficits.   ED Treatments / Results   Procedures Procedures (including critical care time) STAPLE REMOVAL Performed by: UJWJX,BJYNWGLICK,Rosie Torrez  Consent: Verbal consent obtained. Consent given by: patient Required items: required blood products, implants, devices, and special equipment available Time out: Immediately prior to procedure a "time out" was called to verify the correct patient, procedure, equipment, support staff and site/side marked as required.  Location: Occiput  Wound Appearance: clean  staples Removed: 4  Patient tolerance: Patient tolerated the procedure well with no immediate complications.     Medications Ordered in ED Medications - No data to display   Initial Impression / Assessment and Plan / ED Course  I have reviewed the triage vital signs and the nursing notes.   Clinical Course   Staple removal. Old records are reviewed, and he had staples placed on October 18. Original injury was from a fall and he was admitted to the hospital following this. Murmur heard today was identified at that visit as well. He is discharged with routine post staple removal instructions.  Final Clinical Impressions(s) / ED Diagnoses   Final diagnoses:  Encounter for staple removal    New Prescriptions New Prescriptions   No medications on file     Dione Boozeavid Tamecca Artiga, MD 01/21/16 [phone]

## 2016-01-21 NOTE — ED Triage Notes (Signed)
Pt here for staple removal in the back of the head.

## 2016-01-29 MED FILL — NAPROXEN 500 MG TABLET: 500 | 30 days supply | Qty: 60 | Fill #1

## 2016-01-29 MED FILL — DICLOFENAC SODIUM 1% GEL: 1 | 30 days supply | Qty: 200 | Fill #3

## 2016-01-29 MED FILL — SIMVASTATIN 40 MG TABLET: 40 | 90 days supply | Qty: 90 | Fill #1

## 2016-01-30 DIAGNOSIS — M545 Low back pain: Secondary | ICD-10-CM | POA: Diagnosis not present

## 2016-01-30 DIAGNOSIS — I10 Essential (primary) hypertension: Secondary | ICD-10-CM | POA: Diagnosis not present

## 2016-01-30 DIAGNOSIS — E1122 Type 2 diabetes mellitus with diabetic chronic kidney disease: Secondary | ICD-10-CM | POA: Diagnosis not present

## 2016-01-30 DIAGNOSIS — F339 Major depressive disorder, recurrent, unspecified: Secondary | ICD-10-CM | POA: Diagnosis not present

## 2016-01-30 DIAGNOSIS — Z09 Encounter for follow-up examination after completed treatment for conditions other than malignant neoplasm: Secondary | ICD-10-CM | POA: Diagnosis not present

## 2016-01-30 MED FILL — HYDROCODON-APAP 10-325: 10-325 | 30 days supply | Qty: 120 | Fill #0

## 2016-01-30 MED FILL — OxyCONTIN 20 MG T12A: 20 | 30 days supply | Qty: 90 | Fill #0

## 2016-01-30 MED FILL — FARXIGA 10 MG TABLET: 10 | 30 days supply | Qty: 30 | Fill #0

## 2016-02-03 MED FILL — TRULICITY 0.75 MG/0.5 ML PE: 0.75 | 28 days supply | Qty: 2 | Fill #0

## 2016-02-05 ENCOUNTER — Other Ambulatory Visit: Payer: Self-pay | Admitting: *Deleted

## 2016-02-05 NOTE — Patient Outreach (Signed)
[facility] Care Management  02/05/2016  Thomas BreathDonald R [name] 1956/02/25 [id number]   No response from patient outreach attempts, will proceed with case closure.    Objective: Per chart review: Patient hospitalized 01/14/16 - 01/15/16 for syncope, dehydration, and acute kidney injury. Patient also has a history of diabetes and hypertension.   Assessment: Received [facility] Transition of care referral on 01/15/16. Transition of care follow up not completed, patient unable to reach, and will proceed with case closure.   Plan: RNCM will send case closure due to unable to reach request to [facility] AlaminLaura [name] at [facility].     Thomas [name] H. [name] RN, BSN, CCM [facility] Telephonic CM Phone: [phone] Fax: [phone]

## 2016-02-26 MED FILL — LISINOPRIL-HCTZ 20-12.5 MG: 20-12.5 | 90 days supply | Qty: 180 | Fill #0

## 2016-02-26 MED FILL — FARXIGA 10 MG TABLET: 10 | 30 days supply | Qty: 30 | Fill #1

## 2016-02-27 MED FILL — HYDROCODON-APAP 10-325: 10-325 | 30 days supply | Qty: 120 | Fill #0

## 2016-02-27 MED FILL — ONE TOUCH ULTRA TEST STRIPS: 90 days supply | Qty: 100 | Fill #1

## 2016-02-27 MED FILL — OxyCONTIN 20 MG T12A: 20 | 30 days supply | Qty: 90 | Fill #0

## 2016-03-24 DIAGNOSIS — Z0001 Encounter for general adult medical examination with abnormal findings: Secondary | ICD-10-CM | POA: Diagnosis not present

## 2016-03-24 DIAGNOSIS — E1122 Type 2 diabetes mellitus with diabetic chronic kidney disease: Secondary | ICD-10-CM | POA: Diagnosis not present

## 2016-03-24 DIAGNOSIS — Z125 Encounter for screening for malignant neoplasm of prostate: Secondary | ICD-10-CM | POA: Diagnosis not present

## 2016-03-24 DIAGNOSIS — I129 Hypertensive chronic kidney disease with stage 1 through stage 4 chronic kidney disease, or unspecified chronic kidney disease: Secondary | ICD-10-CM | POA: Diagnosis not present

## 2016-03-26 DIAGNOSIS — Z6831 Body mass index (BMI) 31.0-31.9, adult: Secondary | ICD-10-CM | POA: Diagnosis not present

## 2016-03-26 DIAGNOSIS — E669 Obesity, unspecified: Secondary | ICD-10-CM | POA: Diagnosis not present

## 2016-03-26 DIAGNOSIS — M150 Primary generalized (osteo)arthritis: Secondary | ICD-10-CM | POA: Diagnosis not present

## 2016-03-26 DIAGNOSIS — M2550 Pain in unspecified joint: Secondary | ICD-10-CM | POA: Diagnosis not present

## 2016-03-29 MED FILL — KLOR-CON M10 TABLET: 10 | 90 days supply | Qty: 180 | Fill #1

## 2016-03-29 MED FILL — LEVOTHYROXINE 150 MCG TAB: 150 | 90 days supply | Qty: 90 | Fill #1

## 2016-03-30 MED FILL — GLYBURIDE-METFORMIN 5-500 M: 5-500 | 90 days supply | Qty: 360 | Fill #0

## 2016-03-30 MED FILL — NORTRIPTYLINE HCL 50 MG CAP: 50 | 90 days supply | Qty: 270 | Fill #0

## 2016-04-01 MED FILL — OxyCONTIN 20 MG T12A: 20 | 30 days supply | Qty: 90 | Fill #0

## 2016-04-01 MED FILL — HYDROCODON-APAP 10-325: 10-325 | 30 days supply | Qty: 120 | Fill #0

## 2016-04-06 DIAGNOSIS — Z0001 Encounter for general adult medical examination with abnormal findings: Secondary | ICD-10-CM | POA: Diagnosis not present

## 2016-04-06 DIAGNOSIS — Z23 Encounter for immunization: Secondary | ICD-10-CM | POA: Diagnosis not present

## 2016-04-29 MED FILL — FARXIGA 10 MG TABLET: 10 | 30 days supply | Qty: 30 | Fill #2

## 2016-04-29 MED FILL — NAPROXEN 500 MG TABLET: 500 | 30 days supply | Qty: 60 | Fill #2

## 2016-04-29 MED FILL — SIMVASTATIN 40 MG TABLET: 40 | 90 days supply | Qty: 90 | Fill #0

## 2016-04-29 MED FILL — DICLOFENAC SODIUM 1% GEL: 1 | 30 days supply | Qty: 200 | Fill #4

## 2016-04-29 MED FILL — HYDROCODON-APAP 10-325: 10-325 | 30 days supply | Qty: 120 | Fill #0

## 2016-04-29 MED FILL — OxyCONTIN 20 MG T12A: 20 | 30 days supply | Qty: 90 | Fill #0

## 2016-05-27 MED FILL — NAPROXEN 500 MG TABLET: 500 | 30 days supply | Qty: 60 | Fill #3

## 2016-05-27 MED FILL — FARXIGA 10 MG TABLET: 10 | 30 days supply | Qty: 30 | Fill #3

## 2016-05-28 MED FILL — OxyCONTIN 30 MG T12A: 30 | 30 days supply | Qty: 60 | Fill #0

## 2016-05-28 MED FILL — HYDROCODON-APAP 10-325: 10-325 | 30 days supply | Qty: 120 | Fill #0

## 2016-06-03 MED FILL — FREESTYLE LITE TEST STRIP: 90 days supply | Qty: 100 | Fill #0

## 2016-06-03 MED FILL — FREESTYLE LITE METER: 1 days supply | Qty: 1 | Fill #0

## 2016-06-03 MED FILL — FREESTYLE LANCETS: 90 days supply | Qty: 100 | Fill #0

## 2016-06-28 DIAGNOSIS — M2550 Pain in unspecified joint: Secondary | ICD-10-CM | POA: Diagnosis not present

## 2016-06-28 DIAGNOSIS — Z6831 Body mass index (BMI) 31.0-31.9, adult: Secondary | ICD-10-CM | POA: Diagnosis not present

## 2016-06-28 DIAGNOSIS — E669 Obesity, unspecified: Secondary | ICD-10-CM | POA: Diagnosis not present

## 2016-06-28 DIAGNOSIS — M150 Primary generalized (osteo)arthritis: Secondary | ICD-10-CM | POA: Diagnosis not present

## 2016-06-28 MED FILL — HYDROCODON-APAP 10-325: 10-325 | 30 days supply | Qty: 120 | Fill #0

## 2016-06-28 MED FILL — OxyCONTIN 30 MG T12A: 30 | 30 days supply | Qty: 60 | Fill #0

## 2016-06-28 MED FILL — NORTRIPTYLINE HCL 50 MG CAP: 50 | 90 days supply | Qty: 270 | Fill #1

## 2016-06-28 MED FILL — NAPROXEN 500 MG TABLET: 500 | 30 days supply | Qty: 60 | Fill #4

## 2016-06-28 MED FILL — KLOR-CON M10 TABLET: 10 | 90 days supply | Qty: 180 | Fill #0

## 2016-06-28 MED FILL — LISINOPRIL-HCTZ 20-12.5 MG: 20-12.5 | 90 days supply | Qty: 180 | Fill #1

## 2016-06-28 MED FILL — FARXIGA 10 MG TABLET: 10 | 30 days supply | Qty: 30 | Fill #4

## 2016-06-28 MED FILL — LEVOTHYROXINE 150 MCG TAB: 150 | 90 days supply | Qty: 90 | Fill #0

## 2016-06-28 MED FILL — GLYBURIDE-METFORMIN 5-500 M: 5-500 | 90 days supply | Qty: 360 | Fill #1

## 2016-07-23 MED FILL — FARXIGA 10 MG TABLET: 10 | 30 days supply | Qty: 30 | Fill #5

## 2016-07-23 MED FILL — NAPROXEN 500 MG TABLET: 500 | 30 days supply | Qty: 60 | Fill #5

## 2016-07-23 MED FILL — SIMVASTATIN 40 MG TABLET: 40 | 90 days supply | Qty: 90 | Fill #1

## 2016-07-23 MED FILL — DICLOFENAC SODIUM 1% GEL: 1 | 30 days supply | Qty: 200 | Fill #0

## 2016-07-27 MED FILL — HYDROCODON-APAP 10-325: 10-325 | 30 days supply | Qty: 120 | Fill #0

## 2016-07-27 MED FILL — OxyCONTIN 30 MG T12A: 30 | 30 days supply | Qty: 60 | Fill #0

## 2016-08-25 MED FILL — FARXIGA 10 MG TABLET: 10 | 30 days supply | Qty: 30 | Fill #6

## 2016-08-25 MED FILL — NAPROXEN 500 MG TABLET: 500 | 30 days supply | Qty: 60 | Fill #0

## 2016-08-27 MED FILL — OxyCONTIN 30 MG T12A: 30 | 30 days supply | Qty: 60 | Fill #0

## 2016-08-27 MED FILL — HYDROCODON-APAP 10-325: 10-325 | 30 days supply | Qty: 120 | Fill #0

## 2016-09-23 MED FILL — POTASSIUM CL 10 MEQ TAB SA: 10 | 90 days supply | Qty: 180 | Fill #1

## 2016-09-23 MED FILL — DICLOFENAC SODIUM 1% GEL: 1 | 30 days supply | Qty: 200 | Fill #1

## 2016-09-23 MED FILL — LEVOTHYROXINE 150 MCG TAB: 150 | 90 days supply | Qty: 90 | Fill #1

## 2016-09-23 MED FILL — FARXIGA 10 MG TABLET: 10 | 30 days supply | Qty: 30 | Fill #7

## 2016-09-24 MED FILL — GLYBURIDE-METFORMIN 5-500 M: 5-500 | 90 days supply | Qty: 360 | Fill #0

## 2016-09-24 MED FILL — OxyCONTIN 30 MG T12A: 30 | 30 days supply | Qty: 60 | Fill #0

## 2016-09-24 MED FILL — LISINOPRIL-HCTZ 20-12.5 MG: 20-12.5 | 90 days supply | Qty: 180 | Fill #0

## 2016-09-24 MED FILL — NAPROXEN 500 MG TABLET: 500 | 30 days supply | Qty: 60 | Fill #1

## 2016-09-24 MED FILL — NORTRIPTYLINE HCL 50 MG CAP: 50 | 90 days supply | Qty: 270 | Fill #0

## 2016-09-24 MED FILL — HYDROCODON-APAP 10-325: 10-325 | 30 days supply | Qty: 120 | Fill #0

## 2016-09-27 DIAGNOSIS — I129 Hypertensive chronic kidney disease with stage 1 through stage 4 chronic kidney disease, or unspecified chronic kidney disease: Secondary | ICD-10-CM | POA: Diagnosis not present

## 2016-09-27 DIAGNOSIS — E039 Hypothyroidism, unspecified: Secondary | ICD-10-CM | POA: Diagnosis not present

## 2016-09-27 DIAGNOSIS — E1122 Type 2 diabetes mellitus with diabetic chronic kidney disease: Secondary | ICD-10-CM | POA: Diagnosis not present

## 2016-09-27 DIAGNOSIS — E785 Hyperlipidemia, unspecified: Secondary | ICD-10-CM | POA: Diagnosis not present

## 2016-09-27 DIAGNOSIS — Z125 Encounter for screening for malignant neoplasm of prostate: Secondary | ICD-10-CM | POA: Diagnosis not present

## 2016-10-04 DIAGNOSIS — E039 Hypothyroidism, unspecified: Secondary | ICD-10-CM | POA: Diagnosis not present

## 2016-10-04 DIAGNOSIS — E785 Hyperlipidemia, unspecified: Secondary | ICD-10-CM | POA: Diagnosis not present

## 2016-10-04 DIAGNOSIS — I129 Hypertensive chronic kidney disease with stage 1 through stage 4 chronic kidney disease, or unspecified chronic kidney disease: Secondary | ICD-10-CM | POA: Diagnosis not present

## 2016-10-04 DIAGNOSIS — E1122 Type 2 diabetes mellitus with diabetic chronic kidney disease: Secondary | ICD-10-CM | POA: Diagnosis not present

## 2016-10-05 DIAGNOSIS — M2550 Pain in unspecified joint: Secondary | ICD-10-CM | POA: Diagnosis not present

## 2016-10-05 DIAGNOSIS — E669 Obesity, unspecified: Secondary | ICD-10-CM | POA: Diagnosis not present

## 2016-10-05 DIAGNOSIS — M150 Primary generalized (osteo)arthritis: Secondary | ICD-10-CM | POA: Diagnosis not present

## 2016-10-05 DIAGNOSIS — Z6832 Body mass index (BMI) 32.0-32.9, adult: Secondary | ICD-10-CM | POA: Diagnosis not present

## 2016-10-25 MED FILL — DICLOFENAC SODIUM 1% GEL: 1 | 30 days supply | Qty: 200 | Fill #2

## 2016-10-25 MED FILL — SIMVASTATIN 40 MG TABLET: 40 | 90 days supply | Qty: 90 | Fill #0

## 2016-10-25 MED FILL — FREESTYLE LITE TEST STRIP: 90 days supply | Qty: 100 | Fill #1

## 2016-10-26 MED FILL — OxyCONTIN 30 MG T12A: 30 | 30 days supply | Qty: 60 | Fill #0

## 2016-10-26 MED FILL — FARXIGA 10 MG TABLET: 10 | 30 days supply | Qty: 30 | Fill #8

## 2016-10-26 MED FILL — NAPROXEN 500 MG TABLET: 500 | 30 days supply | Qty: 60 | Fill #0

## 2016-10-26 MED FILL — HYDROCODON-APAP 10-325: 10-325 | 30 days supply | Qty: 120 | Fill #0

## 2016-11-24 MED FILL — FARXIGA 10 MG TABLET: 10 | 30 days supply | Qty: 30 | Fill #9

## 2016-11-24 MED FILL — NAPROXEN 500 MG TABLET: 500 | 30 days supply | Qty: 60 | Fill #0

## 2016-11-26 MED FILL — HYDROCODON-APAP 10-325: 10-325 | 30 days supply | Qty: 120 | Fill #0

## 2016-11-26 MED FILL — OxyCONTIN 30 MG T12A: 30 | 30 days supply | Qty: 60 | Fill #0

## 2016-12-24 MED FILL — NORTRIPTYLINE HCL 50 MG CAP: 50 | 90 days supply | Qty: 270 | Fill #1

## 2016-12-24 MED FILL — GLYBURIDE-METFORMIN 5-500 M: 5-500 | 90 days supply | Qty: 360 | Fill #1

## 2016-12-24 MED FILL — NAPROXEN 500 MG TABLET: 500 | 30 days supply | Qty: 60 | Fill #1

## 2016-12-24 MED FILL — FARXIGA 10 MG TABLET: 10 | 30 days supply | Qty: 30 | Fill #10

## 2016-12-24 MED FILL — LISINOPRIL-HCTZ 20-12.5 MG: 20-12.5 | 90 days supply | Qty: 180 | Fill #1

## 2016-12-24 MED FILL — FREESTYLE LANCETS: 90 days supply | Qty: 100 | Fill #1

## 2016-12-24 MED FILL — LEVOTHYROXINE 150 MCG TAB: 150 | 90 days supply | Qty: 90 | Fill #0

## 2016-12-27 MED FILL — KLOR-CON M10 TABLET: 10 | 90 days supply | Qty: 180 | Fill #0

## 2016-12-28 MED FILL — OxyCONTIN 30 MG T12A: 30 | 30 days supply | Qty: 60 | Fill #0

## 2016-12-28 MED FILL — HYDROCODON-APAP 10-325: 10-325 | 30 days supply | Qty: 120 | Fill #0

## 2017-01-11 DIAGNOSIS — E669 Obesity, unspecified: Secondary | ICD-10-CM | POA: Diagnosis not present

## 2017-01-11 DIAGNOSIS — M2550 Pain in unspecified joint: Secondary | ICD-10-CM | POA: Diagnosis not present

## 2017-01-11 DIAGNOSIS — M150 Primary generalized (osteo)arthritis: Secondary | ICD-10-CM | POA: Diagnosis not present

## 2017-01-11 DIAGNOSIS — Z6831 Body mass index (BMI) 31.0-31.9, adult: Secondary | ICD-10-CM | POA: Diagnosis not present

## 2017-01-26 MED FILL — SIMVASTATIN 40 MG TABLET: 40 | 90 days supply | Qty: 90 | Fill #1

## 2017-01-26 MED FILL — FARXIGA 10 MG TABLET: 10 | 30 days supply | Qty: 30 | Fill #11

## 2017-01-26 MED FILL — DICLOFENAC SODIUM 1% GEL: 1 | 30 days supply | Qty: 200 | Fill #3

## 2017-01-28 MED FILL — OxyCONTIN 30 MG T12A: 30 | 30 days supply | Qty: 60 | Fill #0

## 2017-01-28 MED FILL — HYDROCODON-APAP 10-325: 10-325 | 30 days supply | Qty: 120 | Fill #0

## 2017-02-05 ENCOUNTER — Encounter (HOSPITAL_COMMUNITY): Payer: Self-pay | Admitting: Emergency Medicine

## 2017-02-05 ENCOUNTER — Emergency Department (HOSPITAL_COMMUNITY)
Admission: EM | Admit: 2017-02-05 | Discharge: 2017-02-05 | Disposition: A | Discharge status: Home / Self Care | Payer: 59 | Attending: Emergency Medicine | Admitting: Emergency Medicine

## 2017-02-05 DIAGNOSIS — I10 Essential (primary) hypertension: Secondary | ICD-10-CM | POA: Insufficient documentation

## 2017-02-05 DIAGNOSIS — Z79899 Other long term (current) drug therapy: Secondary | ICD-10-CM | POA: Diagnosis not present

## 2017-02-05 DIAGNOSIS — M542 Cervicalgia: Secondary | ICD-10-CM | POA: Insufficient documentation

## 2017-02-05 DIAGNOSIS — E119 Type 2 diabetes mellitus without complications: Secondary | ICD-10-CM | POA: Insufficient documentation

## 2017-02-05 MED ORDER — PREDNISONE 10 MG PO TABS: 20.0000 mg | ORAL_TABLET | Freq: Two times a day (BID) | ORAL | 0 refills | Status: DC

## 2017-02-05 MED ORDER — HYDROMORPHONE HCL 1 MG/ML IJ SOLN
2.0000 mg | Freq: Once | INTRAMUSCULAR | Status: AC
  Administered 2017-02-05: 2 mg via INTRAMUSCULAR
  Filled 2017-02-05: qty 2

## 2017-02-05 NOTE — ED Triage Notes (Signed)
Pt reports 9/10 sharp bilateral shoulder and neck pain. Denies cp. Pt reports no injury. Pt has hx of lower back surgery. Pt has limited movement of arms and turning of head.

## 2017-02-05 NOTE — Discharge Instructions (Signed)
Prednisone as prescribed.  Continue your hydrocodone and oxycodone as previously prescribed.  All up with your primary doctor if you are not improving in the next week to discuss imaging studies or referrals to physical therapy.

## 2017-02-05 NOTE — ED Provider Notes (Signed)
[facility] Provider Note   CSN: [id number] Arrival date & time: 02/05/17  0353     History   Chief Complaint Chief Complaint  Patient presents with  . Neck Pain  . Shoulder Pain    HPI Keene BreathDonald R Drinkard is a 61 y.o. male.  Patient is a 61 year old male with history of chronic low back pain presenting with complaints of neck pain.  He states that several days ago he turned his head, felt a pop, and has been experiencing severe discomfort since.  He denies any radiation of his pain into his arms or legs.  He denies any weakness, numbness, or tingling.  He denies any bowel or bladder issues.  His pain is worse when he turns his head or moves.  He has hydrocodone and oxycodone at home, neither which have helped.   The history is provided by the patient.  Neck Pain   This is a new problem. Episode onset: 3 days ago. The problem occurs constantly. The problem has not changed since onset.The pain is associated with nothing. The pain is present in the generalized neck. The quality of the pain is described as stabbing. The pain is severe. Pertinent negatives include no bowel incontinence, no bladder incontinence, no paresis and no weakness. He has tried nothing for the symptoms.    Past Medical History:  Diagnosis Date  . Diabetes mellitus without complication (HCC)   . Hypercholesterolemia   . Hypertension     Patient Active Problem List   Diagnosis Date Noted  . AKI (acute kidney injury) (HCC) 01/14/2016  . Syncope 01/14/2016  . Hives 01/14/2016  . DM2 (diabetes mellitus, type 2) (HCC) 01/14/2016  . Allergic reaction   . Diarrhea   . Hypotension     Past Surgical History:  Procedure Laterality Date  . BACK SURGERY         Home Medications    Prior to Admission medications   Medication Sig Start Date End Date Taking? Authorizing Provider  amLODipine (NORVASC) 10 MG tablet Take 1 tablet (10 mg total) by mouth daily. 01/15/16    Leroy SeaSingh, Prashant K, MD  dapagliflozin propanediol (FARXIGA) 5 MG TABS tablet Take 5 mg by mouth daily.    [provider]  diphenhydrAMINE (BENADRYL) 25 mg capsule Take 1 capsule (25 mg total) by mouth every 8 (eight) hours as needed for itching or allergies. 01/15/16   Leroy SeaSingh, Prashant K, MD  Dulaglutide (TRULICITY [place]) Inject 1 Units into the skin once a week.    [provider]  Dulaglutide (TRULICITY) 0.75 MG/0.5ML SOPN Inject 0.5 mLs into the skin once a week.    [provider]  glyBURIDE-metformin (GLUCOVANCE) 5-500 MG tablet Take 2 tablets by mouth 2 (two) times daily with a meal.    [provider]  HYDROcodone-acetaminophen (NORCO) 10-325 MG tablet Take 1 tablet by mouth every 6 (six) hours as needed.    [provider]  levothyroxine (SYNTHROID, LEVOTHROID) 150 MCG tablet Take 150 mcg by mouth daily before breakfast.    [provider]  lisinopril (PRINIVIL,ZESTRIL) 10 MG tablet Take 1 tablet (10 mg total) by mouth 2 (two) times daily. 01/19/16   Leroy SeaSingh, Prashant K, MD  nortriptyline (PAMELOR) 50 MG capsule Take 150 mg by mouth at bedtime.    [provider]  Omega-3 Fatty Acids (FISH OIL) 1000 MG CAPS Take 1 capsule by mouth 2 (two) times daily.    [provider]  oxyCODONE (OXYCONTIN) 20  mg 12 hr tablet Take 20 mg by mouth every 12 (twelve) hours.    [provider]  predniSONE (DELTASONE) 50 MG tablet Take 1 tablet (50 mg total) by mouth 2 (two) times daily as needed (allergies). 01/15/16   Leroy Sea, MD  ranitidine (ZANTAC) 150 MG capsule Take 1 capsule (150 mg total) by mouth 2 (two) times daily as needed (allergies). 01/15/16   Leroy Sea, MD  simvastatin (ZOCOR) 40 MG tablet Take 40 mg by mouth daily.    [provider]    Family History No family history on file.  Social History Social History   Tobacco Use  . Smoking status: Never Smoker  . Smokeless tobacco: Never Used    Substance Use Topics  . Alcohol use: Yes    Comment: occ beer   . Drug use: No     Allergies   Other and Penicillins   Review of Systems Review of Systems  Gastrointestinal: Negative for bowel incontinence.  Genitourinary: Negative for bladder incontinence.  Musculoskeletal: Positive for neck pain.  Neurological: Negative for weakness.  All other systems reviewed and are negative.    Physical Exam Updated Vital Signs BP 117/69 (BP Location: Left Arm)   Pulse 91   Temp 98.2 F (36.8 C) (Oral)   Resp 16   Ht 6' (1.829 m)   Wt 102.1 kg (225 lb)   SpO2 100%   BMI 30.52 kg/m   Physical Exam  Constitutional: He is oriented to person, place, and time. He appears well-developed and well-nourished. No distress.  HENT:  Head: Normocephalic and atraumatic.  Neck: Normal range of motion. Neck supple.  There is tenderness to palpation in the soft tissues of the lower cervical region.   Pulmonary/Chest: Effort normal.  Musculoskeletal: Normal range of motion.  Neurological: He is alert and oriented to person, place, and time.  Strength is 5 out of 5 in all 4 extremities.  He is able to flex, extend, and oppose all fingers.  Ulnar and radial pulses are easily palpable bilaterally.  Skin: Skin is warm and dry. He is not diaphoretic.  Nursing note and vitals reviewed.    ED Treatments / Results  Labs (all labs ordered are listed, but only abnormal results are displayed) Labs Reviewed - No data to display  EKG  EKG Interpretation None       Radiology No results found.  Procedures Procedures (including critical care time)  Medications Ordered in ED Medications  HYDROmorphone (DILAUDID) injection 2 mg (not administered)     Initial Impression / Assessment and Plan / ED Course  I have reviewed the triage vital signs and the nursing notes.  Pertinent labs & imaging results that were available during my care of the patient were reviewed by me and considered in  my medical decision making (see chart for details).  Patient with complaints of neck pain that is nonradiating.  His neurologic exam is nonfocal and he has no bowel or bladder complaints.  I suspect a musculoskeletal etiology, possibly a disc, however there are no red flags that would suggest an emergent situation.  He is feeling better after IM Dilaudid.  He has pain pills at home and I will add a course of prednisone.  If he is not improving in the next week, he is to follow-up with his primary doctor to discuss imaging studies or physical therapy.  Final Clinical Impressions(s) / ED Diagnoses   Final diagnoses:  None    ED  Discharge Orders    None       Geoffery Lyonselo, Deangelo Berns, MD 02/05/17 [phone]

## 2017-02-05 NOTE — ED Notes (Signed)
Pt verbalizes understanding of d/c instructions. Pt received prescriptions. Pt ambulatory at d/c with all belongings.  

## 2017-02-22 MED FILL — FARXIGA 10 MG TABLET: 10 | 90 days supply | Qty: 90 | Fill #0

## 2017-02-22 MED FILL — FREESTYLE LITE TEST STRIP: 90 days supply | Qty: 100 | Fill #0

## 2017-02-25 MED FILL — HYDROCODON-APAP 10-325: 10-325 | 30 days supply | Qty: 120 | Fill #0

## 2017-02-25 MED FILL — OxyCONTIN 30 MG T12A: 30 | 30 days supply | Qty: 60 | Fill #0

## 2017-03-30 MED FILL — LEVOTHYROXINE 150 MCG TAB: 150 | 90 days supply | Qty: 90 | Fill #1

## 2017-03-30 MED FILL — KLOR-CON M10 TABLET: 10 | 90 days supply | Qty: 180 | Fill #0

## 2017-03-31 MED FILL — LISINOPRIL-HCTZ 20-12.5 MG: 20-12.5 | 90 days supply | Qty: 180 | Fill #0

## 2017-03-31 MED FILL — HYDROCODON-APAP 10-325: 10-325 | 30 days supply | Qty: 120 | Fill #0

## 2017-03-31 MED FILL — NORTRIPTYLINE HCL 50 MG CAP: 50 | 90 days supply | Qty: 270 | Fill #0

## 2017-03-31 MED FILL — GLYBURIDE-METFORMIN 5-500 M: 5-500 | 90 days supply | Qty: 360 | Fill #0

## 2017-03-31 MED FILL — OxyCONTIN 30 MG T12A: 30 | 30 days supply | Qty: 60 | Fill #0

## 2017-04-07 DIAGNOSIS — E039 Hypothyroidism, unspecified: Secondary | ICD-10-CM | POA: Diagnosis not present

## 2017-04-07 DIAGNOSIS — Z0000 Encounter for general adult medical examination without abnormal findings: Secondary | ICD-10-CM | POA: Diagnosis not present

## 2017-04-07 DIAGNOSIS — E785 Hyperlipidemia, unspecified: Secondary | ICD-10-CM | POA: Diagnosis not present

## 2017-04-07 DIAGNOSIS — Z125 Encounter for screening for malignant neoplasm of prostate: Secondary | ICD-10-CM | POA: Diagnosis not present

## 2017-04-07 DIAGNOSIS — E1122 Type 2 diabetes mellitus with diabetic chronic kidney disease: Secondary | ICD-10-CM | POA: Diagnosis not present

## 2017-04-14 DIAGNOSIS — E785 Hyperlipidemia, unspecified: Secondary | ICD-10-CM | POA: Diagnosis not present

## 2017-04-14 DIAGNOSIS — E1122 Type 2 diabetes mellitus with diabetic chronic kidney disease: Secondary | ICD-10-CM | POA: Diagnosis not present

## 2017-04-14 DIAGNOSIS — I129 Hypertensive chronic kidney disease with stage 1 through stage 4 chronic kidney disease, or unspecified chronic kidney disease: Secondary | ICD-10-CM | POA: Diagnosis not present

## 2017-04-14 DIAGNOSIS — Z0001 Encounter for general adult medical examination with abnormal findings: Secondary | ICD-10-CM | POA: Diagnosis not present

## 2017-04-15 DIAGNOSIS — E669 Obesity, unspecified: Secondary | ICD-10-CM | POA: Diagnosis not present

## 2017-04-15 DIAGNOSIS — Z6830 Body mass index (BMI) 30.0-30.9, adult: Secondary | ICD-10-CM | POA: Diagnosis not present

## 2017-04-15 DIAGNOSIS — M150 Primary generalized (osteo)arthritis: Secondary | ICD-10-CM | POA: Diagnosis not present

## 2017-04-15 DIAGNOSIS — M2550 Pain in unspecified joint: Secondary | ICD-10-CM | POA: Diagnosis not present

## 2017-04-15 DIAGNOSIS — M542 Cervicalgia: Secondary | ICD-10-CM | POA: Diagnosis not present

## 2017-04-27 DIAGNOSIS — I10 Essential (primary) hypertension: Secondary | ICD-10-CM | POA: Diagnosis not present

## 2017-04-27 DIAGNOSIS — E119 Type 2 diabetes mellitus without complications: Secondary | ICD-10-CM | POA: Diagnosis not present

## 2017-04-27 DIAGNOSIS — E785 Hyperlipidemia, unspecified: Secondary | ICD-10-CM | POA: Diagnosis not present

## 2017-04-27 DIAGNOSIS — N182 Chronic kidney disease, stage 2 (mild): Secondary | ICD-10-CM | POA: Diagnosis not present

## 2017-04-27 DIAGNOSIS — E039 Hypothyroidism, unspecified: Secondary | ICD-10-CM | POA: Diagnosis not present

## 2017-04-28 MED FILL — FREESTYLE LANCETS: 90 days supply | Qty: 200 | Fill #0

## 2017-04-28 MED FILL — SIMVASTATIN 40 MG TABLET: 40 | 30 days supply | Qty: 30 | Fill #0

## 2017-04-29 MED FILL — HYDROCODON-APAP 10-325: 10-325 | 30 days supply | Qty: 120 | Fill #0

## 2017-04-29 MED FILL — OxyCONTIN 30 MG T12A: 30 | 30 days supply | Qty: 60 | Fill #0

## 2017-05-25 MED FILL — DICLOFENAC SODIUM 1% GEL: 1 | 30 days supply | Qty: 200 | Fill #4

## 2017-05-25 MED FILL — FREESTYLE LITE TEST STRIP: 90 days supply | Qty: 100 | Fill #0

## 2017-05-25 MED FILL — FARXIGA 10 MG TABLET: 10 | 90 days supply | Qty: 90 | Fill #0

## 2017-05-25 MED FILL — SIMVASTATIN 40 MG TABLET: 40 | 90 days supply | Qty: 90 | Fill #1

## 2017-05-27 MED FILL — OxyCONTIN 30 MG T12A: 30 | 30 days supply | Qty: 60 | Fill #0

## 2017-05-27 MED FILL — HYDROCODON-APAP 10-325: 10-325 | 30 days supply | Qty: 120 | Fill #0

## 2017-06-10 IMAGING — DX DG CERVICAL SPINE COMPLETE 4+V
6 series · 6 of 6 positions shown · non-contrast
Comparison: None.

CLINICAL DATA: Fell at home this morning. Neck pain. Initial
encounter.

EXAM:
CERVICAL SPINE - COMPLETE 4+ VIEW

[c-spine lat]
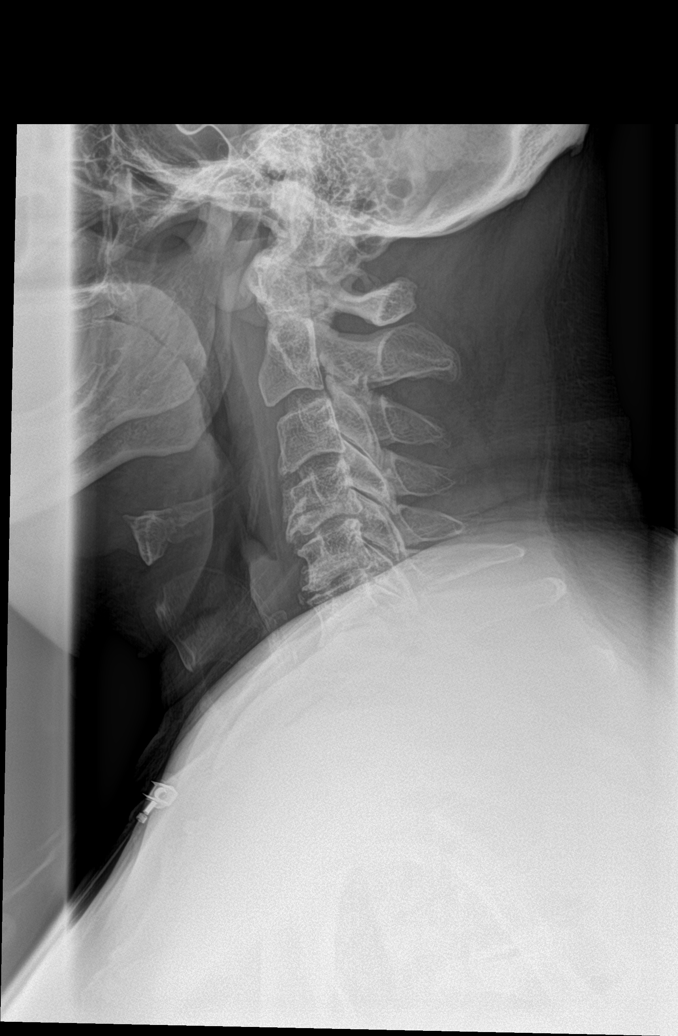

[c-spine obl (1 of 2)]
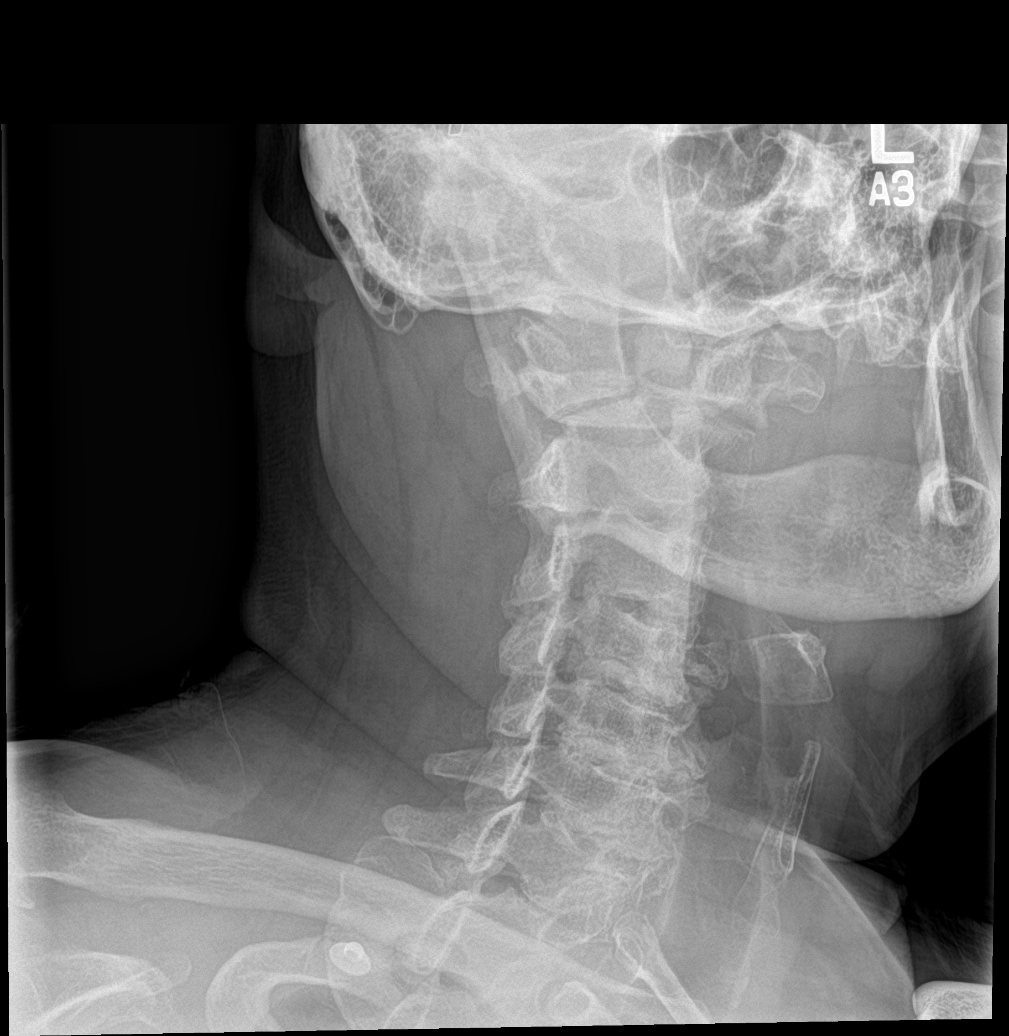

[c-spine obl (2 of 2)]
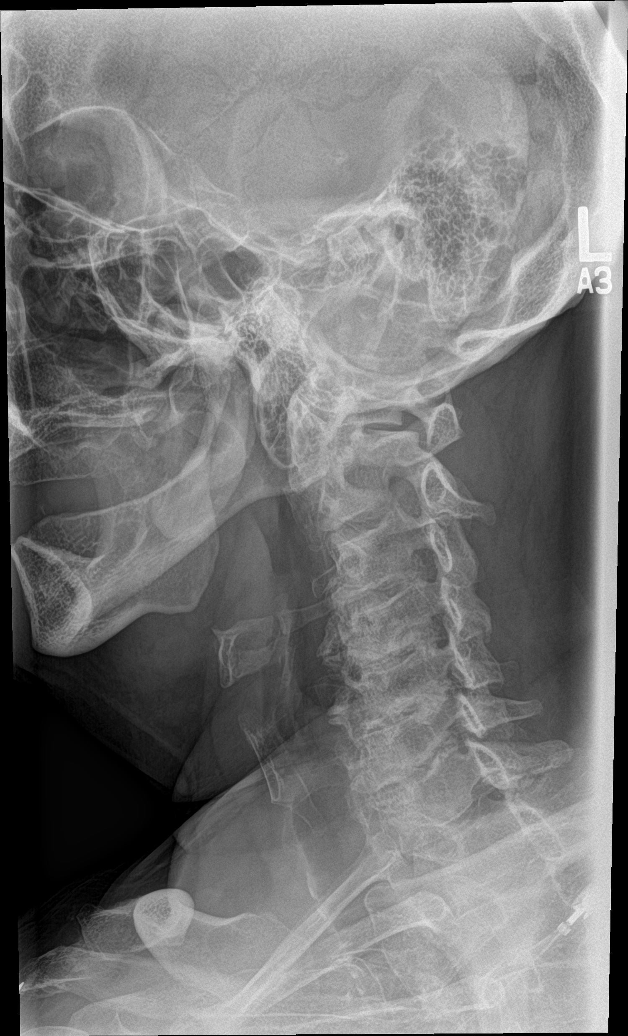

[c-spine ap]
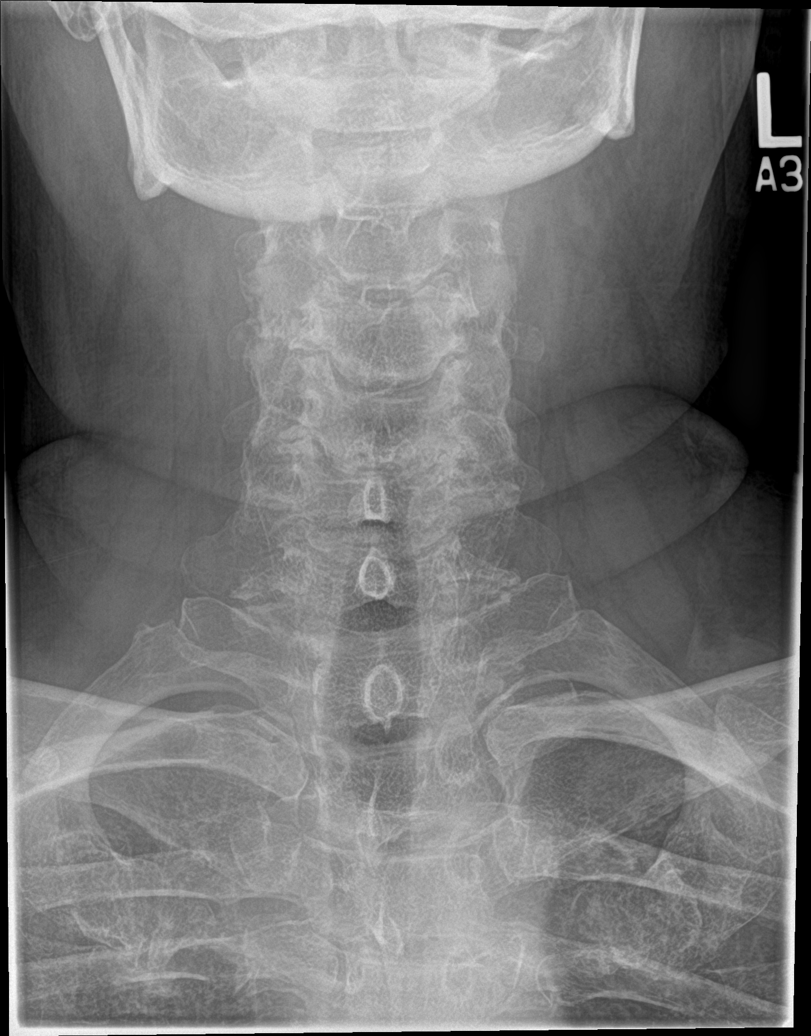

[c-spine open mouth]
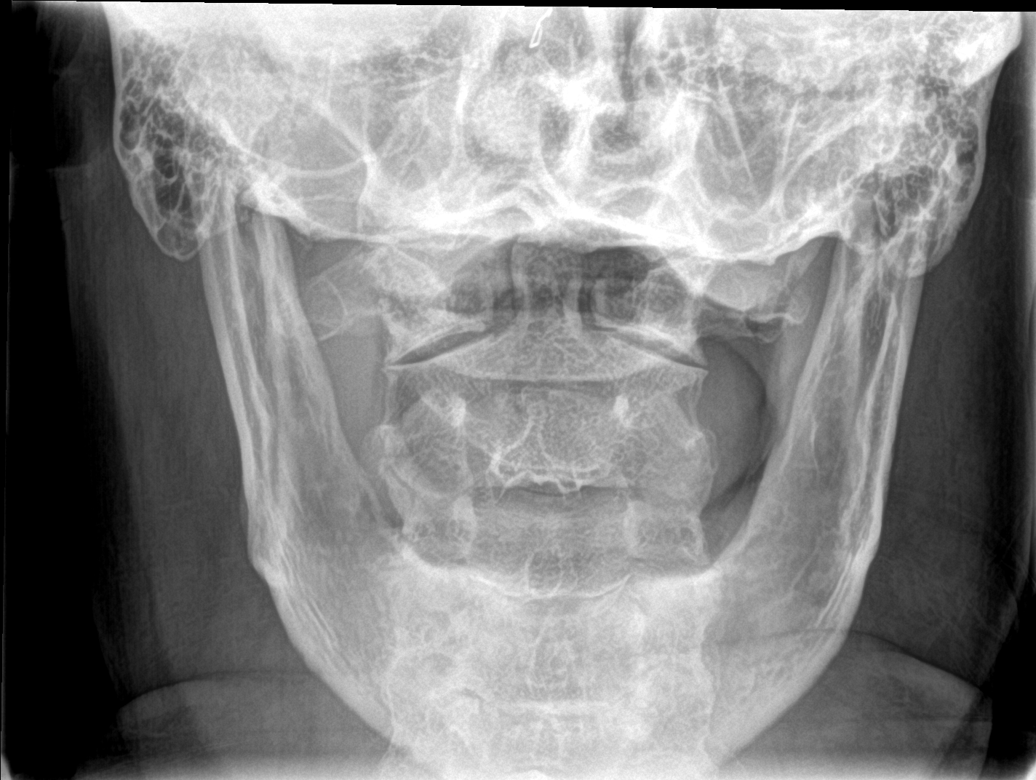

[c-spine swimmers trauma]
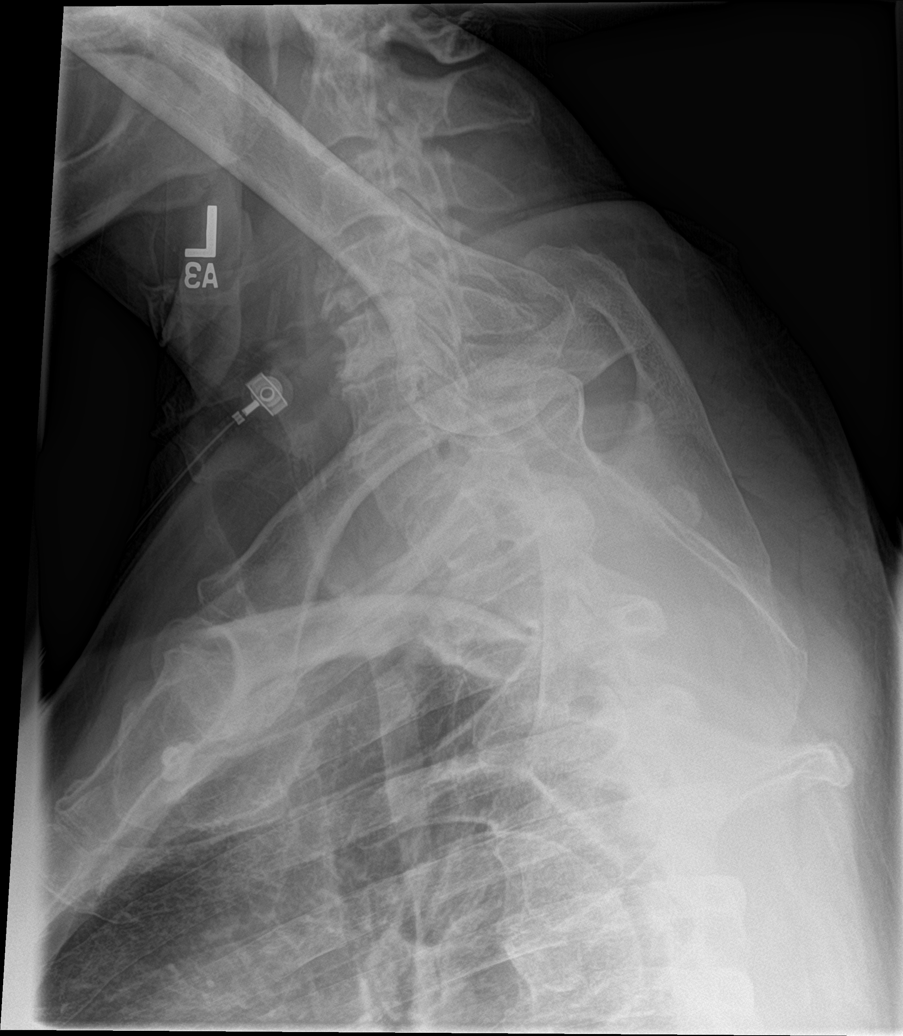

[6 of 6 positions shown; findings below may reference images not displayed]

FINDINGS: There is no evidence of cervical spine fracture or prevertebral soft
tissue swelling. Alignment is normal.

Moderate to severe degenerative disc disease is seen from levels of
C4-C7. No other significant bone abnormality identified.
IMPRESSION: No acute findings.  Degenerative spondylosis, as described above.

## 2017-06-28 MED FILL — GLYBURIDE-METFORMIN 5-500 M: 5-500 | 90 days supply | Qty: 360 | Fill #1

## 2017-06-28 MED FILL — LISINOPRIL-HCTZ 20-12.5 MG: 20-12.5 | 90 days supply | Qty: 180 | Fill #1

## 2017-06-28 MED FILL — NORTRIPTYLINE HCL 50 MG CAP: 50 | 90 days supply | Qty: 270 | Fill #1

## 2017-06-28 MED FILL — POTASSIUM CL 10 MEQ TAB SA: 10 | 90 days supply | Qty: 180 | Fill #1

## 2017-06-29 MED FILL — LEVOTHYROXINE 150 MCG TAB: 150 | 90 days supply | Qty: 90 | Fill #0

## 2017-06-29 MED FILL — OxyCONTIN 30 MG T12A: 30 | 30 days supply | Qty: 60 | Fill #0

## 2017-06-29 MED FILL — HYDROCODON-APAP 10-325: 10-325 | 30 days supply | Qty: 120 | Fill #0

## 2017-07-01 DIAGNOSIS — M542 Cervicalgia: Secondary | ICD-10-CM | POA: Diagnosis not present

## 2017-07-01 DIAGNOSIS — N183 Chronic kidney disease, stage 3 (moderate): Secondary | ICD-10-CM | POA: Diagnosis not present

## 2017-07-01 DIAGNOSIS — Z6829 Body mass index (BMI) 29.0-29.9, adult: Secondary | ICD-10-CM | POA: Diagnosis not present

## 2017-07-01 DIAGNOSIS — M2550 Pain in unspecified joint: Secondary | ICD-10-CM | POA: Diagnosis not present

## 2017-07-01 DIAGNOSIS — E663 Overweight: Secondary | ICD-10-CM | POA: Diagnosis not present

## 2017-07-01 DIAGNOSIS — M150 Primary generalized (osteo)arthritis: Secondary | ICD-10-CM | POA: Diagnosis not present

## 2017-07-20 DIAGNOSIS — E039 Hypothyroidism, unspecified: Secondary | ICD-10-CM | POA: Diagnosis not present

## 2017-07-20 DIAGNOSIS — E119 Type 2 diabetes mellitus without complications: Secondary | ICD-10-CM | POA: Diagnosis not present

## 2017-07-20 DIAGNOSIS — E785 Hyperlipidemia, unspecified: Secondary | ICD-10-CM | POA: Diagnosis not present

## 2017-07-22 ENCOUNTER — Ambulatory Visit: Payer: 59 | Admitting: Family Medicine

## 2017-07-22 ENCOUNTER — Encounter: Payer: Self-pay | Admitting: Family Medicine

## 2017-07-22 VITALS — BP 110/80 | HR 83 | Temp 98.6°F | Ht 72.0 in | Wt 226.0 lb

## 2017-07-22 DIAGNOSIS — E785 Hyperlipidemia, unspecified: Secondary | ICD-10-CM

## 2017-07-22 DIAGNOSIS — E1169 Type 2 diabetes mellitus with other specified complication: Secondary | ICD-10-CM | POA: Diagnosis not present

## 2017-07-22 DIAGNOSIS — E1100 Type 2 diabetes mellitus with hyperosmolarity without nonketotic hyperglycemic-hyperosmolar coma (NKHHC): Secondary | ICD-10-CM

## 2017-07-22 DIAGNOSIS — M549 Dorsalgia, unspecified: Secondary | ICD-10-CM

## 2017-07-22 DIAGNOSIS — I10 Essential (primary) hypertension: Secondary | ICD-10-CM

## 2017-07-22 DIAGNOSIS — E1159 Type 2 diabetes mellitus with other circulatory complications: Secondary | ICD-10-CM | POA: Diagnosis not present

## 2017-07-22 DIAGNOSIS — G8929 Other chronic pain: Secondary | ICD-10-CM

## 2017-07-22 DIAGNOSIS — R011 Cardiac murmur, unspecified: Secondary | ICD-10-CM | POA: Diagnosis not present

## 2017-07-22 DIAGNOSIS — E039 Hypothyroidism, unspecified: Secondary | ICD-10-CM | POA: Diagnosis not present

## 2017-07-22 NOTE — Assessment & Plan Note (Signed)
At goal.  Continue lisinopril 10 mg twice daily.  Obtain most recent records from previous PCP.

## 2017-07-22 NOTE — Patient Instructions (Signed)
It was very nice to meet you today!  We will not make any medication changes.  We will check blood work today.  Come back to see me in 3 months, or sooner as needed.  Take care, Dr Jimmey RalphParker

## 2017-07-22 NOTE — Assessment & Plan Note (Signed)
Stable.  Continue simvastatin 40 mg daily.  Obtain records from previous PCP.

## 2017-07-22 NOTE — Assessment & Plan Note (Signed)
No red flag signs or symptoms.  Defer further management to rheumatologist, Dr. Dierdre ForthBeekman.

## 2017-07-22 NOTE — Assessment & Plan Note (Signed)
Continue levothyroxine 150 mcg daily

## 2017-07-22 NOTE — Progress Notes (Signed)
Subjective:  Thomas [name] is a 62 y.o. male who presents today with a chief complaint of type 2 diabetes and to establish care.   HPI:  Type 2 diabetes, chronic problem, new to this provider Patient with several year history of type 2 diabetes.  He is currently on glyburide-metformin 5-500 mg 2 tablets 2 times daily.  Also on farxiga 5 mg tablet once daily.  He is compliant with all his medications without side effects.  Sugar levels have been stable.  Reports that his last A1c was about 8.13 months ago.  Hypertension, chronic problem, new to this provider Patient currently on lisinopril 10 mg twice daily.  Compliant with this dose without side effects.  Blood pressure stable.  Hyperlipidemia, chronic problem, new to provider Patient currently on simvastatin 40 g daily.  Tolerates this well without side effects.  Cholesterol levels have been stable.  Hypothyroidism, chronic problem, new to this provider Currently on levothyroxine 150 mcg daily.  Chronic back pain/osteoarthritis, chronic problem, new to this provider Sees Dr. Dierdre Forth for this.  Currently on Norco, OxyContin, and Pamelor.  Pain is stable.  ROS: Per HPI, otherwise a complete review of systems was negative.   PMH:  The following were reviewed and entered/updated in epic: Past Medical History:  Diagnosis Date  . Diabetes mellitus without complication (HCC)   . Hypercholesterolemia   . Hypertension    Patient Active Problem List   Diagnosis Date Noted  . Heart murmur 07/22/2017  . Hypertension associated with diabetes (HCC) 07/22/2017  . Hyperlipidemia associated with type 2 diabetes mellitus (HCC) 07/22/2017  . Hypothyroidism 07/22/2017  . Chronic back pain 07/22/2017  . DM2 (diabetes mellitus, type 2) (HCC) 01/14/2016   Past Surgical History:  Procedure Laterality Date  . BACK SURGERY    . HERNIA REPAIR      Family History  Problem Relation Age of Onset  . Diabetes Mother   . Heart attack Mother    . Hypertension Mother   . Arthritis Mother   . Skin cancer Father   . Arthritis Father   . Breast cancer Sister     Medications- reviewed and updated Current Outpatient Medications  Medication Sig Dispense Refill  . dapagliflozin propanediol (FARXIGA) 5 MG TABS tablet Take 5 mg by mouth daily.    Marland Kitchen glyBURIDE-metformin (GLUCOVANCE) 5-500 MG tablet Take 2 tablets by mouth 2 (two) times daily with a meal.    . HYDROcodone-acetaminophen (NORCO) 10-325 MG tablet Take 1 tablet by mouth every 6 (six) hours as needed.    Marland Kitchen levothyroxine (SYNTHROID, LEVOTHROID) 150 MCG tablet Take 150 mcg by mouth daily before breakfast.    . lisinopril (PRINIVIL,ZESTRIL) 10 MG tablet Take 1 tablet (10 mg total) by mouth 2 (two) times daily.    . nortriptyline (PAMELOR) 50 MG capsule Take 150 mg by mouth at bedtime.    . Omega-3 Fatty Acids (FISH OIL) 1000 MG CAPS Take 1 capsule by mouth 2 (two) times daily.    Marland Kitchen oxyCODONE (OXYCONTIN) 20 mg 12 hr tablet Take 20 mg by mouth every 12 (twelve) hours.    . simvastatin (ZOCOR) 40 MG tablet Take 40 mg by mouth daily.    . diclofenac sodium (VOLTAREN) 1 % GEL 2 (two) times daily. as directed  4   No current facility-administered medications for this visit.     Allergies-reviewed and updated Allergies  Allergen Reactions  . Trulicity [Dulaglutide] Hives  . Other     Stomach medicine, not  sure of name. Had a mini stroke  . Penicillins Swelling    Arm swelling Has patient had a PCN reaction causing immediate rash, facial/tongue/throat swelling, SOB or lightheadedness with hypotension: No Has patient had a PCN reaction causing severe rash involving mucus membranes or skin necrosis: No Has patient had a PCN reaction that required hospitalization No Has patient had a PCN reaction occurring within the last 10 years: No If all of the above answers are "NO", then may proceed with Cephalosporin use.    Social History   Socioeconomic History  . Marital status:  Married    Spouse name: Not on file  . Number of children: Not on file  . Years of education: Not on file  . Highest education level: Not on file  Occupational History  . Not on file  Social Needs  . Financial resource strain: Not on file  . Food insecurity:    Worry: Not on file    Inability: Not on file  . Transportation needs:    Medical: Not on file    Non-medical: Not on file  Tobacco Use  . Smoking status: Never Smoker  . Smokeless tobacco: Never Used  Substance and Sexual Activity  . Alcohol use: Never    Frequency: Never    Comment: occ beer   . Drug use: No  . Sexual activity: Not on file  Lifestyle  . Physical activity:    Days per week: Not on file    Minutes per session: Not on file  . Stress: Not on file  Relationships  . Social connections:    Talks on phone: Not on file    Gets together: Not on file    Attends religious service: Not on file    Active member of club or organization: Not on file    Attends meetings of clubs or organizations: Not on file    Relationship status: Not on file  Other Topics Concern  . Not on file  Social History Narrative  . Not on file     Objective:  Physical Exam: BP 110/80 (BP Location: Left Arm)   Pulse 83   Temp 98.6 F (37 C) (Oral)   Ht 6' (1.829 m)   Wt 226 lb (102.5 kg)   SpO2 95%   BMI 30.65 kg/m   Gen: NAD, resting comfortably CV: RRR with no murmurs appreciated Pulm: NWOB, CTAB with no crackles, wheezes, or rhonchi GI: Normal bowel sounds present. Soft, Nontender, Nondistended. MSK: No edema, cyanosis, or clubbing noted Skin: Warm, dry Neuro: Grossly normal, moves all extremities Psych: Normal affect and thought content  Assessment/Plan:  DM2 (diabetes mellitus, type 2) (HCC) Obtain records from previous PCP regarding A1c and preventative diabetes care.  In the interim, we will continue his current regimen of Farxiga and glyburide-metformin.  Follow-up in 3 months.  Hypertension associated with  diabetes (HCC) At goal.  Continue lisinopril 10 mg twice daily.  Obtain most recent records from previous PCP.  Hyperlipidemia associated with type 2 diabetes mellitus (HCC) Stable.  Continue simvastatin 40 mg daily.  Obtain records from previous PCP.  Hypothyroidism Continue levothyroxine 150 mcg daily.  Chronic back pain No red flag signs or symptoms.  Defer further management to rheumatologist, Dr. Dierdre ForthBeekman.  Preventative healthcare Obtain records from previous PCP.  Katina Degreealeb M. Jimmey RalphParker, MD 07/22/2017 1:14 PM

## 2017-07-22 NOTE — Assessment & Plan Note (Signed)
Obtain records from previous PCP regarding A1c and preventative diabetes care.  In the interim, we will continue his current regimen of Farxiga and glyburide-metformin.  Follow-up in 3 months.

## 2017-07-25 MED FILL — DICLOFENAC SODIUM 1% GEL: 1 | 30 days supply | Qty: 200 | Fill #0

## 2017-07-25 MED FILL — FREESTYLE LANCETS: 90 days supply | Qty: 200 | Fill #0

## 2017-07-27 MED FILL — FREESTYLE LITE TEST STRIP: 90 days supply | Qty: 300 | Fill #0

## 2017-07-27 MED FILL — HYDROCODON-APAP 10-325: 10-325 | 30 days supply | Qty: 120 | Fill #0

## 2017-07-27 MED FILL — OxyCONTIN 30 MG T12A: 30 | 30 days supply | Qty: 60 | Fill #0

## 2017-08-25 ENCOUNTER — Other Ambulatory Visit: Payer: Self-pay | Admitting: Family Medicine

## 2017-08-25 ENCOUNTER — Other Ambulatory Visit: Payer: Self-pay

## 2017-08-25 MED ORDER — SIMVASTATIN 40 MG PO TABS: 40.0000 mg | ORAL_TABLET | Freq: Every day | ORAL | 11 refills | Status: DC

## 2017-08-25 MED FILL — SIMVASTATIN 40 MG TABLET: 40 | 30 days supply | Qty: 30 | Fill #0

## 2017-08-25 MED FILL — FARXIGA 10 MG TABLET: 10 | 90 days supply | Qty: 90 | Fill #1

## 2017-08-25 NOTE — Telephone Encounter (Signed)
Copied from CRM [id number]. Topic: Quick Communication - Rx Refill/Question >> Aug 25, 2017  9:09 AM Arlyss Gandy, NT wrote: Medication: simvastatin (ZOCOR) 40 MG tablet   Has the patient contacted their pharmacy? Yes.   (Agent: If no, request that the patient contact the pharmacy for the refill.) (Agent: If yes, when and what did the pharmacy advise?)  Preferred Pharmacy (with phone number or street name): [facility] - [place], [place] - [address]. [phone] (Phone) [phone] (Fax)      Agent: Please be advised that RX refills may take up to 3 business days. We ask that you follow-up with your pharmacy.

## 2017-08-25 NOTE — Telephone Encounter (Signed)
LOV  07/22/17 Dr. Jimmey Ralph No provider name associated with medication.

## 2017-08-25 NOTE — Telephone Encounter (Signed)
See note

## 2017-08-26 MED FILL — HYDROCODON-APAP 10-325: 10-325 | 30 days supply | Qty: 120 | Fill #0

## 2017-08-26 MED FILL — OxyCONTIN 30 MG T12A: 30 | 30 days supply | Qty: 60 | Fill #0

## 2017-09-26 ENCOUNTER — Other Ambulatory Visit: Payer: Self-pay

## 2017-09-26 ENCOUNTER — Telehealth: Payer: Self-pay | Admitting: Family Medicine

## 2017-09-26 MED ORDER — POTASSIUM CHLORIDE ER 10 MEQ PO TBCR: 10.0000 meq | EXTENDED_RELEASE_TABLET | Freq: Two times a day (BID) | ORAL | 1 refills | Status: DC

## 2017-09-26 MED ORDER — LEVOTHYROXINE SODIUM 150 MCG PO TABS: 150.0000 ug | ORAL_TABLET | Freq: Every day | ORAL | 0 refills | Status: DC

## 2017-09-26 MED FILL — LISINOPRIL-HCTZ 20-12.5 MG: 20-12.5 | 90 days supply | Qty: 180 | Fill #2

## 2017-09-26 MED FILL — LEVOTHYROXINE 150 MCG TAB: 150 | 90 days supply | Qty: 90 | Fill #0

## 2017-09-26 MED FILL — GLYBURIDE-METFORMIN 5-500 M: 5-500 | 90 days supply | Qty: 360 | Fill #0

## 2017-09-26 MED FILL — DICLOFENAC SODIUM 1% GEL: 1 | 30 days supply | Qty: 200 | Fill #1

## 2017-09-26 MED FILL — SIMVASTATIN 40 MG TABLET: 40 | 30 days supply | Qty: 30 | Fill #1

## 2017-09-26 MED FILL — NORTRIPTYLINE HCL 50 MG CAP: 50 | 90 days supply | Qty: 270 | Fill #2

## 2017-09-26 MED FILL — KLOR-CON M10 TABLET: 10 | 90 days supply | Qty: 180 | Fill #0

## 2017-09-26 NOTE — Telephone Encounter (Signed)
Ok with me. Please place any necessary orders. 

## 2017-09-26 NOTE — Telephone Encounter (Signed)
Rx sent to pharmacy   

## 2017-09-26 NOTE — Telephone Encounter (Signed)
Patient states he has been taking potassium 10 mEq for "a long time".  States he may have forgotten to tell us that at his visit.  We do not have potassium on his med list.  Please advise.  Ok to send in?

## 2017-09-26 NOTE — Telephone Encounter (Signed)
See note.  Copied from CRM [id number]. Topic: General - Other >> Sep 26, 2017  2:53 PM [name], [name] wrote: Reason for CRM: Patient called stating that the Pharmacy had no order for him to receive potassium prescription.  Patient seems to think that he should have a potassium refill.  Please advise.  CB#[phone].

## 2017-09-27 MED FILL — HYDROCODON-APAP 10-325: 10-325 | 30 days supply | Qty: 120 | Fill #0

## 2017-09-27 MED FILL — OxyCONTIN 30 MG T12A: 30 | 30 days supply | Qty: 60 | Fill #0

## 2017-10-03 ENCOUNTER — Ambulatory Visit: Payer: 59 | Admitting: Family Medicine

## 2017-10-03 ENCOUNTER — Encounter: Payer: Self-pay | Admitting: Family Medicine

## 2017-10-03 VITALS — BP 118/74 | HR 77 | Temp 98.2°F | Ht 72.0 in | Wt 222.4 lb

## 2017-10-03 DIAGNOSIS — E1169 Type 2 diabetes mellitus with other specified complication: Secondary | ICD-10-CM

## 2017-10-03 DIAGNOSIS — E1159 Type 2 diabetes mellitus with other circulatory complications: Secondary | ICD-10-CM | POA: Diagnosis not present

## 2017-10-03 DIAGNOSIS — E785 Hyperlipidemia, unspecified: Secondary | ICD-10-CM

## 2017-10-03 DIAGNOSIS — I10 Essential (primary) hypertension: Secondary | ICD-10-CM

## 2017-10-03 DIAGNOSIS — E1100 Type 2 diabetes mellitus with hyperosmolarity without nonketotic hyperglycemic-hyperosmolar coma (NKHHC): Secondary | ICD-10-CM

## 2017-10-03 DIAGNOSIS — E039 Hypothyroidism, unspecified: Secondary | ICD-10-CM | POA: Diagnosis not present

## 2017-10-03 DIAGNOSIS — Z1159 Encounter for screening for other viral diseases: Secondary | ICD-10-CM | POA: Diagnosis not present

## 2017-10-03 DIAGNOSIS — Z114 Encounter for screening for human immunodeficiency virus [HIV]: Secondary | ICD-10-CM | POA: Diagnosis not present

## 2017-10-03 DIAGNOSIS — I152 Hypertension secondary to endocrine disorders: Secondary | ICD-10-CM

## 2017-10-03 LAB — CBC
HCT: 47.5 % (ref 39.0–52.0)
HEMOGLOBIN: 16.1 g/dL (ref 13.0–17.0)
MCHC: 33.9 g/dL (ref 30.0–36.0)
MCV: 91.4 fl (ref 78.0–100.0)
PLATELETS: 209 10*3/uL (ref 150.0–400.0)
RBC: 5.2 Mil/uL (ref 4.22–5.81)
RDW: 13.8 % (ref 11.5–15.5)
WBC: 8 10*3/uL (ref 4.0–10.5)

## 2017-10-03 LAB — COMPREHENSIVE METABOLIC PANEL
ALK PHOS: 47 U/L (ref 39–117)
ALT: 21 U/L (ref 0–53)
AST: 22 U/L (ref 0–37)
Albumin: 4.5 g/dL (ref 3.5–5.2)
BILIRUBIN TOTAL: 0.5 mg/dL (ref 0.2–1.2)
BUN: 22 mg/dL (ref 6–23)
CALCIUM: 9.5 mg/dL (ref 8.4–10.5)
CO2: 28 mEq/L (ref 19–32)
Chloride: 100 mEq/L (ref 96–112)
Creatinine, Ser: 1.62 mg/dL — ABNORMAL HIGH (ref 0.40–1.50)
GFR: 45.93 mL/min — AB (ref >60.00)
Glucose, Bld: 103 mg/dL — ABNORMAL HIGH (ref 70–99)
Potassium: 4.4 mEq/L (ref 3.5–5.1)
Sodium: 138 mEq/L (ref 135–145)
TOTAL PROTEIN: 7 g/dL (ref 6.0–8.3)

## 2017-10-03 LAB — POCT GLYCOSYLATED HEMOGLOBIN (HGB A1C): Hemoglobin A1C: 6.6 % — AB (ref 4.0–5.6)

## 2017-10-03 LAB — LDL CHOLESTEROL, DIRECT: Direct LDL: 77 mg/dL

## 2017-10-03 LAB — LIPID PANEL
CHOLESTEROL: 134 mg/dL (ref 0–200)
HDL: 32.3 mg/dL — ABNORMAL LOW (ref >39.00)
NONHDL: 101.63
TRIGLYCERIDES: 211 mg/dL — AB (ref 0.0–149.0)
Total CHOL/HDL Ratio: 4
VLDL: 42.2 mg/dL — ABNORMAL HIGH (ref 0.0–40.0)

## 2017-10-03 LAB — TSH: TSH: 1.69 u[IU]/mL (ref 0.35–4.50)

## 2017-10-03 NOTE — Assessment & Plan Note (Signed)
Stable.  Continue levothyroxine 150 mcg daily.  Check TSH today.

## 2017-10-03 NOTE — Assessment & Plan Note (Signed)
A1c stable at 6.6 today.  Continue Farxiga 5 mg daily and glyburide-metformin 5-500 mg tablet 2 tablets twice daily with meals.  Foot exam performed today without abnormality.  Advised patient to have eye exam done soon.

## 2017-10-03 NOTE — Patient Instructions (Addendum)
It was very nice to see you today!  Your a1c today is 6.6. I am very happy with this result. Everything else looks good.  We will not make any medication changes today.  Please keep up the good work.  We will check blood work today.  Let me know if you change your mind about colon cancer screening.  Come back to see me in 6 months, or sooner as needed.   Take care, Dr Jimmey Ralph   Preventing Colorectal Cancer Colorectal cancer is an abnormal growth of cells and tissue (tumor) in the colon or rectum, which are parts of the large intestine. If colorectal cancer is not found or prevented early, it can spread (metastasize) to other parts of the body and can be fatal. You are more likely to develop this condition if:  You are older than age 76.  You have a family history of colorectal cancer or genetic conditions, such as: ? Lynch syndrome. ? Familial adenomatous polyposis. ? Turcot syndrome. ? Peutz-Jeghers syndrome.  You have had cancer before.  You have multiple growths (polyps) in the colon or rectum.  You have certain other conditions, such as an inflammatory bowel disease or Crohn disease.  You are African American.  You have diabetes.  You have an inactive (sedentary) lifestyle.  You eat a diet that is high in red or processed meat and high in fat (especially animal fat).  You eat a diet that is low in sources of fiber, such as fruits, vegetables, and whole grains.  You drink alcohol excessively.  You smoke.  It is important to have colorectal cancer tests (screenings) to check for early signs of cancer. During a screening, polyps may be removed and examined to check for cancer cells and possibly to prevent them from becoming cancerous. In addition to regular screenings, making changes to your diet and lifestyle can help prevent colorectal cancer. What nutrition changes can be made?  Eat plenty of fruits and vegetables. You need 1?2 cups of fruit each day. You also  need 2?3 cups of vegetables each day.  Eat whole grains, which are grains that have not been processed. They include oats, whole wheat, bulgur, brown rice, quinoa, and millet. You should eat 6?8 oz (171-227 g) of grains each day. Use a kitchen scale to measure these amounts.  Eat less red meat. Instead, choose low-fat (lean) sources of protein such as beans, tofu, fish, and chicken.  Avoid processed meat, such as deli meat, bacon, and sausage. Avoid frying and cooking meat at high heat. Use other methods of cooking, such as steaming or sauteing. What lifestyle changes can be made?  Do not use any products that contain nicotine or tobacco, such as cigarettes and e-cigarettes. If you need help quitting, ask your health care provider.  Limit alcohol intake to no more than 1 drink a day for nonpregnant women and 2 drinks a day for men. One drink equals 12 oz of beer, 5 oz of wine, or 1 oz of hard liquor.  If you are overweight or obese, work with your health care provider to lose weight.  Exercise. Aim for at least 150 minutes of moderate exercise (like walking, biking, or yoga) or 75 minutes of vigorous exercise (like running or swimming) each week. Ask your health care provider how much physical activity is best for you.  Have regular screenings as often as recommended by your health care provider. ? Most people with average risk of colorectal cancer should start screening at  age 63. If you have a personal or family history of colorectal cancer, you may need to start screening at a younger age. ? There are several types of screening tests. They include: ? Guaiac-based fecal occult blood testing. ? Fecal immunochemical test (FIT). ? Stool DNA test. ? Barium enema. ? Virtual colonoscopy. ? Sigmoidoscopy. During this test, a flexible tube with a tiny camera (sigmoidoscope) is used to examine your rectum and lower colon. The sigmoidoscope is inserted through your anus into your rectum and lower  colon. ? Colonoscopy. During this test, a long, thin, flexible tube with a tiny camera (colonoscope) is used to examine your entire colon and rectum.  Ask your health care provider if you should take baby aspirin to help prevent polyps.  Do not ignore symptoms. Schedule a visit with your health care provider if you experience: ? Blood in your stool. ? Discomfort, pain, bloating, fullness, or cramps in your abdomen. ? A change in your bowel habits. ? Unexplained weight loss. ? Anemia. ? Nausea and vomiting. ? Constant fatigue.  Find out about your family's medical history. It is important to know whether colorectal cancer is in your family. If you feel that you have a strong family history, ask to meet with a Dentistgenetic counselor. Why are these changes important? Colorectal cancer is a leading cause of death from cancer. Making these changes can help you reduce your risk. Getting recommended screenings means that signs of cancer can be found and treated early. What can happen if changes are not made? If you do not make changes, you may have an increased risk of developing colorectal cancer. If you delay recommended screenings, it is possible that any existing cancer will grow and spread before it is found. Cancer that is larger or has spread is more difficult to treat and cure. Where to find support: To get support for preventing colorectal cancer, consider:  Talking with your health care provider. Ask about screenings and support groups.  Where to find more information: Learn more about colorectal cancer from:  The American Cancer Society: www.cancer.org  The [employer]: www.cancer.gov  Summary  You can reduce your chances of getting colorectal cancer by getting recommended screenings and making certain nutritional and lifestyle changes.  Eat plenty of fruits, vegetables and whole grains. Avoid red meats and processed meats.  Do not use any products that contain  nicotine or tobacco, such as cigarettes and e-cigarettes. If you need help quitting, ask your health care provider.  Aim for at least 150 minutes of moderate exercise or 75 minutes of vigorous exercise each week. Ask your health care provider how much activity is best for you.  Get regular screenings as often as recommended by your health care provider. Most people should have a colonoscopy at age 63. This information is not intended to replace advice given to you by your health care provider. Make sure you discuss any questions you have with your health care provider. Document Released: 02/27/2016 Document Revised: 02/27/2016 Document Reviewed: 02/27/2016 Elsevier Interactive Patient Education  [employer]2018 Elsevier Inc.

## 2017-10-03 NOTE — Assessment & Plan Note (Signed)
Continue simvastatin 40 mg daily.  Check lipid panel today.

## 2017-10-03 NOTE — Progress Notes (Signed)
   Subjective:  Thomas [name] is a 62 y.o. male who presents today with a chief complaint of T2DM follow up.   HPI:  T2DM, chronic problem, stable Currently on Farxiga 5mg  daily and glyburide-metformin 5-500mg  two tablets twice daily with meals.  Tolerates this well without side effects.  He has not made any changes to his activity level lately, has been eating less.  Wt Readings from Last 3 Encounters:  10/03/17 222 lb 6.4 oz (100.9 kg)  07/22/17 226 lb (102.5 kg)  02/05/17 225 lb (102.1 kg)   HTN, chronic problem, stable Currently on lisinopril 10 mg twice daily.  Tolerates well without side effects.  No reported chest pain or shortness of breath. On lisinopril 10mg  bid  HLD, chronic problem, stable Currently on simvastatin 40 mg daily.  Tolerates well without side effects.  No reported chest pain or shortness of breath.  Hypothryoid, chronic problem, stable Currently on levothyroxine 150 mcg daily.  Tolerates well without side effects.  ROS: Per HPI  PMH: He reports that he has never smoked. He has never used smokeless tobacco. He reports that he does not drink alcohol or use drugs.  Objective:  Physical Exam: BP 118/74 (BP Location: Left Arm, Patient Position: Sitting, Cuff Size: Normal)   Pulse 77   Temp 98.2 F (36.8 C) (Oral)   Ht 6' (1.829 m)   Wt 222 lb 6.4 oz (100.9 kg)   SpO2 94%   BMI 30.16 kg/m   Gen: NAD, resting comfortably CV: RRR with no murmurs appreciated Pulm: NWOB, CTAB with no crackles, wheezes, or rhonchi MSK: No edema, cyanosis, or clubbing noted Diabetic Foot Exam - Simple   Simple Foot Form Diabetic Foot exam was performed with the following findings:  Yes 10/03/2017 10:40 AM  Visual Inspection No deformities, no ulcerations, no other skin breakdown bilaterally:  Yes Sensation Testing Intact to touch and monofilament testing bilaterally:  Yes Pulse Check Posterior Tibialis and Dorsalis pulse intact bilaterally:  Yes Comments   Skin:  Warm, dry Neuro: Grossly normal, moves all extremities Psych: Normal affect and thought content  Results for orders placed or performed in visit on 10/03/17 (from the past 24 hour(s))  POCT glycosylated hemoglobin (Hb A1C)     Status: Abnormal   Collection Time: 10/03/17 10:43 AM  Result Value Ref Range   Hemoglobin A1C 6.6 (A) 4.0 - 5.6 %   HbA1c POC (<> result, manual entry)  4.0 - 5.6 %   HbA1c, POC (prediabetic range)  5.7 - 6.4 %   HbA1c, POC (controlled diabetic range)  0.0 - 7.0 %     Assessment/Plan:  DM2 (diabetes mellitus, type 2) (HCC) A1c stable at 6.6 today.  Continue Farxiga 5 mg daily and glyburide-metformin 5-500 mg tablet 2 tablets twice daily with meals.  Foot exam performed today without abnormality.  Advised patient to have eye exam done soon.  Hypothyroidism Stable.  Continue levothyroxine 150 mcg daily.  Check TSH today.  Hypertension associated with diabetes (HCC) At goal.  Continue lisinopril 10 mg twice daily.  Check CMET today.  Hyperlipidemia associated with type 2 diabetes mellitus (HCC) Continue simvastatin 40 mg daily.  Check lipid panel today.  Preventative healthcare Check HIV and hepatitis C antibody.  Discussed colon cancer screening, however patient declined.  Katina Degreealeb M. Jimmey RalphParker, MD 10/03/2017 10:48 AM

## 2017-10-03 NOTE — Assessment & Plan Note (Signed)
At goal.  Continue lisinopril 10 mg twice daily.  Check CMET today.

## 2017-10-04 LAB — HEPATITIS C ANTIBODY
HEP C AB: NONREACTIVE
SIGNAL TO CUT-OFF: 0.09 (ref <1.00)

## 2017-10-04 LAB — HIV ANTIBODY (ROUTINE TESTING W REFLEX): HIV: NONREACTIVE

## 2017-10-04 NOTE — Progress Notes (Signed)
Please inform patient of the following:  Blood counts are normal.  Thyroid test normal. HIV and hepatitis C tests are negative.  Electrolytes and liver function levels are normal. Kidney function is stable.  Cholesterol levels are stable.  Do not need to make any changes to his treatment plan at this time. Would like for him to come back to see me in 6 months for diabetes follow up.  Katina Degreealeb M. Jimmey RalphParker, MD 10/04/2017 3:14 PM

## 2017-10-14 DIAGNOSIS — M542 Cervicalgia: Secondary | ICD-10-CM | POA: Diagnosis not present

## 2017-10-14 DIAGNOSIS — M150 Primary generalized (osteo)arthritis: Secondary | ICD-10-CM | POA: Diagnosis not present

## 2017-10-14 DIAGNOSIS — E669 Obesity, unspecified: Secondary | ICD-10-CM | POA: Diagnosis not present

## 2017-10-14 DIAGNOSIS — M2550 Pain in unspecified joint: Secondary | ICD-10-CM | POA: Diagnosis not present

## 2017-10-14 DIAGNOSIS — N183 Chronic kidney disease, stage 3 (moderate): Secondary | ICD-10-CM | POA: Diagnosis not present

## 2017-10-14 DIAGNOSIS — Z6830 Body mass index (BMI) 30.0-30.9, adult: Secondary | ICD-10-CM | POA: Diagnosis not present

## 2017-10-21 ENCOUNTER — Ambulatory Visit: Payer: 59 | Admitting: Family Medicine

## 2017-10-26 MED FILL — FREESTYLE LANCETS: 90 days supply | Qty: 200 | Fill #1

## 2017-10-26 MED FILL — FREESTYLE LITE TEST STRIP: 90 days supply | Qty: 300 | Fill #1

## 2017-10-26 MED FILL — SIMVASTATIN 40 MG TABLET: 40 | 30 days supply | Qty: 30 | Fill #2

## 2017-10-27 MED FILL — HYDROCODON-APAP 10-325: 10-325 | 30 days supply | Qty: 120 | Fill #0

## 2017-10-27 MED FILL — OxyCONTIN 30 MG T12A: 30 | 30 days supply | Qty: 60 | Fill #0

## 2017-11-29 MED FILL — SIMVASTATIN 40 MG TABLET: 40 | 90 days supply | Qty: 90 | Fill #3

## 2017-11-29 MED FILL — FARXIGA 10 MG TABLET: 10 | 90 days supply | Qty: 90 | Fill #0

## 2017-12-01 MED FILL — OxyCONTIN 30 MG T12A: 30 | 30 days supply | Qty: 60 | Fill #0

## 2017-12-01 MED FILL — HYDROCODON-APAP 10-325: 10-325 | 30 days supply | Qty: 120 | Fill #0

## 2017-12-27 MED FILL — POTASSIUM CHLORIDE CRYS ER: 10 | 90 days supply | Qty: 180 | Fill #1

## 2017-12-27 MED FILL — LEVOTHYROXINE 150 MCG TAB: 150 | 90 days supply | Qty: 90 | Fill #0

## 2017-12-27 MED FILL — DICLOFENAC SODIUM 1% GEL: 1 | 30 days supply | Qty: 200 | Fill #2

## 2017-12-27 MED FILL — GLYBURIDE-METFORMIN 5-500 M: 5-500 | 90 days supply | Qty: 360 | Fill #1

## 2017-12-28 MED FILL — NORTRIPTYLINE HCL 50 MG CAP: 50 | 90 days supply | Qty: 270 | Fill #0

## 2017-12-28 MED FILL — LISINOPRIL-HCTZ 20-12.5 MG: 20-12.5 | 90 days supply | Qty: 180 | Fill #0

## 2017-12-29 MED FILL — OxyCONTIN 30 MG T12A: 30 | 30 days supply | Qty: 60 | Fill #0

## 2017-12-29 MED FILL — HYDROCODON-APAP 10-325: 10-325 | 30 days supply | Qty: 120 | Fill #0

## 2017-12-30 ENCOUNTER — Other Ambulatory Visit: Payer: Self-pay

## 2017-12-30 MED ORDER — NORTRIPTYLINE HCL 50 MG PO CAPS: 150.0000 mg | ORAL_CAPSULE | Freq: Every day | ORAL | 1 refills | Status: DC

## 2017-12-30 MED ORDER — LISINOPRIL 10 MG PO TABS: 10.0000 mg | ORAL_TABLET | Freq: Two times a day (BID) | ORAL | 1 refills | Status: DC

## 2017-12-30 MED FILL — LISINOPRIL 10 MG TABLET: 10 | 90 days supply | Qty: 180 | Fill #0

## 2018-01-02 ENCOUNTER — Telehealth: Payer: Self-pay | Admitting: Family Medicine

## 2018-01-02 NOTE — Telephone Encounter (Signed)
Copied from CRM [id number]. Topic: Quick Communication - Rx Refill/Question >> Jan 02, 2018  9:25 AM Darletta Moll L wrote: Medication: lisinopril (was taking lisinopril HCTZ 20/12.5mg  two tabs daily from other provider, please call pharmacy to clarify)  Has the patient contacted their pharmacy? Yes.   (Agent: If no, request that the patient contact the pharmacy for the refill.) (Agent: If yes, when and what did the pharmacy advise?)  Preferred Pharmacy (with phone number or street name): [facility] - [place], [place] - [address] [facility]. [address] [place] [address] Phone: [phone] Fax: [phone]  Agent: Please be advised that RX refills may take up to 3 business days. We ask that you follow-up with your pharmacy.

## 2018-01-04 ENCOUNTER — Other Ambulatory Visit: Payer: Self-pay

## 2018-01-04 MED ORDER — LISINOPRIL 10 MG PO TABS: 10.0000 mg | ORAL_TABLET | Freq: Two times a day (BID) | ORAL | 1 refills | Status: DC

## 2018-01-04 NOTE — Telephone Encounter (Signed)
Rx sent to pharmacy   

## 2018-01-11 DIAGNOSIS — M150 Primary generalized (osteo)arthritis: Secondary | ICD-10-CM | POA: Diagnosis not present

## 2018-01-11 DIAGNOSIS — M542 Cervicalgia: Secondary | ICD-10-CM | POA: Diagnosis not present

## 2018-01-11 DIAGNOSIS — Z6831 Body mass index (BMI) 31.0-31.9, adult: Secondary | ICD-10-CM | POA: Diagnosis not present

## 2018-01-11 DIAGNOSIS — M2550 Pain in unspecified joint: Secondary | ICD-10-CM | POA: Diagnosis not present

## 2018-01-11 DIAGNOSIS — N183 Chronic kidney disease, stage 3 (moderate): Secondary | ICD-10-CM | POA: Diagnosis not present

## 2018-01-11 DIAGNOSIS — E669 Obesity, unspecified: Secondary | ICD-10-CM | POA: Diagnosis not present

## 2018-01-25 MED FILL — FREESTYLE LITE TEST STRIP: 90 days supply | Qty: 300 | Fill #0

## 2018-01-25 MED FILL — FREESTYLE LANCETS: 90 days supply | Qty: 200 | Fill #2

## 2018-01-27 MED FILL — HYDROCODON-APAP 10-325: 10-325 | 30 days supply | Qty: 120 | Fill #0

## 2018-01-27 MED FILL — OxyCONTIN 30 MG T12A: 30 | 30 days supply | Qty: 60 | Fill #0

## 2018-02-18 DIAGNOSIS — H5203 Hypermetropia, bilateral: Secondary | ICD-10-CM | POA: Diagnosis not present

## 2018-02-27 MED FILL — FARXIGA 10 MG TABLET: 10 | 90 days supply | Qty: 90 | Fill #1

## 2018-02-27 MED FILL — SIMVASTATIN 40 MG TABLET: 40 | 90 days supply | Qty: 90 | Fill #4

## 2018-02-28 MED FILL — HYDROCODON-APAP 10-325: 10-325 | 30 days supply | Qty: 120 | Fill #0

## 2018-02-28 MED FILL — OxyCONTIN 30 MG T12A: 30 | 30 days supply | Qty: 60 | Fill #0

## 2018-03-28 MED FILL — NORTRIPTYLINE HCL 50 MG CAP: 50 | 90 days supply | Qty: 270 | Fill #0

## 2018-03-28 MED FILL — LEVOTHYROXINE 150 MCG TAB: 150 | 90 days supply | Qty: 90 | Fill #1

## 2018-03-28 MED FILL — GLYBURIDE-METFORMIN 5-500 M: 5-500 | 90 days supply | Qty: 360 | Fill #2

## 2018-03-28 MED FILL — DICLOFENAC SODIUM 1 % GEL: 1 | 30 days supply | Qty: 200 | Fill #3

## 2018-03-28 MED FILL — POTASSIUM CHLORIDE CRYS ER: 10 | 90 days supply | Qty: 180 | Fill #0

## 2018-03-30 MED FILL — OxyCONTIN 30 MG T12A: 30 | 30 days supply | Qty: 60 | Fill #0

## 2018-03-30 MED FILL — HYDROCODON-APAP 10-325: 10-325 | 30 days supply | Qty: 120 | Fill #0

## 2018-04-05 ENCOUNTER — Encounter: Payer: Self-pay | Admitting: Family Medicine

## 2018-04-05 ENCOUNTER — Ambulatory Visit: Payer: 59 | Admitting: Family Medicine

## 2018-04-05 VITALS — BP 110/68 | HR 73 | Temp 97.8°F | Ht 72.0 in | Wt 232.4 lb

## 2018-04-05 DIAGNOSIS — E039 Hypothyroidism, unspecified: Secondary | ICD-10-CM

## 2018-04-05 DIAGNOSIS — N183 Chronic kidney disease, stage 3 unspecified: Secondary | ICD-10-CM | POA: Insufficient documentation

## 2018-04-05 DIAGNOSIS — E1169 Type 2 diabetes mellitus with other specified complication: Secondary | ICD-10-CM

## 2018-04-05 DIAGNOSIS — E1159 Type 2 diabetes mellitus with other circulatory complications: Secondary | ICD-10-CM | POA: Diagnosis not present

## 2018-04-05 DIAGNOSIS — E1100 Type 2 diabetes mellitus with hyperosmolarity without nonketotic hyperglycemic-hyperosmolar coma (NKHHC): Secondary | ICD-10-CM

## 2018-04-05 DIAGNOSIS — I10 Essential (primary) hypertension: Secondary | ICD-10-CM

## 2018-04-05 DIAGNOSIS — E1122 Type 2 diabetes mellitus with diabetic chronic kidney disease: Secondary | ICD-10-CM | POA: Insufficient documentation

## 2018-04-05 DIAGNOSIS — E785 Hyperlipidemia, unspecified: Secondary | ICD-10-CM | POA: Diagnosis not present

## 2018-04-05 LAB — POCT GLYCOSYLATED HEMOGLOBIN (HGB A1C): Hemoglobin A1C: 7.4 % — AB (ref 4.0–5.6)

## 2018-04-05 NOTE — Patient Instructions (Signed)
It was very nice to see you today!  Your blood sugar is up just a bit.  Please make sure that you are avoiding sugary foods as much as possible.  Please come in 3 to 6 months to recheck your sugars.  We will not make any medication changes today.  Take care, Dr Jimmey Ralph

## 2018-04-05 NOTE — Assessment & Plan Note (Signed)
Last TSH 1.69.  Continue Synthroid 150 mcg daily.

## 2018-04-05 NOTE — Assessment & Plan Note (Signed)
A1c elevated to 7.4 in setting of recent dietary indiscretions.  Patient does not want to increase medications today.  He will work on cutting down sweets and carbs over the next several months.  We will continue Farxiga 5 mg daily and glyburide-metformin 5-500 2 tablets twice daily with meals.  He will follow-up with me in 3 to 6 months.  We will obtain records from most recent eye exam.

## 2018-04-05 NOTE — Assessment & Plan Note (Signed)
At goal today.  Continue lisinopril 10 mg twice daily.

## 2018-04-05 NOTE — Assessment & Plan Note (Signed)
Last LDL 77.  Continue simvastatin 40 mg daily.

## 2018-04-05 NOTE — Assessment & Plan Note (Signed)
GFR 45 on last BMET. Avoid nephrotoxic meds. Check BMET with next blood draw.

## 2018-04-05 NOTE — Progress Notes (Signed)
   Subjective:  Thomas [name] is a 63 y.o. male who presents today with a chief complaint of T2DM.   HPI:  His chronic medical conditions are outlined below:  # T2DM - Currently on Farxiga 5mg  daily and glyburide-metformin 5-500mg  two tablets twice daily with meals. - Has had a few dietary indiscretions over the last few months related to the holidays. -Sugars at home typically in the 70s to 120s.  # HTN  - On lisinopril 10mg  twice daily and tolerating well  # HLD - On simvastatin 40mg  daily and tolerating well  # Hypothyroidism - On synthroid daily and tolerating well.   # CKD 3  ROS: Per HPI  PMH: He reports that he has never smoked. He has never used smokeless tobacco. He reports that he does not drink alcohol or use drugs.  Objective:  Physical Exam: BP 110/68 (BP Location: Left Arm, Patient Position: Sitting, Cuff Size: Large)   Pulse 73   Temp 97.8 F (36.6 C) (Oral)   Ht 6' (1.829 m)   Wt 232 lb 6.4 oz (105.4 kg)   SpO2 96%   BMI 31.52 kg/m   Wt Readings from Last 3 Encounters:  04/05/18 232 lb 6.4 oz (105.4 kg)  10/03/17 222 lb 6.4 oz (100.9 kg)  07/22/17 226 lb (102.5 kg)  Gen: NAD, resting comfortably CV: RRR with 2/6 systolic murmur. Pulm: NWOB, CTAB with no crackles, wheezes, or rhonchi GI: Normal bowel sounds present. Soft, Nontender, Nondistended. MSK: No edema, cyanosis, or clubbing noted Skin: Warm, dry Neuro: Grossly normal, moves all extremities Psych: Normal affect and thought content  Results for orders placed or performed in visit on 04/05/18 (from the past 24 hour(s))  POCT HgB A1C     Status: Abnormal   Collection Time: 04/05/18  8:16 AM  Result Value Ref Range   Hemoglobin A1C 7.4 (A) 4.0 - 5.6 %     Assessment/Plan:  DM2 (diabetes mellitus, type 2) (HCC) A1c elevated to 7.4 in setting of recent dietary indiscretions.  Patient does not want to increase medications today.  He will work on cutting down sweets and carbs over  the next several months.  We will continue Farxiga 5 mg daily and glyburide-metformin 5-500 2 tablets twice daily with meals.  He will follow-up with me in 3 to 6 months.  We will obtain records from most recent eye exam.  Hypothyroidism Last TSH 1.69.  Continue Synthroid 150 mcg daily.  Hypertension associated with diabetes (HCC) At goal today.  Continue lisinopril 10 mg twice daily.  Hyperlipidemia associated with type 2 diabetes mellitus (HCC) Last LDL 77.  Continue simvastatin 40 mg daily.  CKD stage 3 secondary to diabetes (HCC) GFR 45 on last BMET. Avoid nephrotoxic meds. Check BMET with next blood draw.   Katina Degree. Jimmey Ralph, MD 04/05/2018 9:20 AM

## 2018-04-13 DIAGNOSIS — M542 Cervicalgia: Secondary | ICD-10-CM | POA: Diagnosis not present

## 2018-04-13 DIAGNOSIS — M150 Primary generalized (osteo)arthritis: Secondary | ICD-10-CM | POA: Diagnosis not present

## 2018-04-13 DIAGNOSIS — M2550 Pain in unspecified joint: Secondary | ICD-10-CM | POA: Diagnosis not present

## 2018-04-13 DIAGNOSIS — E669 Obesity, unspecified: Secondary | ICD-10-CM | POA: Diagnosis not present

## 2018-04-13 DIAGNOSIS — N183 Chronic kidney disease, stage 3 (moderate): Secondary | ICD-10-CM | POA: Diagnosis not present

## 2018-04-13 DIAGNOSIS — Z6831 Body mass index (BMI) 31.0-31.9, adult: Secondary | ICD-10-CM | POA: Diagnosis not present

## 2018-04-27 MED FILL — FREESTYLE LITE TEST STRIP: 90 days supply | Qty: 300 | Fill #1

## 2018-04-28 MED FILL — HYDROCODON-APAP 10-325: 10-325 | 30 days supply | Qty: 120 | Fill #0

## 2018-04-28 MED FILL — FREESTYLE LANCETS: 90 days supply | Qty: 200 | Fill #3

## 2018-04-28 MED FILL — OxyCONTIN 30 MG T12A: 30 | 30 days supply | Qty: 60 | Fill #0

## 2018-05-25 ENCOUNTER — Telehealth: Payer: Self-pay | Admitting: Family Medicine

## 2018-05-25 ENCOUNTER — Other Ambulatory Visit: Payer: Self-pay

## 2018-05-25 MED ORDER — DAPAGLIFLOZIN PROPANEDIOL 5 MG PO TABS: 5.0000 mg | ORAL_TABLET | Freq: Every day | ORAL | 1 refills | Status: DC

## 2018-05-25 NOTE — Telephone Encounter (Signed)
Copied from CRM [id number]. Topic: Quick Communication - Rx Refill/Question >> May 25, 2018  3:40 PM Stem, [place] D wrote: Medication: dapagliflozin propanediol (FARXIGA) 5 MG TABS tablet / Pt called and stated he has always take 10mg  of farxiga and his rx was for 5MG . He stated this may be an error. Please advise.  Has the patient contacted their pharmacy? Yes.   (Agent: If no, request that the patient contact the pharmacy for the refill.) (Agent: If yes, when and what did the pharmacy advise?)  Preferred Pharmacy (with phone number or street name): [facility] - [place], [place] - [address]. [phone] (Phone) [phone] (Fax)  Agent: Please be advised that RX refills may take up to 3 business days. We ask that you follow-up with your pharmacy.

## 2018-05-25 NOTE — Telephone Encounter (Signed)
dapagliflozin propanediol (FARXIGA) 5 MG TABS tablet. Pt states he has always taken 10mg  of farxiga and his RX today was for 5MG .  States he has been on 10 mg "For years." States original RX was not from Dr. Jimmey Ralph.   Please advise: [phone]

## 2018-05-26 MED ORDER — DAPAGLIFLOZIN PROPANEDIOL 10 MG PO TABS: 10.0000 mg | ORAL_TABLET | Freq: Every day | ORAL | 1 refills | Status: DC

## 2018-05-26 MED FILL — FARXIGA 10 MG TABLET: 10 | 90 days supply | Qty: 90 | Fill #0

## 2018-05-26 MED FILL — HYDROCODON-APAP 10-325: 10-325 | 30 days supply | Qty: 120 | Fill #0

## 2018-05-26 MED FILL — OxyCONTIN 30 MG T12A: 30 | 30 days supply | Qty: 60 | Fill #0

## 2018-05-26 NOTE — Telephone Encounter (Signed)
Per external med list, looks like pt had been getting Farxiga 10 mg. OK to send in Rx for Fargixa 10 mg?  Forwarding to Dr. Jimmey Ralph.

## 2018-05-26 NOTE — Telephone Encounter (Signed)
RX for Farxiga 10 mg sent to [facility] outpatient pharmacy. Pt aware.

## 2018-05-26 NOTE — Telephone Encounter (Signed)
Pt calling to check on RX for Farxiga stating he's been on 10mg . Pt is out of medication and worried about blood sugar. Please advise.   [facility] - [place], [place] - [address].     [phone] (Phone) [phone] (Fax)

## 2018-05-26 NOTE — Telephone Encounter (Signed)
Ok with me. Please place any necessary orders. 

## 2018-05-26 NOTE — Telephone Encounter (Signed)
See note

## 2018-05-29 MED FILL — SIMVASTATIN 40 MG TABLET: 40 | 90 days supply | Qty: 90 | Fill #5

## 2018-06-21 ENCOUNTER — Other Ambulatory Visit: Payer: Self-pay | Admitting: Family Medicine

## 2018-06-21 MED FILL — LISINOPRIL 10 MG TABLET: 10 | 90 days supply | Qty: 180 | Fill #0

## 2018-06-21 MED FILL — DICLOFENAC SODIUM 1 % GEL: 1 | 30 days supply | Qty: 200 | Fill #0

## 2018-06-21 MED FILL — NORTRIPTYLINE HCL 50 MG CAP: 50 | 90 days supply | Qty: 270 | Fill #0

## 2018-06-21 MED FILL — POTASSIUM CHL ER M10 TABLET: 10 | 90 days supply | Qty: 180 | Fill #0

## 2018-06-21 MED FILL — HYDROCODON-APAP 10-325: 10-325 | 30 days supply | Qty: 120 | Fill #0

## 2018-06-21 MED FILL — OxyCONTIN 30 MG T12A: 30 | 30 days supply | Qty: 60 | Fill #0

## 2018-06-21 MED FILL — GLYBURIDE-METFORMIN 5-500 M: 5-500 | 15 days supply | Qty: 60 | Fill #0

## 2018-06-21 MED FILL — LEVOTHYROXINE 150 MCG TAB: 150 | 90 days supply | Qty: 90 | Fill #0

## 2018-06-28 ENCOUNTER — Telehealth: Payer: Self-pay | Admitting: Family Medicine

## 2018-06-28 ENCOUNTER — Other Ambulatory Visit: Payer: Self-pay

## 2018-06-28 MED ORDER — GLYBURIDE-METFORMIN 5-500 MG PO TABS: 2.0000 | ORAL_TABLET | Freq: Two times a day (BID) | ORAL | 0 refills | Status: DC

## 2018-06-28 NOTE — Telephone Encounter (Signed)
Rx sent to pharmacy   

## 2018-06-28 NOTE — Telephone Encounter (Signed)
See note

## 2018-06-28 NOTE — Telephone Encounter (Signed)
Copied from CRM [id number]. Topic: General - Other >> Jun 28, 2018 10:28 AM Leafy Ro wrote: Reason for CRM: pt is calling and needs glyburide-metformin 5-500 #360 for 90 day supply . The med was originally prescribed by his old md. [facility]

## 2018-07-03 MED FILL — GLYBURIDE-METFORMIN 5-500 M: 5-500 | 90 days supply | Qty: 360 | Fill #0

## 2018-07-24 MED FILL — HYDROCODON-APAP 10-325: 10-325 | 30 days supply | Qty: 120 | Fill #0

## 2018-07-24 MED FILL — OxyCONTIN 30 MG T12A: 30 | 30 days supply | Qty: 60 | Fill #0

## 2018-07-24 MED FILL — FREESTYLE LANCETS: 90 days supply | Qty: 200 | Fill #0

## 2018-08-17 DIAGNOSIS — M2550 Pain in unspecified joint: Secondary | ICD-10-CM | POA: Diagnosis not present

## 2018-08-17 DIAGNOSIS — M542 Cervicalgia: Secondary | ICD-10-CM | POA: Diagnosis not present

## 2018-08-17 DIAGNOSIS — N183 Chronic kidney disease, stage 3 (moderate): Secondary | ICD-10-CM | POA: Diagnosis not present

## 2018-08-17 DIAGNOSIS — M150 Primary generalized (osteo)arthritis: Secondary | ICD-10-CM | POA: Diagnosis not present

## 2018-08-25 ENCOUNTER — Other Ambulatory Visit: Payer: Self-pay | Admitting: Family Medicine

## 2018-08-25 MED FILL — FARXIGA 10 MG TABLET: 10 | 90 days supply | Qty: 90 | Fill #1

## 2018-08-25 MED FILL — SIMVASTATIN 40 MG TABLET: 40 | 30 days supply | Qty: 30 | Fill #0

## 2018-08-28 MED FILL — OxyCONTIN 30 MG T12A: 30 | 30 days supply | Qty: 60 | Fill #0

## 2018-08-28 MED FILL — HYDROCODON-APAP 10-325: 10-325 | 30 days supply | Qty: 120 | Fill #0

## 2018-09-25 ENCOUNTER — Other Ambulatory Visit: Payer: Self-pay | Admitting: Family Medicine

## 2018-09-25 MED FILL — OxyCONTIN 30 MG T12A: 30 | 30 days supply | Qty: 60 | Fill #0

## 2018-09-25 MED FILL — GLYBURIDE-METFORMIN 5-500 M: 5-500 | 90 days supply | Qty: 360 | Fill #0

## 2018-09-25 MED FILL — NORTRIPTYLINE HCL 50 MG CAP: 50 | 90 days supply | Qty: 270 | Fill #0

## 2018-09-25 MED FILL — LISINOPRIL 10 MG TABS: 10 | 90 days supply | Qty: 180 | Fill #0

## 2018-09-25 MED FILL — LEVOTHYROXINE 150 MCG TAB: 150 | 90 days supply | Qty: 90 | Fill #0

## 2018-09-25 MED FILL — HYDROCODON-APAP 10-325: 10-325 | 30 days supply | Qty: 120 | Fill #0

## 2018-09-25 MED FILL — DICLOFENAC SODIUM 1 % GEL: 1 | 30 days supply | Qty: 200 | Fill #0

## 2018-09-25 MED FILL — SIMVASTATIN 40 MG TABLET: 40 | 90 days supply | Qty: 90 | Fill #1

## 2018-09-28 MED FILL — POTASSIUM CHLORIDE CRYS ER: 10 | 90 days supply | Qty: 180 | Fill #0

## 2018-10-04 ENCOUNTER — Encounter: Payer: Self-pay | Admitting: Family Medicine

## 2018-10-04 ENCOUNTER — Other Ambulatory Visit: Payer: Self-pay

## 2018-10-04 ENCOUNTER — Ambulatory Visit: Payer: 59 | Admitting: Family Medicine

## 2018-10-04 VITALS — BP 124/76 | HR 76 | Temp 98.1°F | Ht 72.0 in | Wt 233.0 lb

## 2018-10-04 DIAGNOSIS — E1100 Type 2 diabetes mellitus with hyperosmolarity without nonketotic hyperglycemic-hyperosmolar coma (NKHHC): Secondary | ICD-10-CM | POA: Diagnosis not present

## 2018-10-04 DIAGNOSIS — E039 Hypothyroidism, unspecified: Secondary | ICD-10-CM

## 2018-10-04 DIAGNOSIS — Z0001 Encounter for general adult medical examination with abnormal findings: Secondary | ICD-10-CM

## 2018-10-04 DIAGNOSIS — E1122 Type 2 diabetes mellitus with diabetic chronic kidney disease: Secondary | ICD-10-CM

## 2018-10-04 DIAGNOSIS — M549 Dorsalgia, unspecified: Secondary | ICD-10-CM

## 2018-10-04 DIAGNOSIS — E785 Hyperlipidemia, unspecified: Secondary | ICD-10-CM

## 2018-10-04 DIAGNOSIS — N183 Chronic kidney disease, stage 3 unspecified: Secondary | ICD-10-CM

## 2018-10-04 DIAGNOSIS — Z6831 Body mass index (BMI) 31.0-31.9, adult: Secondary | ICD-10-CM | POA: Diagnosis not present

## 2018-10-04 DIAGNOSIS — E1159 Type 2 diabetes mellitus with other circulatory complications: Secondary | ICD-10-CM | POA: Diagnosis not present

## 2018-10-04 DIAGNOSIS — I10 Essential (primary) hypertension: Secondary | ICD-10-CM

## 2018-10-04 DIAGNOSIS — I152 Hypertension secondary to endocrine disorders: Secondary | ICD-10-CM

## 2018-10-04 DIAGNOSIS — E1169 Type 2 diabetes mellitus with other specified complication: Secondary | ICD-10-CM | POA: Diagnosis not present

## 2018-10-04 DIAGNOSIS — G8929 Other chronic pain: Secondary | ICD-10-CM

## 2018-10-04 LAB — LIPID PANEL
Cholesterol: 134 mg/dL (ref 0–200)
HDL: 30.3 mg/dL — ABNORMAL LOW (ref >39.00)
LDL Cholesterol: 66 mg/dL (ref 0–99)
NonHDL: 103.77
Total CHOL/HDL Ratio: 4
Triglycerides: 191 mg/dL — ABNORMAL HIGH (ref 0.0–149.0)
VLDL: 38.2 mg/dL (ref 0.0–40.0)

## 2018-10-04 LAB — CBC
HCT: 45.3 % (ref 39.0–52.0)
Hemoglobin: 15.2 g/dL (ref 13.0–17.0)
MCHC: 33.6 g/dL (ref 30.0–36.0)
MCV: 91.5 fl (ref 78.0–100.0)
Platelets: 215 10*3/uL (ref 150.0–400.0)
RBC: 4.95 Mil/uL (ref 4.22–5.81)
RDW: 13.7 % (ref 11.5–15.5)
WBC: 8 10*3/uL (ref 4.0–10.5)

## 2018-10-04 LAB — COMPREHENSIVE METABOLIC PANEL
ALT: 30 U/L (ref 0–53)
AST: 26 U/L (ref 0–37)
Albumin: 4.5 g/dL (ref 3.5–5.2)
Alkaline Phosphatase: 46 U/L (ref 39–117)
BUN: 18 mg/dL (ref 6–23)
CO2: 29 mEq/L (ref 19–32)
Calcium: 9.3 mg/dL (ref 8.4–10.5)
Chloride: 102 mEq/L (ref 96–112)
Creatinine, Ser: 1.53 mg/dL — ABNORMAL HIGH (ref 0.40–1.50)
GFR: 46.01 mL/min — ABNORMAL LOW (ref >60.00)
Glucose, Bld: 100 mg/dL — ABNORMAL HIGH (ref 70–99)
Potassium: 5 mEq/L (ref 3.5–5.1)
Sodium: 139 mEq/L (ref 135–145)
Total Bilirubin: 0.4 mg/dL (ref 0.2–1.2)
Total Protein: 6.6 g/dL (ref 6.0–8.3)

## 2018-10-04 LAB — HEMOGLOBIN A1C: Hgb A1c MFr Bld: 8.4 % — ABNORMAL HIGH (ref 4.6–6.5)

## 2018-10-04 LAB — TSH: TSH: 2.85 u[IU]/mL (ref 0.35–4.50)

## 2018-10-04 NOTE — Assessment & Plan Note (Signed)
Check CMET. 

## 2018-10-04 NOTE — Assessment & Plan Note (Signed)
Stable. Continue management per rheum.

## 2018-10-04 NOTE — Assessment & Plan Note (Signed)
Continue levothyroxine 150 mcg daily.  Check TSH.

## 2018-10-04 NOTE — Assessment & Plan Note (Signed)
At goal  Continue lisinopril 10mg daily

## 2018-10-04 NOTE — Assessment & Plan Note (Signed)
Check A1c.  Continue Farxiga 10 mg daily and glyburide-metformin 5-500 two tablets twice daily with meals.  Check CBC, C met, and TSH.

## 2018-10-04 NOTE — Assessment & Plan Note (Signed)
Check lipid panel.  Continue simvastatin 40 mg daily. 

## 2018-10-04 NOTE — Patient Instructions (Addendum)
It was very nice to see you today!  Keep up the good work!  No changes today.  We will check blood work.   Eat at least 3 REAL meals and 1-2 snacks per day.  Aim for no more than 5 hours between eating.  Eat breakfast within one hour of getting up.    Obtain twice as many fruits/vegetables as protein or carbohydrate foods for both lunch and dinner.   Cut down on sweet beverages. This includes juice, soda, and sweet tea.    Exercise at least 150 minutes every week.   Come back in 6 months to recheck your blood sugars, or sooner as needed.   Take care, Dr Jerline Pain   Preventive Care 40-14 Years Old, Male Preventive care refers to lifestyle choices and visits with your health care provider that can promote health and wellness. This includes:  A yearly physical exam. This is also called an annual well check.  Regular dental and eye exams.  Immunizations.  Screening for certain conditions.  Healthy lifestyle choices, such as eating a healthy diet, getting regular exercise, not using drugs or products that contain nicotine and tobacco, and limiting alcohol use. What can I expect for my preventive care visit? Physical exam Your health care provider will check:  Height and weight. These may be used to calculate body mass index (BMI), which is a measurement that tells if you are at a healthy weight.  Heart rate and blood pressure.  Your skin for abnormal spots. Counseling Your health care provider may ask you questions about:  Alcohol, tobacco, and drug use.  Emotional well-being.  Home and relationship well-being.  Sexual activity.  Eating habits.  Work and work Statistician. What immunizations do I need?  Influenza (flu) vaccine  This is recommended every year. Tetanus, diphtheria, and pertussis (Tdap) vaccine  You may need a Td booster every 10 years. Varicella (chickenpox) vaccine  You may need this vaccine if you have not already been vaccinated. Zoster  (shingles) vaccine  You may need this after age 78. Measles, mumps, and rubella (MMR) vaccine  You may need at least one dose of MMR if you were born in 1957 or later. You may also need a second dose. Pneumococcal conjugate (PCV13) vaccine  You may need this if you have certain conditions and were not previously vaccinated. Pneumococcal polysaccharide (PPSV23) vaccine  You may need one or two doses if you smoke cigarettes or if you have certain conditions. Meningococcal conjugate (MenACWY) vaccine  You may need this if you have certain conditions. Hepatitis A vaccine  You may need this if you have certain conditions or if you travel or work in places where you may be exposed to hepatitis A. Hepatitis B vaccine  You may need this if you have certain conditions or if you travel or work in places where you may be exposed to hepatitis B. Haemophilus influenzae type b (Hib) vaccine  You may need this if you have certain risk factors. Human papillomavirus (HPV) vaccine  If recommended by your health care provider, you may need three doses over 6 months. You may receive vaccines as individual doses or as more than one vaccine together in one shot (combination vaccines). Talk with your health care provider about the risks and benefits of combination vaccines. What tests do I need? Blood tests  Lipid and cholesterol levels. These may be checked every 5 years, or more frequently if you are over 41 years old.  Hepatitis C test.  Hepatitis B test. Screening  Lung cancer screening. You may have this screening every year starting at age 70 if you have a 30-pack-year history of smoking and currently smoke or have quit within the past 15 years.  Prostate cancer screening. Recommendations will vary depending on your family history and other risks.  Colorectal cancer screening. All adults should have this screening starting at age 66 and continuing until age 7. Your health care provider may  recommend screening at age 11 if you are at increased risk. You will have tests every 1-10 years, depending on your results and the type of screening test.  Diabetes screening. This is done by checking your blood sugar (glucose) after you have not eaten for a while (fasting). You may have this done every 1-3 years.  Sexually transmitted disease (STD) testing. Follow these instructions at home: Eating and drinking  Eat a diet that includes fresh fruits and vegetables, whole grains, lean protein, and low-fat dairy products.  Take vitamin and mineral supplements as recommended by your health care provider.  Do not drink alcohol if your health care provider tells you not to drink.  If you drink alcohol: ? Limit how much you have to 0-2 drinks a day. ? Be aware of how much alcohol is in your drink. In the [place] one drink equals one 12 oz bottle of beer (355 mL), one 5 oz glass of wine (148 mL), or one 1 oz glass of hard liquor (44 mL). Lifestyle  Take daily care of your teeth and gums.  Stay active. Exercise for at least 30 minutes on 5 or more days each week.  Do not use any products that contain nicotine or tobacco, such as cigarettes, e-cigarettes, and chewing tobacco. If you need help quitting, ask your health care provider.  If you are sexually active, practice safe sex. Use a condom or other form of protection to prevent STIs (sexually transmitted infections).  Talk with your health care provider about taking a low-dose aspirin every day starting at age 58. What's next?  Go to your health care provider once a year for a well check visit.  Ask your health care provider how often you should have your eyes and teeth checked.  Stay up to date on all vaccines. This information is not intended to replace advice given to you by your health care provider. Make sure you discuss any questions you have with your health care provider. Document Released: 04/11/2015 Document Revised:  03/09/2018 Document Reviewed: 03/09/2018 Elsevier Patient Education  2020 [employer].

## 2018-10-04 NOTE — Progress Notes (Signed)
Please inform patient of the following:  Blood sugar is elevated but all of his other labs are STABLE. Recommend starting [place] 100mg  daily for his sugars in addition to his other meds. Would like for him to come back in 3 months to recheck A1c.  Algis Greenhouse. Jerline Pain, MD 10/04/2018 11:34 AM

## 2018-10-04 NOTE — Progress Notes (Signed)
Chief Complaint:  Thomas [name] is a 63 y.o. male who presents today for his annual comprehensive physical exam.    Assessment/Plan:  DM2 (diabetes mellitus, type 2) ([place]) Check A1c.  Continue Farxiga 10 mg daily and glyburide-metformin 5-500 two tablets twice daily with meals.  Check CBC, C met, and TSH.   CKD stage 3 secondary to diabetes (HCC) Check CMET.   Chronic back pain Stable. Continue management per rheum.   Hypothyroidism Continue levothyroxine 150 mcg daily.  Check TSH.  Hyperlipidemia associated with type 2 diabetes mellitus (HCC) Check lipid panel.  Continue simvastatin 40 mg daily.  Hypertension associated with diabetes ([place]) At goal.  Continue lisinopril 10 mg daily.  BMI 31 Discussed lifestyle modifications.   Preventative Healthcare: Declined colon cancer screening.   Patient Counseling(The following topics were reviewed and/or handout was given):  -Nutrition: Stressed importance of moderation in sodium/caffeine intake, saturated fat and cholesterol, caloric balance, sufficient intake of fresh fruits, vegetables, and fiber.  -Stressed the importance of regular exercise.   -Substance Abuse: Discussed cessation/primary prevention of tobacco, alcohol, or other drug use; driving or other dangerous activities under the influence; availability of treatment for abuse.   -Injury prevention: Discussed safety belts, safety helmets, smoke detector, smoking near bedding or upholstery.   -Sexuality: Discussed sexually transmitted diseases, partner selection, use of condoms, avoidance of unintended pregnancy and contraceptive alternatives.   -Dental health: Discussed importance of regular tooth brushing, flossing, and dental visits.  -Health maintenance and immunizations reviewed. Please refer to Health maintenance section.  Return to care in 1 year for next preventative visit.     Subjective:  HPI:  He has no acute complaints today.   His stable, chronic  medical conditions are outlined below:  # T2DM - Currently on Farxiga 24m daily and glyburide-metformin 5-5062mtwo tablets twice daily with meals. -Sugars at home typically in the 70s to 120s. - ROS: No polyuria or polydipsia  # HTN  - On lisinopril 1057mwice daily and tolerating well - ROS: No reported chest pain or shortness of breath  # HLD - On simvastatin 59m46mily and tolerating well - ROS: No reported myalgias  # Hypothyroidism - On synthroid 150mc49mily and tolerating well.   # CKD 3  # Chronic Back Pain - follows with pain clinic - on oxycontin 20mg 43mrs and pamelor 150mg q69mLifestyle Diet: Tries to eat a healthy, balanced, low carb diet.  Exercise: Limited due to chronic back pain.   Depression screen PHQ 2/9 10/04/2018  Decreased Interest 0  Down, Depressed, Hopeless 0  PHQ - 2 Score 0    Health Maintenance Due  Topic Date Due  . OPHTHALMOLOGY EXAM  06/20/1965  . FOOT EXAM  10/04/2018  . HEMOGLOBIN A1C  10/04/2018     ROS: Per HPI, otherwise a complete review of systems was negative.   PMH:  The following were reviewed and entered/updated in epic: Past Medical History:  Diagnosis Date  . Diabetes mellitus without complication (HCC)   [place]   . Hypertension    Patient Active Problem List   Diagnosis Date Noted  . CKD stage 3 secondary to diabetes (HCC) 01[place]/2020  . Heart murmur 07/22/2017  . Hypertension associated with diabetes (HCC) 04[place]/2019  . Hyperlipidemia associated with type 2 diabetes mellitus (HCC) 04[place]/2019  . Hypothyroidism 07/22/2017  . Chronic back pain 07/22/2017  . DM2 (diabetes mellitus, type 2) (HCC) 10[place]/2017   Past Surgical History:  Procedure Laterality Date  .  BACK SURGERY    . HERNIA REPAIR      Family History  Problem Relation Age of Onset  . Diabetes Mother   . Heart attack Mother   . Hypertension Mother   . Arthritis Mother   . Skin cancer Father   . Arthritis Father   . Breast  cancer Sister     Medications- reviewed and updated Current Outpatient Medications  Medication Sig Dispense Refill  . dapagliflozin propanediol (FARXIGA) 10 MG TABS tablet Take 10 mg by mouth daily. 90 tablet 1  . diclofenac sodium (VOLTAREN) 1 % GEL 2 (two) times daily. as directed  4  . glyBURIDE-metformin (GLUCOVANCE) 5-500 MG tablet TAKE 2 TABLETS BY MOUTH 2 TIMES DAILY WITH A MEAL. 360 tablet 0  . HYDROcodone-acetaminophen (NORCO) 10-325 MG tablet Take 1 tablet by mouth every 6 (six) hours as needed.    Marland Kitchen levothyroxine (SYNTHROID, LEVOTHROID) 150 MCG tablet Take 1 tablet (150 mcg total) by mouth daily before breakfast. 90 tablet 0  . lisinopril (PRINIVIL,ZESTRIL) 10 MG tablet TAKE 1 TABLET BY MOUTH 2 TIMES DAILY. 180 tablet 1  . nortriptyline (PAMELOR) 50 MG capsule TAKE 3 CAPSULES BY MOUTH AT BEDTIME. 270 capsule 0  . Omega-3 Fatty Acids (FISH OIL) 1000 MG CAPS Take 1 capsule by mouth 2 (two) times daily.    Marland Kitchen oxyCODONE (OXYCONTIN) 20 mg 12 hr tablet Take 20 mg by mouth every 12 (twelve) hours.    . potassium chloride (K-DUR) 10 MEQ tablet Take 1 tablet (10 mEq total) by mouth 2 (two) times daily. 180 tablet 1  . simvastatin (ZOCOR) 40 MG tablet TAKE 1 TABLET BY MOUTH DAILY 30 tablet 11   No current facility-administered medications for this visit.     Allergies-reviewed and updated Allergies  Allergen Reactions  . Trulicity [Dulaglutide] Hives  . Other     Stomach medicine, not sure of name. Had a mini stroke  . Penicillins Swelling    Arm swelling Has patient had a PCN reaction causing immediate rash, facial/tongue/throat swelling, SOB or lightheadedness with hypotension: No Has patient had a PCN reaction causing severe rash involving mucus membranes or skin necrosis: No Has patient had a PCN reaction that required hospitalization No Has patient had a PCN reaction occurring within the last 10 years: No If all of the above answers are "NO", then may proceed with Cephalosporin  use.    Social History   Socioeconomic History  . Marital status: Married    Spouse name: Not on file  . Number of children: Not on file  . Years of education: Not on file  . Highest education level: Not on file  Occupational History  . Not on file  Social Needs  . Financial resource strain: Not on file  . Food insecurity    Worry: Not on file    Inability: Not on file  . Transportation needs    Medical: Not on file    Non-medical: Not on file  Tobacco Use  . Smoking status: Never Smoker  . Smokeless tobacco: Never Used  Substance and Sexual Activity  . Alcohol use: Never    Frequency: Never    Comment: occ beer   . Drug use: No  . Sexual activity: Not on file  Lifestyle  . Physical activity    Days per week: Not on file    Minutes per session: Not on file  . Stress: Not on file  Relationships  . Social Herbalist on  phone: Not on file    Gets together: Not on file    Attends religious service: Not on file    Active member of club or organization: Not on file    Attends meetings of clubs or organizations: Not on file    Relationship status: Not on file  Other Topics Concern  . Not on file  Social History Narrative  . Not on file        Objective:  Physical Exam: BP 124/76 (BP Location: Left Arm, Patient Position: Sitting, Cuff Size: Normal)   Pulse 76   Temp 98.1 F (36.7 C) (Oral)   Ht 6' (1.829 m)   Wt 233 lb (105.7 kg)   BMI 31.60 kg/m   Body mass index is 31.6 kg/m. Wt Readings from Last 3 Encounters:  10/04/18 233 lb (105.7 kg)  04/05/18 232 lb 6.4 oz (105.4 kg)  10/03/17 222 lb 6.4 oz (100.9 kg)   Gen: NAD, resting comfortably HEENT: TMs normal bilaterally. OP clear. No thyromegaly noted.  CV: RRR with no murmurs appreciated Pulm: NWOB, CTAB with no crackles, wheezes, or rhonchi GI: Normal bowel sounds present. Soft, Nontender, Nondistended. MSK: no edema, cyanosis, or clubbing noted Skin: warm, dry Neuro: CN2-12 grossly  intact. Strength 5/5 in upper and lower extremities. Reflexes symmetric and intact bilaterally.  Psych: Normal affect and thought content     Tamicka Shimon M. Jerline Pain, MD 10/04/2018 8:12 AM

## 2018-10-26 ENCOUNTER — Telehealth: Payer: Self-pay | Admitting: Family Medicine

## 2018-10-26 MED FILL — OxyCONTIN 30 MG T12A: 30 | 30 days supply | Qty: 60 | Fill #0

## 2018-10-26 MED FILL — HYDROCODON-APAP 10-325: 10-325 | 30 days supply | Qty: 120 | Fill #0

## 2018-10-26 NOTE — Telephone Encounter (Signed)
Medication: CORRECTION-Test strips and lancets for  Freestyle Ultra    Pharmacy:  [place], [place] - [address] [phone] (Phone) [phone] (Fax)

## 2018-10-26 NOTE — Telephone Encounter (Signed)
Medication: Test strips and lancets for one touch ultra.   Pharmacy:  [place], [place] - [address]. [phone] (Phone) [phone] (Fax)

## 2018-10-30 ENCOUNTER — Other Ambulatory Visit: Payer: Self-pay

## 2018-10-30 MED ORDER — FREESTYLE LANCETS MISC: 1 refills | Status: DC

## 2018-10-30 MED ORDER — FREESTYLE LITE TEST VI STRP: ORAL_STRIP | 12 refills | Status: DC

## 2018-10-30 MED FILL — FREESTYLE LANCETS: 90 days supply | Qty: 200 | Fill #0

## 2018-10-30 MED FILL — FREESTYLE LITE TEST STRIP: 30 days supply | Qty: 100 | Fill #0

## 2018-10-30 NOTE — Telephone Encounter (Signed)
Rx sent to pharmacy for [facility] Lite test strips.  Freestyle Ultra does not exist.

## 2018-11-29 MED FILL — HYDROCODON-APAP 10-325: 10-325 | 30 days supply | Qty: 120 | Fill #0

## 2018-11-29 MED FILL — DICLOFENAC SODIUM 1 % GEL: 1 | 30 days supply | Qty: 200 | Fill #1

## 2018-11-29 MED FILL — OxyCONTIN 30 MG T12A: 30 | 30 days supply | Qty: 60 | Fill #0

## 2018-11-29 MED FILL — FARXIGA 10 MG TABLET: 10 | 30 days supply | Qty: 30 | Fill #0

## 2018-11-30 DIAGNOSIS — M150 Primary generalized (osteo)arthritis: Secondary | ICD-10-CM | POA: Diagnosis not present

## 2018-11-30 DIAGNOSIS — M2550 Pain in unspecified joint: Secondary | ICD-10-CM | POA: Diagnosis not present

## 2018-11-30 DIAGNOSIS — Z6831 Body mass index (BMI) 31.0-31.9, adult: Secondary | ICD-10-CM | POA: Diagnosis not present

## 2018-11-30 DIAGNOSIS — M542 Cervicalgia: Secondary | ICD-10-CM | POA: Diagnosis not present

## 2018-11-30 DIAGNOSIS — N183 Chronic kidney disease, stage 3 (moderate): Secondary | ICD-10-CM | POA: Diagnosis not present

## 2018-11-30 DIAGNOSIS — E669 Obesity, unspecified: Secondary | ICD-10-CM | POA: Diagnosis not present

## 2018-12-28 ENCOUNTER — Other Ambulatory Visit: Payer: Self-pay | Admitting: Family Medicine

## 2018-12-28 MED FILL — FREESTYLE LITE TEST STRIP: 30 days supply | Qty: 100 | Fill #1

## 2018-12-28 MED FILL — HYDROCODON-APAP 10-325: 10-325 | 30 days supply | Qty: 120 | Fill #0

## 2018-12-28 MED FILL — SIMVASTATIN 40 MG TABLET: 40 | 90 days supply | Qty: 90 | Fill #2

## 2018-12-28 MED FILL — NORTRIPTYLINE HCL 50 MG CAP: 50 | 90 days supply | Qty: 270 | Fill #0

## 2018-12-28 MED FILL — LISINOPRIL 10 MG TABS: 10 | 90 days supply | Qty: 180 | Fill #0

## 2018-12-28 MED FILL — GLYBURIDE-METFORMIN 5-500 M: 5-500 | 90 days supply | Qty: 360 | Fill #0

## 2018-12-28 MED FILL — OxyCONTIN 30 MG T12A: 30 | 30 days supply | Qty: 60 | Fill #0

## 2018-12-28 MED FILL — FARXIGA 10 MG TABLET: 10 | 30 days supply | Qty: 30 | Fill #1

## 2018-12-29 ENCOUNTER — Other Ambulatory Visit: Payer: Self-pay | Admitting: Family Medicine

## 2018-12-29 MED ORDER — LEVOTHYROXINE SODIUM 150 MCG PO TABS: 150.0000 ug | ORAL_TABLET | Freq: Every day | ORAL | 0 refills | Status: DC

## 2018-12-29 MED FILL — LEVOTHYROXINE 150 MCG TAB: 150 | 90 days supply | Qty: 90 | Fill #0

## 2018-12-29 NOTE — Telephone Encounter (Signed)
Copied from [place] [id number]. Topic: Quick Communication - Rx Refill/Question >> Dec 29, 2018  9:05 AM Rainey Pines A wrote: Medication:levothyroxine (SYNTHROID, LEVOTHROID) 150 MCG tablet,potassium chloride (K-DUR) 10 MEQ tablet   Has the patient contacted their pharmacy? Yes (Agent: If no, request that the patient contact the pharmacy for the refill.) (Agent: If yes, when and what did the pharmacy advise?)Contact pcp  Preferred Pharmacy (with phone number or street name): [place], [place]. [phone] (Phone) [phone] (Fax)    Agent: Please be advised that RX refills may take up to 3 business days. We ask that you follow-up with your pharmacy.

## 2019-01-01 ENCOUNTER — Other Ambulatory Visit: Payer: Self-pay | Admitting: Family Medicine

## 2019-01-01 MED ORDER — POTASSIUM CHLORIDE ER 10 MEQ PO TBCR: 10.0000 meq | EXTENDED_RELEASE_TABLET | Freq: Two times a day (BID) | ORAL | 1 refills | Status: DC

## 2019-01-01 MED FILL — POTASSIUM CHLORIDE CRYS ER: 10 | 90 days supply | Qty: 180 | Fill #0

## 2019-01-01 NOTE — Telephone Encounter (Signed)
Copied from [place] [id number]. Topic: Quick Communication - Rx Refill/Question >> Jan 01, 2019  9:04 AM Leward Quan A wrote: Medication: potassium chloride (K-DUR) 10 MEQ tablet   Has the patient contacted their pharmacy? Yes.   (Agent: If no, request that the patient contact the pharmacy for the refill.) (Agent: If yes, when and what did the pharmacy advise?)  Preferred Pharmacy (with phone number or street name): [place], [place] - [address]. [phone] (Phone) [phone] (Fax)    Agent: Please be advised that RX refills may take up to 3 business days. We ask that you follow-up with your pharmacy.

## 2019-01-26 MED FILL — FREESTYLE LITE TEST STRIP: 30 days supply | Qty: 100 | Fill #2

## 2019-01-26 MED FILL — FREESTYLE LANCETS: 90 days supply | Qty: 200 | Fill #1

## 2019-01-26 MED FILL — HYDROCODON-APAP 10-325: 10-325 | 30 days supply | Qty: 120 | Fill #0

## 2019-01-26 MED FILL — OxyCONTIN 30 MG T12A: 30 | 30 days supply | Qty: 60 | Fill #0

## 2019-01-26 MED FILL — FARXIGA 10 MG TABLET: 10 | 30 days supply | Qty: 30 | Fill #2

## 2019-02-17 ENCOUNTER — Encounter: Payer: Self-pay | Admitting: Family Medicine

## 2019-02-17 DIAGNOSIS — H5213 Myopia, bilateral: Secondary | ICD-10-CM | POA: Diagnosis not present

## 2019-02-17 LAB — HM DIABETES EYE EXAM

## 2019-02-26 MED FILL — OxyCONTIN 30 MG T12A: 30 | 30 days supply | Qty: 60 | Fill #0

## 2019-02-26 MED FILL — HYDROCODON-APAP 10-325: 10-325 | 30 days supply | Qty: 120 | Fill #0

## 2019-02-27 ENCOUNTER — Other Ambulatory Visit: Payer: Self-pay | Admitting: Family Medicine

## 2019-02-27 MED ORDER — DAPAGLIFLOZIN PROPANEDIOL 10 MG PO TABS: 10.0000 mg | ORAL_TABLET | Freq: Every day | ORAL | 1 refills | Status: DC

## 2019-02-27 MED FILL — FARXIGA 10 MG TABLET: 10 | 90 days supply | Qty: 90 | Fill #0

## 2019-02-27 MED FILL — DICLOFENAC SODIUM 1 % GEL: 1 | 30 days supply | Qty: 200 | Fill #2

## 2019-02-27 NOTE — Telephone Encounter (Signed)
dapagliflozin propanediol (FARXIGA) 10 MG TABS tablet  Pt is out of this med and for some reason every month [facility] is stating they do not have this on file. Would Dr Thomas [name] pls resend again. This has happened for 6 months, calling the pt last dr. Lazaro [name] not see anything on this end??? pls resend again to  [place], [place] - [address] [phone] (Phone) [phone] (Fax)

## 2019-03-06 DIAGNOSIS — M542 Cervicalgia: Secondary | ICD-10-CM | POA: Diagnosis not present

## 2019-03-06 DIAGNOSIS — M150 Primary generalized (osteo)arthritis: Secondary | ICD-10-CM | POA: Diagnosis not present

## 2019-03-06 DIAGNOSIS — M2550 Pain in unspecified joint: Secondary | ICD-10-CM | POA: Diagnosis not present

## 2019-03-06 DIAGNOSIS — E669 Obesity, unspecified: Secondary | ICD-10-CM | POA: Diagnosis not present

## 2019-03-06 DIAGNOSIS — Z6831 Body mass index (BMI) 31.0-31.9, adult: Secondary | ICD-10-CM | POA: Diagnosis not present

## 2019-03-28 ENCOUNTER — Other Ambulatory Visit: Payer: Self-pay | Admitting: Family Medicine

## 2019-03-28 MED FILL — POTASSIUM CHLORIDE CRYS ER: 10 | 90 days supply | Qty: 180 | Fill #1

## 2019-03-28 MED FILL — LEVOTHYROXINE SODIUM 150 MC: 150 | 90 days supply | Qty: 90 | Fill #0

## 2019-03-28 MED FILL — GLYBURIDE-METFORMIN 5-500 M: 5-500 | 90 days supply | Qty: 360 | Fill #0

## 2019-03-28 MED FILL — LISINOPRIL 10 MG TABS: 10 | 90 days supply | Qty: 180 | Fill #0

## 2019-03-28 MED FILL — HYDROCODON-APAP 10-325: 10-325 | 30 days supply | Qty: 120 | Fill #0

## 2019-03-28 MED FILL — FREESTYLE LITE TEST STRIP: 30 days supply | Qty: 100 | Fill #3

## 2019-03-28 MED FILL — OxyCONTIN 30 MG T12A: 30 | 30 days supply | Qty: 60 | Fill #0

## 2019-03-28 MED FILL — NORTRIPTYLINE HCL 50 MG CAP: 50 | 90 days supply | Qty: 270 | Fill #0

## 2019-03-28 MED FILL — SIMVASTATIN 40 MG TABLET: 40 | 90 days supply | Qty: 90 | Fill #3

## 2019-04-05 ENCOUNTER — Other Ambulatory Visit: Payer: Self-pay

## 2019-04-06 ENCOUNTER — Ambulatory Visit (INDEPENDENT_AMBULATORY_CARE_PROVIDER_SITE_OTHER): Payer: 59 | Admitting: Family Medicine

## 2019-04-06 ENCOUNTER — Encounter: Payer: Self-pay | Admitting: Family Medicine

## 2019-04-06 VITALS — BP 122/66 | HR 63 | Temp 97.3°F | Ht 72.0 in | Wt 233.2 lb

## 2019-04-06 DIAGNOSIS — I10 Essential (primary) hypertension: Secondary | ICD-10-CM | POA: Diagnosis not present

## 2019-04-06 DIAGNOSIS — E1169 Type 2 diabetes mellitus with other specified complication: Secondary | ICD-10-CM

## 2019-04-06 DIAGNOSIS — E1159 Type 2 diabetes mellitus with other circulatory complications: Secondary | ICD-10-CM | POA: Diagnosis not present

## 2019-04-06 DIAGNOSIS — E785 Hyperlipidemia, unspecified: Secondary | ICD-10-CM | POA: Diagnosis not present

## 2019-04-06 DIAGNOSIS — E039 Hypothyroidism, unspecified: Secondary | ICD-10-CM | POA: Diagnosis not present

## 2019-04-06 DIAGNOSIS — E1100 Type 2 diabetes mellitus with hyperosmolarity without nonketotic hyperglycemic-hyperosmolar coma (NKHHC): Secondary | ICD-10-CM

## 2019-04-06 LAB — COMPREHENSIVE METABOLIC PANEL
ALT: 43 U/L (ref 0–53)
AST: 35 U/L (ref 0–37)
Albumin: 4.4 g/dL (ref 3.5–5.2)
Alkaline Phosphatase: 49 U/L (ref 39–117)
BUN: 16 mg/dL (ref 6–23)
CO2: 27 mEq/L (ref 19–32)
Calcium: 9.2 mg/dL (ref 8.4–10.5)
Chloride: 102 mEq/L (ref 96–112)
Creatinine, Ser: 1.42 mg/dL (ref 0.40–1.50)
GFR: 50.07 mL/min — ABNORMAL LOW (ref >60.00)
Glucose, Bld: 112 mg/dL — ABNORMAL HIGH (ref 70–99)
Potassium: 4.4 mEq/L (ref 3.5–5.1)
Sodium: 139 mEq/L (ref 135–145)
Total Bilirubin: 0.5 mg/dL (ref 0.2–1.2)
Total Protein: 6.6 g/dL (ref 6.0–8.3)

## 2019-04-06 LAB — LIPID PANEL
Cholesterol: 143 mg/dL (ref 0–200)
HDL: 30.2 mg/dL — ABNORMAL LOW (ref >39.00)
LDL Cholesterol: 74 mg/dL (ref 0–99)
NonHDL: 113.12
Total CHOL/HDL Ratio: 5
Triglycerides: 194 mg/dL — ABNORMAL HIGH (ref 0.0–149.0)
VLDL: 38.8 mg/dL (ref 0.0–40.0)

## 2019-04-06 LAB — CBC
HCT: 45.9 % (ref 39.0–52.0)
Hemoglobin: 15.5 g/dL (ref 13.0–17.0)
MCHC: 33.8 g/dL (ref 30.0–36.0)
MCV: 90.9 fl (ref 78.0–100.0)
Platelets: 206 10*3/uL (ref 150.0–400.0)
RBC: 5.05 Mil/uL (ref 4.22–5.81)
RDW: 13.5 % (ref 11.5–15.5)
WBC: 8.6 10*3/uL (ref 4.0–10.5)

## 2019-04-06 LAB — POCT GLYCOSYLATED HEMOGLOBIN (HGB A1C): Hemoglobin A1C: 8.2 % — AB (ref 4.0–5.6)

## 2019-04-06 LAB — TSH: TSH: 4.46 u[IU]/mL (ref 0.35–4.50)

## 2019-04-06 NOTE — Assessment & Plan Note (Signed)
Stable. Check TSH.  Continue Synthroid 150 mcg daily.

## 2019-04-06 NOTE — Progress Notes (Signed)
   Thomas [name] is a 64 y.o. male who presents today for an office visit.  Assessment/Plan:  Chronic Problems Addressed Today: DM2 (diabetes mellitus, type 2) (HCC) A1c improved but still above goal at 8.2.  Home sugars in the 70s to 250s.  Has had recent dietary indiscretions with holidays.  We will continue current treatment plan with Farxiga 10 mg daily and glyburide-Metformin 5-500 mg 2 tablets twice daily.  Follow-up in 3 to 6 months to recheck A1c.  Consider addition of GLP-1 agonist or DPP 4 inhibitor if not at goal.  Hypothyroidism Stable. Check TSH.  Continue Synthroid 150 mcg daily.  Hyperlipidemia associated with type 2 diabetes mellitus (HCC) Tolerating simvastatin 40 mg daily well.  Will check CBC, C met, lipid panel today.  Hypertension associated with diabetes ([place]) Blood pressure at goal.  Continue lisinopril 10 mg daily.    Subjective:  HPI:  See A/P.       Objective:  Physical Exam: BP 122/66   Pulse 63   Temp (!) 97.3 F (36.3 C)   Ht 6' (1.829 m)   Wt 233 lb 4 oz (105.8 kg)   SpO2 99%   BMI 31.63 kg/m   Wt Readings from Last 3 Encounters:  04/06/19 233 lb 4 oz (105.8 kg)  10/04/18 233 lb (105.7 kg)  04/05/18 232 lb 6.4 oz (105.4 kg)  Gen: No acute distress, resting comfortably CV: Regular rate and rhythm with no murmurs appreciated Pulm: Normal work of breathing, clear to auscultation bilaterally with no crackles, wheezes, or rhonchi Neuro: Grossly normal, moves all extremities Psych: Normal affect and thought content      Renisha Cockrum M. Jerline Pain, MD 04/06/2019 8:32 AM

## 2019-04-06 NOTE — Assessment & Plan Note (Signed)
A1c improved but still above goal at 8.2.  Home sugars in the 70s to 250s.  Has had recent dietary indiscretions with holidays.  We will continue current treatment plan with Farxiga 10 mg daily and glyburide-Metformin 5-500 mg 2 tablets twice daily.  Follow-up in 3 to 6 months to recheck A1c.  Consider addition of GLP-1 agonist or DPP 4 inhibitor if not at goal.

## 2019-04-06 NOTE — Assessment & Plan Note (Signed)
Tolerating simvastatin 40 mg daily well.  Will check CBC, C met, lipid panel today.

## 2019-04-06 NOTE — Patient Instructions (Signed)
It was very nice to see you today!  Your blood sugar is better today but still slightly above goal.  Please continue working on your diet and exercise.  No medication changes today.  We will check blood work.  Come back in 6 months for your annual checkup, or sooner if needed.  Take care, Dr Jimmey Ralph  Please try these tips to maintain a healthy lifestyle:   Eat at least 3 REAL meals and 1-2 snacks per day.  Aim for no more than 5 hours between eating.  If you eat breakfast, please do so within one hour of getting up.    Each meal should contain half fruits/vegetables, one quarter protein, and one quarter carbs (no bigger than a computer mouse)   Cut down on sweet beverages. This includes juice, soda, and sweet tea.     Drink at least 1 glass of water with each meal and aim for at least 8 glasses per day   Exercise at least 150 minutes every week.

## 2019-04-06 NOTE — Assessment & Plan Note (Signed)
Blood pressure at goal.  Continue lisinopril 10 mg daily.

## 2019-04-09 NOTE — Progress Notes (Signed)
Please inform patient of the following:  Blood work is all stable. Would like for him to continue with his current treatment plan and we can recheck in 6 months.

## 2019-04-27 ENCOUNTER — Encounter: Payer: Self-pay | Admitting: Family Medicine

## 2019-04-30 MED FILL — FREESTYLE LANCETS: 50 days supply | Qty: 100 | Fill #2

## 2019-04-30 MED FILL — FREESTYLE LITE TEST STRIP: 30 days supply | Qty: 100 | Fill #4

## 2019-05-01 MED FILL — OxyCONTIN 30 MG T12A: 30 | 30 days supply | Qty: 60 | Fill #0

## 2019-05-01 MED FILL — HYDROCODON-APAP 10-325: 10-325 | 30 days supply | Qty: 120 | Fill #0

## 2019-05-28 MED FILL — DICLOFENAC SODIUM 1 % GEL: 1 | 30 days supply | Qty: 200 | Fill #3

## 2019-05-28 MED FILL — FARXIGA 10 MG TABLET: 10 | 90 days supply | Qty: 90 | Fill #1

## 2019-05-31 MED FILL — OxyCONTIN 30 MG T12A: 30 | 30 days supply | Qty: 60 | Fill #0

## 2019-05-31 MED FILL — HYDROCODON-APAP 10-325: 10-325 | 30 days supply | Qty: 120 | Fill #0

## 2019-06-07 DIAGNOSIS — M150 Primary generalized (osteo)arthritis: Secondary | ICD-10-CM | POA: Diagnosis not present

## 2019-06-07 DIAGNOSIS — E669 Obesity, unspecified: Secondary | ICD-10-CM | POA: Diagnosis not present

## 2019-06-07 DIAGNOSIS — M542 Cervicalgia: Secondary | ICD-10-CM | POA: Diagnosis not present

## 2019-06-07 DIAGNOSIS — M2550 Pain in unspecified joint: Secondary | ICD-10-CM | POA: Diagnosis not present

## 2019-06-07 DIAGNOSIS — Z6832 Body mass index (BMI) 32.0-32.9, adult: Secondary | ICD-10-CM | POA: Diagnosis not present

## 2019-06-27 ENCOUNTER — Other Ambulatory Visit: Payer: Self-pay | Admitting: Family Medicine

## 2019-06-27 MED FILL — NORTRIPTYLINE HCL 50 MG CAP: 50 | 90 days supply | Qty: 270 | Fill #0

## 2019-06-27 MED FILL — GLYBURIDE-METFORMIN 5-500 M: 5-500 | 90 days supply | Qty: 360 | Fill #0

## 2019-06-27 MED FILL — SIMVASTATIN 40 MG TABLET: 40 | 60 days supply | Qty: 60 | Fill #4

## 2019-06-27 MED FILL — LEVOTHYROXINE SODIUM 150 MC: 150 | 90 days supply | Qty: 90 | Fill #0

## 2019-06-27 MED FILL — LISINOPRIL 10 MG TABS: 10 | 90 days supply | Qty: 180 | Fill #1

## 2019-06-27 MED FILL — POTASSIUM CHLORIDE CRYS ER: 10 | 90 days supply | Qty: 180 | Fill #0

## 2019-06-28 MED FILL — OxyCONTIN 30 MG T12A: 30 | 30 days supply | Qty: 60 | Fill #0

## 2019-06-28 MED FILL — HYDROCODON-APAP 10-325: 10-325 | 30 days supply | Qty: 120 | Fill #0

## 2019-07-26 ENCOUNTER — Other Ambulatory Visit: Payer: Self-pay | Admitting: Family Medicine

## 2019-07-26 MED FILL — FREESTYLE LITE TEST STRIP: 30 days supply | Qty: 100 | Fill #5

## 2019-07-26 MED FILL — FREESTYLE LANCETS: 50 days supply | Qty: 100 | Fill #3

## 2019-07-26 MED FILL — DICLOFENAC SODIUM 1 % GEL: 1 | 15 days supply | Qty: 200 | Fill #0

## 2019-07-27 MED FILL — OxyCONTIN 30 MG T12A: 30 | 30 days supply | Qty: 60 | Fill #0

## 2019-07-27 MED FILL — HYDROCODON-APAP 10-325: 10-325 | 30 days supply | Qty: 120 | Fill #0

## 2019-08-24 ENCOUNTER — Other Ambulatory Visit: Payer: Self-pay | Admitting: Family Medicine

## 2019-08-24 MED FILL — FARXIGA 10 MG TABLET: 10 | 90 days supply | Qty: 90 | Fill #0

## 2019-08-24 MED FILL — FREESTYLE LITE TEST STRIP: 30 days supply | Qty: 100 | Fill #6

## 2019-08-24 MED FILL — SIMVASTATIN 40 MG TABLET: 40 | 60 days supply | Qty: 60 | Fill #0

## 2019-08-28 MED FILL — OxyCONTIN 30 MG T12A: 30 | 30 days supply | Qty: 60 | Fill #0

## 2019-08-28 MED FILL — HYDROCODON-APAP 10-325: 10-325 | 30 days supply | Qty: 120 | Fill #0

## 2019-09-07 DIAGNOSIS — Z6831 Body mass index (BMI) 31.0-31.9, adult: Secondary | ICD-10-CM | POA: Diagnosis not present

## 2019-09-07 DIAGNOSIS — N183 Chronic kidney disease, stage 3 unspecified: Secondary | ICD-10-CM | POA: Diagnosis not present

## 2019-09-07 DIAGNOSIS — M542 Cervicalgia: Secondary | ICD-10-CM | POA: Diagnosis not present

## 2019-09-07 DIAGNOSIS — E669 Obesity, unspecified: Secondary | ICD-10-CM | POA: Diagnosis not present

## 2019-09-07 DIAGNOSIS — M150 Primary generalized (osteo)arthritis: Secondary | ICD-10-CM | POA: Diagnosis not present

## 2019-09-07 DIAGNOSIS — M2550 Pain in unspecified joint: Secondary | ICD-10-CM | POA: Diagnosis not present

## 2019-09-26 ENCOUNTER — Other Ambulatory Visit: Payer: Self-pay | Admitting: Family Medicine

## 2019-09-26 MED FILL — LISINOPRIL 10 MG TABS: 10 | 90 days supply | Qty: 180 | Fill #0

## 2019-09-26 MED FILL — GLYBURIDE-METFORMIN 5-500 M: 5-500 | 90 days supply | Qty: 360 | Fill #0

## 2019-09-26 MED FILL — FREESTYLE LANCETS: 90 days supply | Qty: 200 | Fill #0

## 2019-09-26 MED FILL — POTASSIUM CHLORIDE CRYS ER: 10 | 90 days supply | Qty: 180 | Fill #1

## 2019-09-26 MED FILL — DICLOFENAC SODIUM 1 % GEL: 1 | 15 days supply | Qty: 200 | Fill #1

## 2019-09-26 MED FILL — NORTRIPTYLINE HCL 50 MG CAP: 50 | 90 days supply | Qty: 270 | Fill #0

## 2019-09-26 MED FILL — LEVOTHYROXINE SODIUM 150 MC: 150 | 90 days supply | Qty: 90 | Fill #0

## 2019-09-27 MED FILL — OxyCONTIN 30 MG T12A: 30 | 30 days supply | Qty: 60 | Fill #0

## 2019-09-27 MED FILL — HYDROCODON-APAP 10-325: 10-325 | 30 days supply | Qty: 120 | Fill #0

## 2019-10-09 ENCOUNTER — Other Ambulatory Visit: Payer: Self-pay

## 2019-10-09 ENCOUNTER — Encounter: Payer: Self-pay | Admitting: Family Medicine

## 2019-10-09 ENCOUNTER — Ambulatory Visit (INDEPENDENT_AMBULATORY_CARE_PROVIDER_SITE_OTHER): Payer: 59 | Admitting: Family Medicine

## 2019-10-09 VITALS — BP 125/78 | HR 74 | Temp 97.6°F | Ht 72.0 in | Wt 228.2 lb

## 2019-10-09 DIAGNOSIS — Z0001 Encounter for general adult medical examination with abnormal findings: Secondary | ICD-10-CM

## 2019-10-09 DIAGNOSIS — N183 Chronic kidney disease, stage 3 unspecified: Secondary | ICD-10-CM

## 2019-10-09 DIAGNOSIS — Z125 Encounter for screening for malignant neoplasm of prostate: Secondary | ICD-10-CM

## 2019-10-09 DIAGNOSIS — E785 Hyperlipidemia, unspecified: Secondary | ICD-10-CM

## 2019-10-09 DIAGNOSIS — Z23 Encounter for immunization: Secondary | ICD-10-CM

## 2019-10-09 DIAGNOSIS — E1169 Type 2 diabetes mellitus with other specified complication: Secondary | ICD-10-CM | POA: Diagnosis not present

## 2019-10-09 DIAGNOSIS — E1100 Type 2 diabetes mellitus with hyperosmolarity without nonketotic hyperglycemic-hyperosmolar coma (NKHHC): Secondary | ICD-10-CM | POA: Diagnosis not present

## 2019-10-09 DIAGNOSIS — E1159 Type 2 diabetes mellitus with other circulatory complications: Secondary | ICD-10-CM

## 2019-10-09 DIAGNOSIS — I10 Essential (primary) hypertension: Secondary | ICD-10-CM

## 2019-10-09 DIAGNOSIS — Z0000 Encounter for general adult medical examination without abnormal findings: Secondary | ICD-10-CM

## 2019-10-09 DIAGNOSIS — E1122 Type 2 diabetes mellitus with diabetic chronic kidney disease: Secondary | ICD-10-CM

## 2019-10-09 DIAGNOSIS — E039 Hypothyroidism, unspecified: Secondary | ICD-10-CM | POA: Diagnosis not present

## 2019-10-09 LAB — PSA: PSA: 0.21 ng/mL (ref 0.10–4.00)

## 2019-10-09 LAB — COMPREHENSIVE METABOLIC PANEL
ALT: 29 U/L (ref 0–53)
AST: 26 U/L (ref 0–37)
Albumin: 4.6 g/dL (ref 3.5–5.2)
Alkaline Phosphatase: 45 U/L (ref 39–117)
BUN: 20 mg/dL (ref 6–23)
CO2: 27 mEq/L (ref 19–32)
Calcium: 9.6 mg/dL (ref 8.4–10.5)
Chloride: 101 mEq/L (ref 96–112)
Creatinine, Ser: 1.53 mg/dL — ABNORMAL HIGH (ref 0.40–1.50)
GFR: 45.86 mL/min — ABNORMAL LOW (ref >60.00)
Glucose, Bld: 124 mg/dL — ABNORMAL HIGH (ref 70–99)
Potassium: 4.6 mEq/L (ref 3.5–5.1)
Sodium: 138 mEq/L (ref 135–145)
Total Bilirubin: 0.7 mg/dL (ref 0.2–1.2)
Total Protein: 6.9 g/dL (ref 6.0–8.3)

## 2019-10-09 LAB — CBC
HCT: 45.1 % (ref 39.0–52.0)
Hemoglobin: 15.2 g/dL (ref 13.0–17.0)
MCHC: 33.8 g/dL (ref 30.0–36.0)
MCV: 92.4 fl (ref 78.0–100.0)
Platelets: 209 10*3/uL (ref 150.0–400.0)
RBC: 4.88 Mil/uL (ref 4.22–5.81)
RDW: 13.8 % (ref 11.5–15.5)
WBC: 7.5 10*3/uL (ref 4.0–10.5)

## 2019-10-09 LAB — LIPID PANEL
Cholesterol: 140 mg/dL (ref 0–200)
HDL: 28.7 mg/dL — ABNORMAL LOW (ref >39.00)
NonHDL: 111.51
Total CHOL/HDL Ratio: 5
Triglycerides: 267 mg/dL — ABNORMAL HIGH (ref 0.0–149.0)
VLDL: 53.4 mg/dL — ABNORMAL HIGH (ref 0.0–40.0)

## 2019-10-09 LAB — HEMOGLOBIN A1C: Hgb A1c MFr Bld: 8.4 % — ABNORMAL HIGH (ref 4.6–6.5)

## 2019-10-09 LAB — LDL CHOLESTEROL, DIRECT: Direct LDL: 80 mg/dL

## 2019-10-09 LAB — TSH: TSH: 2.37 u[IU]/mL (ref 0.35–4.50)

## 2019-10-09 NOTE — Progress Notes (Signed)
Chief Complaint:  Thomas [name] is a 64 y.o. male who presents today for his annual comprehensive physical exam.    Assessment/Plan:  Chronic Problems Addressed Today: DM2 (diabetes mellitus, type 2) (HCC) Home sugar 70s to 130s.  Continue Farxiga 10 mg daily and glyburide-Metformin 5-500 mg twice daily.  Check A1c today.  CKD stage 3 secondary to diabetes (HCC) Check CMET.  Hypothyroidism Check TSH.  Continue Synthroid 150 mcg daily.  Hyperlipidemia associated with type 2 diabetes mellitus (HCC) Check CBC, CMET, lipid panel.  Continue simvastatin 40 mg daily.  Hypertension associated with diabetes (HCC) At goal.  Continue lisinopril 10 mg daily.   Body mass index is 30.95 kg/m. / Obese  BMI Metric Follow Up - 10/09/19 0845      BMI Metric Follow Up-Please document annually   BMI Metric Follow Up Education provided           Preventative Healthcare: Pneumonia vaccine given today.  Check CBC, CMET, TSH, lipid panel.  Check PSA.  Patient declined colon cancer screening.  Patient Counseling(The following topics were reviewed and/or handout was given):  -Nutrition: Stressed importance of moderation in sodium/caffeine intake, saturated fat and cholesterol, caloric balance, sufficient intake of fresh fruits, vegetables, and fiber.  -Stressed the importance of regular exercise.   -Substance Abuse: Discussed cessation/primary prevention of tobacco, alcohol, or other drug use; driving or other dangerous activities under the influence; availability of treatment for abuse.   -Injury prevention: Discussed safety belts, safety helmets, smoke detector, smoking near bedding or upholstery.   -Sexuality: Discussed sexually transmitted diseases, partner selection, use of condoms, avoidance of unintended pregnancy and contraceptive alternatives.   -Dental health: Discussed importance of regular tooth brushing, flossing, and dental visits.  -Health maintenance and immunizations reviewed.  Please refer to Health maintenance section.  Return to care in 1 year for next preventative visit.     Subjective:  HPI:  He has no acute complaints today.   Lifestyle Diet: Eating less.  Exercise: Limited due to back pain.   Depression screen PHQ 2/9 10/09/2019  Decreased Interest 0  Down, Depressed, Hopeless 0  PHQ - 2 Score 0    Health Maintenance Due  Topic Date Due  . COLONOSCOPY  Never done  . TETANUS/TDAP  03/29/2013  . PNA vac Low Risk Adult (1 of 2 - PCV13) 06/21/2019  . FOOT EXAM  10/04/2019  . HEMOGLOBIN A1C  10/04/2019     ROS: Per HPI, otherwise a complete review of systems was negative.   PMH:  The following were reviewed and entered/updated in epic: Past Medical History:  Diagnosis Date  . Diabetes mellitus without complication (HCC)   . Hypercholesterolemia   . Hypertension    Patient Active Problem List   Diagnosis Date Noted  . CKD stage 3 secondary to diabetes (HCC) 04/05/2018  . Heart murmur 07/22/2017  . Hypertension associated with diabetes (HCC) 07/22/2017  . Hyperlipidemia associated with type 2 diabetes mellitus (HCC) 07/22/2017  . Hypothyroidism 07/22/2017  . Chronic back pain 07/22/2017  . DM2 (diabetes mellitus, type 2) (HCC) 01/14/2016   Past Surgical History:  Procedure Laterality Date  . BACK SURGERY    . HERNIA REPAIR      Family History  Problem Relation Age of Onset  . Diabetes Mother   . Heart attack Mother   . Hypertension Mother   . Arthritis Mother   . Skin cancer Father   . Arthritis Father   . Breast cancer Sister  Medications- reviewed and updated Current Outpatient Medications  Medication Sig Dispense Refill  . diclofenac sodium (VOLTAREN) 1 % GEL 2 (two) times daily. as directed  4  . FARXIGA 10 MG TABS tablet TAKE 1 TABLET BY MOUTH DAILY. 90 tablet 1  . glucose blood (FREESTYLE LITE) test strip Check blood sugar 2-3 times daily 100 each 12  . glyBURIDE-metformin (GLUCOVANCE) 5-500 MG tablet TAKE 2  TABLETS BY MOUTH 2 TIMES DAILY WITH A MEAL. 360 tablet 1  . HYDROcodone-acetaminophen (NORCO) 10-325 MG tablet Take 1 tablet by mouth every 6 (six) hours as needed.    . Lancets (FREESTYLE) lancets Test blood glucose twice daily 200 each 1  . levothyroxine (SYNTHROID) 150 MCG tablet TAKE 1 TABLET (150 MCG TOTAL) BY MOUTH DAILY BEFORE BREAKFAST. 90 tablet 1  . lisinopril (ZESTRIL) 10 MG tablet TAKE 1 TABLET BY MOUTH TWICE A DAY AS DIRECTED 180 tablet 1  . nortriptyline (PAMELOR) 50 MG capsule TAKE 3 CAPSULES BY MOUTH AT BEDTIME. 270 capsule 1  . Omega-3 Fatty Acids (FISH OIL) 1000 MG CAPS Take 1 capsule by mouth 2 (two) times daily.    Marland Kitchen oxyCODONE (OXYCONTIN) 20 mg 12 hr tablet Take 20 mg by mouth every 12 (twelve) hours.    . potassium chloride (KLOR-CON) 10 MEQ tablet TAKE 1 TABLET (10 MEQ TOTAL) BY MOUTH 2 TIMES DAILY. 180 tablet 1  . simvastatin (ZOCOR) 40 MG tablet TAKE 1 TABLET BY MOUTH DAILY 60 tablet 6   No current facility-administered medications for this visit.    Allergies-reviewed and updated Allergies  Allergen Reactions  . Trulicity [Dulaglutide] Hives  . Other     Stomach medicine, not sure of name. Had a mini stroke  . Penicillins Swelling    Arm swelling Has patient had a PCN reaction causing immediate rash, facial/tongue/throat swelling, SOB or lightheadedness with hypotension: No Has patient had a PCN reaction causing severe rash involving mucus membranes or skin necrosis: No Has patient had a PCN reaction that required hospitalization No Has patient had a PCN reaction occurring within the last 10 years: No If all of the above answers are "NO", then may proceed with Cephalosporin use.    Social History   Socioeconomic History  . Marital status: Married    Spouse name: Not on file  . Number of children: Not on file  . Years of education: Not on file  . Highest education level: Not on file  Occupational History  . Not on file  Tobacco Use  . Smoking status:  Never Smoker  . Smokeless tobacco: Never Used  Vaping Use  . Vaping Use: Never used  Substance and Sexual Activity  . Alcohol use: Never    Comment: occ beer   . Drug use: No  . Sexual activity: Not on file  Other Topics Concern  . Not on file  Social History Narrative  . Not on file   Social Determinants of Health   Financial Resource Strain:   . Difficulty of Paying Living Expenses:   Food Insecurity:   . Worried About Programme researcher, broadcasting/film/video in the Last Year:   . Barista in the Last Year:   Transportation Needs:   . Freight forwarder (Medical):   Marland Kitchen Lack of Transportation (Non-Medical):   Physical Activity:   . Days of Exercise per Week:   . Minutes of Exercise per Session:   Stress:   . Feeling of Stress :   Social Connections:   .  Frequency of Communication with Friends and Family:   . Frequency of Social Gatherings with Friends and Family:   . Attends Religious Services:   . Active Member of Clubs or Organizations:   . Attends Banker Meetings:   Marland Kitchen Marital Status:         Objective:  Physical Exam: BP 125/78   Pulse 74   Temp 97.6 F (36.4 C)   Ht 6' (1.829 m)   Wt 228 lb 3.2 oz (103.5 kg)   SpO2 96%   BMI 30.95 kg/m   Body mass index is 30.95 kg/m. Wt Readings from Last 3 Encounters:  10/09/19 228 lb 3.2 oz (103.5 kg)  04/06/19 233 lb 4 oz (105.8 kg)  10/04/18 233 lb (105.7 kg)  Gen: NAD, resting comfortably HEENT: TMs normal bilaterally. OP clear. No thyromegaly noted.  CV: RRR with 2/6 systolic murmur Pulm: NWOB, CTAB with no crackles, wheezes, or rhonchi GI: Normal bowel sounds present. Soft, Nontender, Nondistended. MSK: no edema, cyanosis, or clubbing noted Skin: warm, dry Neuro: CN2-12 grossly intact. Strength 5/5 in upper and lower extremities. Reflexes symmetric and intact bilaterally.  Psych: Normal affect and thought content     Thomas [name] M. Jimmey Ralph, MD 10/09/2019 8:46 AM

## 2019-10-09 NOTE — Assessment & Plan Note (Signed)
Check CMET. 

## 2019-10-09 NOTE — Assessment & Plan Note (Signed)
Check CBC, CMET, lipid panel.  Continue simvastatin 40 mg daily.

## 2019-10-09 NOTE — Assessment & Plan Note (Signed)
At goal  Continue lisinopril 10mg daily

## 2019-10-09 NOTE — Assessment & Plan Note (Signed)
Check TSH.  Continue Synthroid 150 mcg daily. 

## 2019-10-09 NOTE — Patient Instructions (Signed)
It was very nice to see you today!  We will check blood work today.   Keep up the good work.  I will see back in 6 months.  Come back to see me sooner if needed.  Take care, Dr Jerline Pain  Please try these tips to maintain a healthy lifestyle:   Eat at least 3 REAL meals and 1-2 snacks per day.  Aim for no more than 5 hours between eating.  If you eat breakfast, please do so within one hour of getting up.    Each meal should contain half fruits/vegetables, one quarter protein, and one quarter carbs (no bigger than a computer mouse)   Cut down on sweet beverages. This includes juice, soda, and sweet tea.     Drink at least 1 glass of water with each meal and aim for at least 8 glasses per day   Exercise at least 150 minutes every week.    Preventive Care 54 Years and Older, Male Preventive care refers to lifestyle choices and visits with your health care provider that can promote health and wellness. This includes:  A yearly physical exam. This is also called an annual well check.  Regular dental and eye exams.  Immunizations.  Screening for certain conditions.  Healthy lifestyle choices, such as diet and exercise. What can I expect for my preventive care visit? Physical exam Your health care provider will check:  Height and weight. These may be used to calculate body mass index (BMI), which is a measurement that tells if you are at a healthy weight.  Heart rate and blood pressure.  Your skin for abnormal spots. Counseling Your health care provider may ask you questions about:  Alcohol, tobacco, and drug use.  Emotional well-being.  Home and relationship well-being.  Sexual activity.  Eating habits.  History of falls.  Memory and ability to understand (cognition).  Work and work Statistician. What immunizations do I need?  Influenza (flu) vaccine  This is recommended every year. Tetanus, diphtheria, and pertussis (Tdap) vaccine  You may need a  Td booster every 10 years. Varicella (chickenpox) vaccine  You may need this vaccine if you have not already been vaccinated. Zoster (shingles) vaccine  You may need this after age 41. Pneumococcal conjugate (PCV13) vaccine  One dose is recommended after age 29. Pneumococcal polysaccharide (PPSV23) vaccine  One dose is recommended after age 46. Measles, mumps, and rubella (MMR) vaccine  You may need at least one dose of MMR if you were born in 1957 or later. You may also need a second dose. Meningococcal conjugate (MenACWY) vaccine  You may need this if you have certain conditions. Hepatitis A vaccine  You may need this if you have certain conditions or if you travel or work in places where you may be exposed to hepatitis A. Hepatitis B vaccine  You may need this if you have certain conditions or if you travel or work in places where you may be exposed to hepatitis B. Haemophilus influenzae type b (Hib) vaccine  You may need this if you have certain conditions. You may receive vaccines as individual doses or as more than one vaccine together in one shot (combination vaccines). Talk with your health care provider about the risks and benefits of combination vaccines. What tests do I need? Blood tests  Lipid and cholesterol levels. These may be checked every 5 years, or more frequently depending on your overall health.  Hepatitis C test.  Hepatitis B test. Screening  Lung  cancer screening. You may have this screening every year starting at age 39 if you have a 30-pack-year history of smoking and currently smoke or have quit within the past 15 years.  Colorectal cancer screening. All adults should have this screening starting at age 51 and continuing until age 50. Your health care provider may recommend screening at age 68 if you are at increased risk. You will have tests every 1-10 years, depending on your results and the type of screening test.  Prostate cancer screening.  Recommendations will vary depending on your family history and other risks.  Diabetes screening. This is done by checking your blood sugar (glucose) after you have not eaten for a while (fasting). You may have this done every 1-3 years.  Abdominal aortic aneurysm (AAA) screening. You may need this if you are a current or former smoker.  Sexually transmitted disease (STD) testing. Follow these instructions at home: Eating and drinking  Eat a diet that includes fresh fruits and vegetables, whole grains, lean protein, and low-fat dairy products. Limit your intake of foods with high amounts of sugar, saturated fats, and salt.  Take vitamin and mineral supplements as recommended by your health care provider.  Do not drink alcohol if your health care provider tells you not to drink.  If you drink alcohol: ? Limit how much you have to 0-2 drinks a day. ? Be aware of how much alcohol is in your drink. In the [place] one drink equals one 12 oz bottle of beer (355 mL), one 5 oz glass of wine (148 mL), or one 1 oz glass of hard liquor (44 mL). Lifestyle  Take daily care of your teeth and gums.  Stay active. Exercise for at least 30 minutes on 5 or more days each week.  Do not use any products that contain nicotine or tobacco, such as cigarettes, e-cigarettes, and chewing tobacco. If you need help quitting, ask your health care provider.  If you are sexually active, practice safe sex. Use a condom or other form of protection to prevent STIs (sexually transmitted infections).  Talk with your health care provider about taking a low-dose aspirin or statin. What's next?  Visit your health care provider once a year for a well check visit.  Ask your health care provider how often you should have your eyes and teeth checked.  Stay up to date on all vaccines. This information is not intended to replace advice given to you by your health care provider. Make sure you discuss any questions you have with  your health care provider. Document Revised: 03/09/2018 Document Reviewed: 03/09/2018 Elsevier Patient Education  2020 [employer].

## 2019-10-09 NOTE — Assessment & Plan Note (Signed)
Home sugar 70s to 130s.  Continue Farxiga 10 mg daily and glyburide-Metformin 5-500 mg twice daily.  Check A1c today.

## 2019-10-15 ENCOUNTER — Other Ambulatory Visit: Payer: Self-pay

## 2019-10-15 MED ORDER — OZEMPIC (0.25 OR 0.5 MG/DOSE) 2 MG/1.5ML ~~LOC~~ SOPN: 0.5000 mg | PEN_INJECTOR | SUBCUTANEOUS | 3 refills | Status: DC

## 2019-10-15 NOTE — Progress Notes (Signed)
Please inform patient of the following:  A1c elevated and slightly up compared to last time. All of his other labs are STABLE.  Recommend starting ozempic 0.25mg  weekly. He can come to the office to discuss further if he wishes.  Katina Degree. Jimmey Ralph, MD 10/15/2019 10:45 AM

## 2019-10-16 MED FILL — OZEMPIC 0.25 OR 0.5 MG/DOSE: 2 | 84 days supply | Qty: 5 | Fill #0

## 2019-10-25 MED FILL — FREESTYLE LITE TEST STRIP: 30 days supply | Qty: 100 | Fill #7

## 2019-10-25 MED FILL — SIMVASTATIN 40 MG TABLET: 40 | 60 days supply | Qty: 60 | Fill #1

## 2019-10-29 MED FILL — HYDROCODON-APAP 10-325: 10-325 | 30 days supply | Qty: 120 | Fill #0

## 2019-10-29 MED FILL — OxyCONTIN 30 MG T12A: 30 | 30 days supply | Qty: 60 | Fill #0

## 2019-11-27 MED FILL — FARXIGA 10 MG TABLET: 10 | 90 days supply | Qty: 90 | Fill #1

## 2019-11-27 MED FILL — OxyCONTIN 30 MG T12A: 30 | 30 days supply | Qty: 60 | Fill #0

## 2019-11-27 MED FILL — DICLOFENAC SODIUM 1 % GEL: 1 | 15 days supply | Qty: 200 | Fill #2

## 2019-11-27 MED FILL — HYDROCODON-APAP 10-325: 10-325 | 30 days supply | Qty: 120 | Fill #0

## 2019-12-10 MED FILL — LISINOPRIL 10 MG TABS: 10 | 90 days supply | Qty: 180 | Fill #1

## 2019-12-11 DIAGNOSIS — M542 Cervicalgia: Secondary | ICD-10-CM | POA: Diagnosis not present

## 2019-12-11 DIAGNOSIS — N183 Chronic kidney disease, stage 3 unspecified: Secondary | ICD-10-CM | POA: Diagnosis not present

## 2019-12-11 DIAGNOSIS — M150 Primary generalized (osteo)arthritis: Secondary | ICD-10-CM | POA: Diagnosis not present

## 2019-12-11 DIAGNOSIS — M2550 Pain in unspecified joint: Secondary | ICD-10-CM | POA: Diagnosis not present

## 2019-12-11 DIAGNOSIS — Z6829 Body mass index (BMI) 29.0-29.9, adult: Secondary | ICD-10-CM | POA: Diagnosis not present

## 2019-12-11 DIAGNOSIS — E663 Overweight: Secondary | ICD-10-CM | POA: Diagnosis not present

## 2019-12-26 ENCOUNTER — Other Ambulatory Visit: Payer: Self-pay | Admitting: Family Medicine

## 2019-12-26 MED FILL — POTASSIUM CHLORIDE CRYS ER: 10 | 90 days supply | Qty: 180 | Fill #0

## 2019-12-26 MED FILL — FREESTYLE LANCETS: 50 days supply | Qty: 100 | Fill #0

## 2019-12-26 MED FILL — SIMVASTATIN 40 MG TABLET: 40 | 60 days supply | Qty: 60 | Fill #2

## 2019-12-26 MED FILL — NORTRIPTYLINE HCL 50 MG CAP: 50 | 90 days supply | Qty: 270 | Fill #1

## 2019-12-26 MED FILL — OZEMPIC 0.25 OR 0.5 MG/DOSE: 2 | 28 days supply | Qty: 2 | Fill #1

## 2019-12-26 MED FILL — FREESTYLE LITE TEST STRIP: 33 days supply | Qty: 100 | Fill #0

## 2019-12-26 MED FILL — GLYBURIDE-METFORMIN 5-500 M: 5-500 | 90 days supply | Qty: 360 | Fill #1

## 2019-12-26 MED FILL — LEVOTHYROXINE SODIUM 150 MC: 150 | 90 days supply | Qty: 90 | Fill #1

## 2019-12-28 MED FILL — HYDROCODON-APAP 10-325: 10-325 | 30 days supply | Qty: 120 | Fill #0

## 2019-12-28 MED FILL — OxyCONTIN 30 MG T12A: 30 | 30 days supply | Qty: 60 | Fill #0

## 2020-01-28 ENCOUNTER — Other Ambulatory Visit: Payer: Self-pay | Admitting: Family Medicine

## 2020-01-28 ENCOUNTER — Other Ambulatory Visit (HOSPITAL_COMMUNITY): Payer: Self-pay | Admitting: Physician Assistant

## 2020-01-28 MED FILL — OZEMPIC 0.25 OR 0.5 MG/DOSE: 2 | 28 days supply | Qty: 2 | Fill #0

## 2020-01-28 MED FILL — OxyCONTIN 30 MG T12A: 30 | 30 days supply | Qty: 60 | Fill #0

## 2020-01-28 MED FILL — DICLOFENAC SODIUM 1 % GEL: 1 | 15 days supply | Qty: 200 | Fill #0

## 2020-01-29 MED FILL — HYDROCODON-APAP 10-325: 10-325 | 30 days supply | Qty: 120 | Fill #0

## 2020-02-26 ENCOUNTER — Other Ambulatory Visit: Payer: Self-pay | Admitting: Family Medicine

## 2020-02-26 MED FILL — FREESTYLE LANCETS: 50 days supply | Qty: 100 | Fill #1

## 2020-02-26 MED FILL — OZEMPIC 0.25 OR 0.5 MG/DOSE: 2 | 28 days supply | Qty: 2 | Fill #1

## 2020-02-26 MED FILL — FARXIGA 10 MG TABLET: 10 | 90 days supply | Qty: 90 | Fill #0

## 2020-02-26 MED FILL — FREESTYLE LITE TEST STRIP: 33 days supply | Qty: 100 | Fill #1

## 2020-02-26 MED FILL — OxyCONTIN 30 MG T12A: 30 | 30 days supply | Qty: 60 | Fill #0

## 2020-02-26 MED FILL — HYDROCODON-APAP 10-325: 10-325 | 30 days supply | Qty: 120 | Fill #0

## 2020-02-29 MED FILL — LISINOPRIL 10 MG TABS: 10 | 90 days supply | Qty: 180 | Fill #0

## 2020-03-26 ENCOUNTER — Other Ambulatory Visit: Payer: Self-pay | Admitting: Family Medicine

## 2020-03-26 MED FILL — POTASSIUM CHLORIDE CRYS ER: 10 | 90 days supply | Qty: 180 | Fill #1

## 2020-03-26 MED FILL — DICLOFENAC SODIUM 1 % GEL: 1 | 15 days supply | Qty: 200 | Fill #1

## 2020-03-26 MED FILL — OxyCONTIN 30 MG T12A: 30 | 30 days supply | Qty: 60 | Fill #0

## 2020-03-26 MED FILL — OZEMPIC 0.25 OR 0.5 MG/DOSE: 2 | 28 days supply | Qty: 2 | Fill #2

## 2020-03-26 MED FILL — NORTRIPTYLINE HCL 50 MG CAP: 50 | 90 days supply | Qty: 270 | Fill #0

## 2020-03-26 MED FILL — HYDROCODON-APAP 10-325: 10-325 | 30 days supply | Qty: 120 | Fill #0

## 2020-03-26 MED FILL — SIMVASTATIN 40 MG TABLET: 40 | 60 days supply | Qty: 60 | Fill #3

## 2020-03-26 MED FILL — LEVOTHYROXINE SODIUM 150 MC: 150 | 90 days supply | Qty: 90 | Fill #0

## 2020-03-26 MED FILL — GLYBURIDE-METFORMIN 5-500 M: 5-500 | 90 days supply | Qty: 360 | Fill #0

## 2020-03-26 MED FILL — FREESTYLE LITE TEST STRIP: 33 days supply | Qty: 100 | Fill #2

## 2020-04-10 ENCOUNTER — Other Ambulatory Visit: Payer: Self-pay

## 2020-04-10 ENCOUNTER — Ambulatory Visit: Payer: 59 | Admitting: Family Medicine

## 2020-04-10 ENCOUNTER — Encounter: Payer: Self-pay | Admitting: Family Medicine

## 2020-04-10 VITALS — BP 130/70 | HR 75 | Temp 97.9°F | Ht 72.0 in | Wt 228.8 lb

## 2020-04-10 DIAGNOSIS — E1100 Type 2 diabetes mellitus with hyperosmolarity without nonketotic hyperglycemic-hyperosmolar coma (NKHHC): Secondary | ICD-10-CM | POA: Diagnosis not present

## 2020-04-10 DIAGNOSIS — E785 Hyperlipidemia, unspecified: Secondary | ICD-10-CM | POA: Diagnosis not present

## 2020-04-10 DIAGNOSIS — I152 Hypertension secondary to endocrine disorders: Secondary | ICD-10-CM

## 2020-04-10 DIAGNOSIS — E1159 Type 2 diabetes mellitus with other circulatory complications: Secondary | ICD-10-CM | POA: Diagnosis not present

## 2020-04-10 DIAGNOSIS — H6123 Impacted cerumen, bilateral: Secondary | ICD-10-CM | POA: Diagnosis not present

## 2020-04-10 DIAGNOSIS — E1169 Type 2 diabetes mellitus with other specified complication: Secondary | ICD-10-CM

## 2020-04-10 DIAGNOSIS — E039 Hypothyroidism, unspecified: Secondary | ICD-10-CM | POA: Diagnosis not present

## 2020-04-10 LAB — POCT GLYCOSYLATED HEMOGLOBIN (HGB A1C): Hemoglobin A1C: 7.6 % — AB (ref 4.0–5.6)

## 2020-04-10 LAB — COMPREHENSIVE METABOLIC PANEL
ALT: 42 U/L (ref 0–53)
AST: 35 U/L (ref 0–37)
Albumin: 4.6 g/dL (ref 3.5–5.2)
Alkaline Phosphatase: 44 U/L (ref 39–117)
BUN: 18 mg/dL (ref 6–23)
CO2: 28 mEq/L (ref 19–32)
Calcium: 9.6 mg/dL (ref 8.4–10.5)
Chloride: 103 mEq/L (ref 96–112)
Creatinine, Ser: 1.41 mg/dL (ref 0.40–1.50)
GFR: 52.19 mL/min — ABNORMAL LOW (ref >60.00)
Glucose, Bld: 103 mg/dL — ABNORMAL HIGH (ref 70–99)
Potassium: 4.6 mEq/L (ref 3.5–5.1)
Sodium: 138 mEq/L (ref 135–145)
Total Bilirubin: 0.7 mg/dL (ref 0.2–1.2)
Total Protein: 7.2 g/dL (ref 6.0–8.3)

## 2020-04-10 LAB — LIPID PANEL
Cholesterol: 127 mg/dL (ref 0–200)
HDL: 36 mg/dL — ABNORMAL LOW (ref >39.00)
LDL Cholesterol: 62 mg/dL (ref 0–99)
NonHDL: 91.25
Total CHOL/HDL Ratio: 4
Triglycerides: 146 mg/dL (ref 0.0–149.0)
VLDL: 29.2 mg/dL (ref 0.0–40.0)

## 2020-04-10 LAB — CBC
HCT: 47.3 % (ref 39.0–52.0)
Hemoglobin: 15.9 g/dL (ref 13.0–17.0)
MCHC: 33.7 g/dL (ref 30.0–36.0)
MCV: 92 fl (ref 78.0–100.0)
Platelets: 215 10*3/uL (ref 150.0–400.0)
RBC: 5.13 Mil/uL (ref 4.22–5.81)
RDW: 13.4 % (ref 11.5–15.5)
WBC: 8.5 10*3/uL (ref 4.0–10.5)

## 2020-04-10 LAB — TSH: TSH: 2.18 u[IU]/mL (ref 0.35–4.50)

## 2020-04-10 NOTE — Assessment & Plan Note (Signed)
Check TSH.  Continue Synthroid 150 mcg daily. 

## 2020-04-10 NOTE — Assessment & Plan Note (Signed)
Check lipids.  Continue simvastatin 40 mg daily. 

## 2020-04-10 NOTE — Assessment & Plan Note (Signed)
At goal  Continue lisinopril 10mg daily

## 2020-04-10 NOTE — Progress Notes (Addendum)
   Thomas [name] is a 65 y.o. male who presents today for an office visit.  Assessment/Plan:  New/Acute Problems: Cerumen impaction Unable to be irrigated or extracted today by RMA or myself.  Will place referral to ENT.  Chronic Problems Addressed Today: DM2 (diabetes mellitus, type 2) (HCC) Did not tolerate Ozempic.  A1c improved to 7.6.  We will continue current regimen with Farxiga 10 mg daily and glyburide-metformin 12-998 mg twice daily.  Hypothyroidism Check TSH.  Continue Synthroid 150 mcg daily.  Hyperlipidemia associated with type 2 diabetes mellitus (HCC) Check lipids.  Continue simvastatin 40 mg daily.  Hypertension associated with diabetes (HCC) At goal.  Continue lisinopril 10 mg daily.    Subjective:  HPI:  See A/p.         Objective:  Physical Exam: BP 130/70   Pulse 75   Temp 97.9 F (36.6 C) (Temporal)   Ht 6' (1.829 m)   Wt 228 lb 12.8 oz (103.8 kg)   SpO2 95%   BMI 31.03 kg/m   Wt Readings from Last 3 Encounters:  04/10/20 228 lb 12.8 oz (103.8 kg)  10/09/19 228 lb 3.2 oz (103.5 kg)  04/06/19 233 lb 4 oz (105.8 kg)  Gen: No acute distress, resting comfortably HEENT: Bilateral cerumen impaction noted. CV: Regular rate and rhythm with 2/6 systolic murmur appreciated Pulm: Normal work of breathing, clear to auscultation bilaterally with no crackles, wheezes, or rhonchi Neuro: Grossly normal, moves all extremities Psych: Normal affect and thought content  Procedure note:  Verbal consent obtained.  Attempted to irrigate ears with hydrogen peroxide mixture unsuccessfully.  Infiltrated Debrox into right EAC and allowed to sit for 10 minutes Attempted to irrigate and extract cerumen again with instrumentation.  Again unsuccessful.      Katina Degree. Jimmey Ralph, MD 04/10/2020 8:31 AM

## 2020-04-10 NOTE — Patient Instructions (Signed)
It was very nice to see you today!  Your blood sugar looks good today.  We will continue current medications.  We will check blood work today.  No medication changes.  I will see back in 6 months for your annual checkup with blood work.  Please come back to see me sooner if needed  Take care, Dr Jimmey Ralph  Please try these tips to maintain a healthy lifestyle:   Eat at least 3 REAL meals and 1-2 snacks per day.  Aim for no more than 5 hours between eating.  If you eat breakfast, please do so within one hour of getting up.    Each meal should contain half fruits/vegetables, one quarter protein, and one quarter carbs (no bigger than a computer mouse)   Cut down on sweet beverages. This includes juice, soda, and sweet tea.     Drink at least 1 glass of water with each meal and aim for at least 8 glasses per day   Exercise at least 150 minutes every week.

## 2020-04-10 NOTE — Assessment & Plan Note (Signed)
Did not tolerate Ozempic.  A1c improved to 7.6.  We will continue current regimen with Farxiga 10 mg daily and glyburide-metformin 12-998 mg twice daily.

## 2020-04-10 NOTE — Addendum Note (Signed)
Addended by: Ardith Dark on: 04/10/2020 09:39 AM   Modules accepted: Orders

## 2020-04-11 NOTE — Progress Notes (Signed)
Please inform patient of the following:  Labs are all stable. Would like for him to keep up the good work and we can recheck in 6 months.  Katina Degree. Jimmey Ralph, MD 04/11/2020 10:08 AM

## 2020-04-15 DIAGNOSIS — M542 Cervicalgia: Secondary | ICD-10-CM | POA: Diagnosis not present

## 2020-04-15 DIAGNOSIS — E669 Obesity, unspecified: Secondary | ICD-10-CM | POA: Diagnosis not present

## 2020-04-15 DIAGNOSIS — Z6831 Body mass index (BMI) 31.0-31.9, adult: Secondary | ICD-10-CM | POA: Diagnosis not present

## 2020-04-15 DIAGNOSIS — M2550 Pain in unspecified joint: Secondary | ICD-10-CM | POA: Diagnosis not present

## 2020-04-15 DIAGNOSIS — M150 Primary generalized (osteo)arthritis: Secondary | ICD-10-CM | POA: Diagnosis not present

## 2020-04-15 DIAGNOSIS — N183 Chronic kidney disease, stage 3 unspecified: Secondary | ICD-10-CM | POA: Diagnosis not present

## 2020-04-29 MED FILL — FREESTYLE LANCETS: 50 days supply | Qty: 100 | Fill #2

## 2020-05-01 ENCOUNTER — Other Ambulatory Visit (HOSPITAL_COMMUNITY): Payer: Self-pay | Admitting: Internal Medicine

## 2020-05-01 MED FILL — HYDROCODON-APAP 10-325: 10-325 | 30 days supply | Qty: 120 | Fill #0

## 2020-05-01 MED FILL — OxyCONTIN 30 MG T12A: 30 | 30 days supply | Qty: 60 | Fill #0

## 2020-05-14 DIAGNOSIS — H900 Conductive hearing loss, bilateral: Secondary | ICD-10-CM | POA: Diagnosis not present

## 2020-05-14 DIAGNOSIS — H6123 Impacted cerumen, bilateral: Secondary | ICD-10-CM | POA: Diagnosis not present

## 2020-05-26 ENCOUNTER — Other Ambulatory Visit: Payer: Self-pay | Admitting: Family Medicine

## 2020-05-26 MED FILL — FARXIGA 10 MG TABLET: 10 | 90 days supply | Qty: 90 | Fill #1

## 2020-05-26 MED FILL — DICLOFENAC SODIUM 1 % GEL: 1 | 15 days supply | Qty: 200 | Fill #2

## 2020-05-26 MED FILL — FREESTYLE LITE TEST STRIP: 33 days supply | Qty: 100 | Fill #3

## 2020-05-29 MED FILL — OxyCONTIN 30 MG T12A: 30 | 30 days supply | Qty: 60 | Fill #0

## 2020-05-29 MED FILL — HYDROCODON-APAP 10-325: 10-325 | 30 days supply | Qty: 120 | Fill #0

## 2020-06-18 MED FILL — FREESTYLE LANCETS: 50 days supply | Qty: 100 | Fill #3

## 2020-06-25 ENCOUNTER — Other Ambulatory Visit (HOSPITAL_COMMUNITY): Payer: Self-pay | Admitting: Physician Assistant

## 2020-06-25 MED FILL — GLYBURIDE-METFORMIN 5-500 M: 5-500 | 90 days supply | Qty: 360 | Fill #1

## 2020-06-25 MED FILL — LISINOPRIL 10 MG TABS: 10 | 90 days supply | Qty: 180 | Fill #1

## 2020-06-25 MED FILL — LEVOTHYROXINE SODIUM 150 MC: 150 | 90 days supply | Qty: 90 | Fill #1

## 2020-06-25 MED FILL — POTASSIUM CHLORIDE CRYS ER: 10 | 90 days supply | Qty: 180 | Fill #0

## 2020-06-25 MED FILL — FREESTYLE LITE TEST STRIP: 33 days supply | Qty: 100 | Fill #4

## 2020-06-25 MED FILL — NORTRIPTYLINE HCL 50 MG CAP: 50 | 90 days supply | Qty: 270 | Fill #1

## 2020-06-25 MED FILL — SIMVASTATIN 40 MG TABLET: 40 | 60 days supply | Qty: 60 | Fill #4

## 2020-06-26 MED FILL — OxyCONTIN 30 MG T12A: 30 | 30 days supply | Qty: 60 | Fill #0

## 2020-06-26 MED FILL — HYDROCODON-APAP 10-325: 10-325 | 30 days supply | Qty: 120 | Fill #0

## 2020-07-11 ENCOUNTER — Other Ambulatory Visit (HOSPITAL_COMMUNITY): Payer: Self-pay

## 2020-07-15 DIAGNOSIS — M150 Primary generalized (osteo)arthritis: Secondary | ICD-10-CM | POA: Diagnosis not present

## 2020-07-15 DIAGNOSIS — N183 Chronic kidney disease, stage 3 unspecified: Secondary | ICD-10-CM | POA: Diagnosis not present

## 2020-07-15 DIAGNOSIS — M2550 Pain in unspecified joint: Secondary | ICD-10-CM | POA: Diagnosis not present

## 2020-07-15 DIAGNOSIS — M542 Cervicalgia: Secondary | ICD-10-CM | POA: Diagnosis not present

## 2020-07-15 DIAGNOSIS — E669 Obesity, unspecified: Secondary | ICD-10-CM | POA: Diagnosis not present

## 2020-07-15 DIAGNOSIS — Z6831 Body mass index (BMI) 31.0-31.9, adult: Secondary | ICD-10-CM | POA: Diagnosis not present

## 2020-07-16 DIAGNOSIS — H5213 Myopia, bilateral: Secondary | ICD-10-CM | POA: Diagnosis not present

## 2020-07-24 ENCOUNTER — Other Ambulatory Visit: Payer: Self-pay | Admitting: Family Medicine

## 2020-07-24 ENCOUNTER — Other Ambulatory Visit: Payer: Self-pay | Admitting: Physician Assistant

## 2020-07-24 ENCOUNTER — Other Ambulatory Visit (HOSPITAL_COMMUNITY): Payer: Self-pay

## 2020-07-24 MED FILL — Glucose Blood Test Strip: 33 days supply | Qty: 100 | Fill #0 | Status: AC

## 2020-07-25 ENCOUNTER — Other Ambulatory Visit (HOSPITAL_COMMUNITY): Payer: Self-pay

## 2020-07-25 ENCOUNTER — Other Ambulatory Visit: Payer: Self-pay | Admitting: Family Medicine

## 2020-07-25 MED ORDER — FREESTYLE LANCETS MISC
1 refills | Status: DC
  Filled 2020-07-25: qty 100, 50d supply, fill #0
  Filled 2020-08-26 – 2020-09-22 (×2): qty 100, 50d supply, fill #1

## 2020-07-25 NOTE — Telephone Encounter (Signed)
I think this was supposed to go to his pain doctor.  Katina Degree. Jimmey Ralph, MD 07/25/2020 3:29 PM

## 2020-07-25 NOTE — Telephone Encounter (Signed)
Rx request 

## 2020-07-28 ENCOUNTER — Other Ambulatory Visit (HOSPITAL_COMMUNITY): Payer: Self-pay

## 2020-07-28 ENCOUNTER — Other Ambulatory Visit: Payer: Self-pay | Admitting: Physician Assistant

## 2020-07-28 MED ORDER — HYDROCODONE-ACETAMINOPHEN 10-325 MG PO TABS
1.0000 | ORAL_TABLET | Freq: Four times a day (QID) | ORAL | 0 refills | Status: DC | PRN
  Filled 2020-07-28: qty 120, 30d supply, fill #0

## 2020-07-28 MED ORDER — DICLOFENAC SODIUM 1 % EX GEL
CUTANEOUS | 5 refills | Status: DC
  Filled 2020-07-28: qty 200, 30d supply, fill #0
  Filled 2020-09-22: qty 200, 30d supply, fill #1

## 2020-07-28 MED ORDER — OXYCONTIN 30 MG PO T12A
30.0000 mg | EXTENDED_RELEASE_TABLET | Freq: Two times a day (BID) | ORAL | 0 refills | Status: DC
  Filled 2020-07-28: qty 60, 30d supply, fill #0

## 2020-07-29 ENCOUNTER — Other Ambulatory Visit (HOSPITAL_COMMUNITY): Payer: Self-pay

## 2020-08-26 ENCOUNTER — Other Ambulatory Visit (HOSPITAL_COMMUNITY): Payer: Self-pay

## 2020-08-26 ENCOUNTER — Other Ambulatory Visit: Payer: Self-pay | Admitting: Family Medicine

## 2020-08-26 MED ORDER — HYDROCODONE-ACETAMINOPHEN 10-325 MG PO TABS
1.0000 | ORAL_TABLET | Freq: Four times a day (QID) | ORAL | 0 refills | Status: DC | PRN
  Filled 2020-08-26: qty 120, 30d supply, fill #0

## 2020-08-26 MED ORDER — OXYCONTIN 30 MG PO T12A
30.0000 mg | EXTENDED_RELEASE_TABLET | Freq: Two times a day (BID) | ORAL | 0 refills | Status: DC
  Filled 2020-08-26: qty 60, 30d supply, fill #0

## 2020-08-26 MED ORDER — DAPAGLIFLOZIN PROPANEDIOL 10 MG PO TABS
10.0000 mg | ORAL_TABLET | Freq: Every day | ORAL | 1 refills | Status: DC
  Filled 2020-08-26: qty 90, 90d supply, fill #0

## 2020-08-26 MED FILL — Glucose Blood Test Strip: 33 days supply | Qty: 100 | Fill #1 | Status: AC

## 2020-08-27 ENCOUNTER — Other Ambulatory Visit (HOSPITAL_COMMUNITY): Payer: Self-pay

## 2020-09-22 ENCOUNTER — Other Ambulatory Visit (HOSPITAL_COMMUNITY): Payer: Self-pay

## 2020-09-22 ENCOUNTER — Other Ambulatory Visit: Payer: Self-pay | Admitting: Family Medicine

## 2020-09-22 MED ORDER — HYDROCODONE-ACETAMINOPHEN 10-325 MG PO TABS
1.0000 | ORAL_TABLET | Freq: Four times a day (QID) | ORAL | 0 refills | Status: DC | PRN
  Filled 2020-09-25: qty 120, 30d supply, fill #0

## 2020-09-22 MED ORDER — GLYBURIDE-METFORMIN 5-500 MG PO TABS
2.0000 | ORAL_TABLET | Freq: Two times a day (BID) | ORAL | 1 refills | Status: DC
  Filled 2020-09-22: qty 360, 90d supply, fill #0

## 2020-09-22 MED ORDER — LISINOPRIL 10 MG PO TABS
10.0000 mg | ORAL_TABLET | Freq: Two times a day (BID) | ORAL | 1 refills | Status: DC
  Filled 2020-09-22: qty 180, 90d supply, fill #0

## 2020-09-22 MED ORDER — OXYCONTIN 30 MG PO T12A
30.0000 mg | EXTENDED_RELEASE_TABLET | Freq: Two times a day (BID) | ORAL | 0 refills | Status: DC
  Filled 2020-09-25: qty 60, 30d supply, fill #0

## 2020-09-22 MED ORDER — SIMVASTATIN 40 MG PO TABS
40.0000 mg | ORAL_TABLET | Freq: Every day | ORAL | 6 refills | Status: DC
  Filled 2020-09-22: qty 90, 90d supply, fill #0

## 2020-09-22 MED FILL — Glucose Blood Test Strip: 33 days supply | Qty: 100 | Fill #2 | Status: AC

## 2020-09-22 MED FILL — Potassium Chloride Microencapsulated Crys ER Tab 10 mEq: ORAL | 90 days supply | Qty: 180 | Fill #0 | Status: AC

## 2020-09-25 ENCOUNTER — Other Ambulatory Visit (HOSPITAL_COMMUNITY): Payer: Self-pay

## 2020-09-25 ENCOUNTER — Other Ambulatory Visit: Payer: Self-pay | Admitting: Family Medicine

## 2020-09-25 MED ORDER — LEVOTHYROXINE SODIUM 150 MCG PO TABS
150.0000 ug | ORAL_TABLET | Freq: Every day | ORAL | 1 refills | Status: DC
  Filled 2020-09-25: qty 90, 90d supply, fill #0

## 2020-09-25 MED ORDER — NORTRIPTYLINE HCL 50 MG PO CAPS
150.0000 mg | ORAL_CAPSULE | Freq: Every day | ORAL | 1 refills | Status: DC
  Filled 2020-09-25: qty 270, 90d supply, fill #0

## 2020-09-26 ENCOUNTER — Other Ambulatory Visit (HOSPITAL_COMMUNITY): Payer: Self-pay

## 2020-10-09 ENCOUNTER — Encounter: Payer: Self-pay | Admitting: Family Medicine

## 2020-10-09 ENCOUNTER — Other Ambulatory Visit: Payer: Self-pay | Admitting: *Deleted

## 2020-10-09 ENCOUNTER — Other Ambulatory Visit: Payer: Self-pay

## 2020-10-09 ENCOUNTER — Ambulatory Visit (INDEPENDENT_AMBULATORY_CARE_PROVIDER_SITE_OTHER): Payer: Medicare HMO | Admitting: Family Medicine

## 2020-10-09 ENCOUNTER — Other Ambulatory Visit (HOSPITAL_COMMUNITY): Payer: Self-pay

## 2020-10-09 VITALS — BP 140/70 | HR 77 | Temp 98.1°F | Ht 72.0 in | Wt 237.0 lb

## 2020-10-09 DIAGNOSIS — E039 Hypothyroidism, unspecified: Secondary | ICD-10-CM

## 2020-10-09 DIAGNOSIS — E1122 Type 2 diabetes mellitus with diabetic chronic kidney disease: Secondary | ICD-10-CM | POA: Diagnosis not present

## 2020-10-09 DIAGNOSIS — E1159 Type 2 diabetes mellitus with other circulatory complications: Secondary | ICD-10-CM

## 2020-10-09 DIAGNOSIS — Z6832 Body mass index (BMI) 32.0-32.9, adult: Secondary | ICD-10-CM

## 2020-10-09 DIAGNOSIS — M549 Dorsalgia, unspecified: Secondary | ICD-10-CM | POA: Diagnosis not present

## 2020-10-09 DIAGNOSIS — E669 Obesity, unspecified: Secondary | ICD-10-CM

## 2020-10-09 DIAGNOSIS — Z125 Encounter for screening for malignant neoplasm of prostate: Secondary | ICD-10-CM | POA: Diagnosis not present

## 2020-10-09 DIAGNOSIS — I152 Hypertension secondary to endocrine disorders: Secondary | ICD-10-CM

## 2020-10-09 DIAGNOSIS — N183 Chronic kidney disease, stage 3 unspecified: Secondary | ICD-10-CM

## 2020-10-09 DIAGNOSIS — E1169 Type 2 diabetes mellitus with other specified complication: Secondary | ICD-10-CM

## 2020-10-09 DIAGNOSIS — E1100 Type 2 diabetes mellitus with hyperosmolarity without nonketotic hyperglycemic-hyperosmolar coma (NKHHC): Secondary | ICD-10-CM

## 2020-10-09 DIAGNOSIS — E785 Hyperlipidemia, unspecified: Secondary | ICD-10-CM

## 2020-10-09 DIAGNOSIS — Z0001 Encounter for general adult medical examination with abnormal findings: Secondary | ICD-10-CM

## 2020-10-09 DIAGNOSIS — G8929 Other chronic pain: Secondary | ICD-10-CM

## 2020-10-09 DIAGNOSIS — R011 Cardiac murmur, unspecified: Secondary | ICD-10-CM

## 2020-10-09 LAB — HEMOGLOBIN A1C: Hgb A1c MFr Bld: 9.3 % — ABNORMAL HIGH (ref 4.6–6.5)

## 2020-10-09 LAB — COMPREHENSIVE METABOLIC PANEL
ALT: 29 U/L (ref 0–53)
AST: 24 U/L (ref 0–37)
Albumin: 4.3 g/dL (ref 3.5–5.2)
Alkaline Phosphatase: 48 U/L (ref 39–117)
BUN: 25 mg/dL — ABNORMAL HIGH (ref 6–23)
CO2: 28 mEq/L (ref 19–32)
Calcium: 9 mg/dL (ref 8.4–10.5)
Chloride: 101 mEq/L (ref 96–112)
Creatinine, Ser: 1.72 mg/dL — ABNORMAL HIGH (ref 0.40–1.50)
GFR: 40.97 mL/min — ABNORMAL LOW (ref >60.00)
Glucose, Bld: 157 mg/dL — ABNORMAL HIGH (ref 70–99)
Potassium: 4.9 mEq/L (ref 3.5–5.1)
Sodium: 137 mEq/L (ref 135–145)
Total Bilirubin: 0.6 mg/dL (ref 0.2–1.2)
Total Protein: 6.4 g/dL (ref 6.0–8.3)

## 2020-10-09 LAB — LIPID PANEL
Cholesterol: 142 mg/dL (ref 0–200)
HDL: 27.8 mg/dL — ABNORMAL LOW (ref >39.00)
NonHDL: 114.67
Total CHOL/HDL Ratio: 5
Triglycerides: 286 mg/dL — ABNORMAL HIGH (ref 0.0–149.0)
VLDL: 57.2 mg/dL — ABNORMAL HIGH (ref 0.0–40.0)

## 2020-10-09 LAB — PSA: PSA: 0.21 ng/mL (ref 0.10–4.00)

## 2020-10-09 LAB — TSH: TSH: 2.29 u[IU]/mL (ref 0.35–5.50)

## 2020-10-09 LAB — LDL CHOLESTEROL, DIRECT: Direct LDL: 76 mg/dL

## 2020-10-09 NOTE — Assessment & Plan Note (Signed)
Check c-Met. 

## 2020-10-09 NOTE — Assessment & Plan Note (Signed)
At goal on lisinopril 10 mg twice daily.

## 2020-10-09 NOTE — Assessment & Plan Note (Signed)
Check lipids.  Continue simvastatin 40 mg daily. 

## 2020-10-09 NOTE — Assessment & Plan Note (Signed)
Last A1c 7.6.  Continue Farxiga 10 mg daily and glyburide-metformin 04-998 twice daily.  Continue lifestyle modifications.  He has not tolerated GLP agonist in the past.  We will check A1c today.

## 2020-10-09 NOTE — Progress Notes (Signed)
Chief Complaint:  Thomas [name] is a 65 y.o. male who presents today for his annual comprehensive physical exam.    Assessment/Plan:  Chronic Problems Addressed Today: DM2 (diabetes mellitus, type 2) ([place]) Last A1c 7.6.  Continue Farxiga 10 mg daily and glyburide-metformin 04-998 twice daily.  Continue lifestyle modifications.  He has not tolerated GLP agonist in the past.  We will check A1c today.  CKD stage 3 secondary to diabetes (HCC) Check c-Met.  Chronic back pain Follows with rheumatology.  On chronic opioids.  Hypothyroidism Check TSH today.  He is on Synthroid 150 mcg daily.  Hyperlipidemia associated with type 2 diabetes mellitus (HCC) Check lipids.  Continue simvastatin 40 mg daily.  Hypertension associated with diabetes ([place]) At goal on lisinopril 10 mg twice daily.  Heart murmur Stable.  No red flag signs or symptoms.  Continue with watchful waiting.   Body mass index is 32.14 kg/m. / Obese  BMI Metric Follow Up - 10/09/20 0824       BMI Metric Follow Up-Please document annually   BMI Metric Follow Up Education provided              Preventative Healthcare: Discussed shingles vaccine and he  will check with pharmacy.  Check labs today.  Declined colon cancer screening.  Patient Counseling(The following topics were reviewed and/or handout was given):  -Nutrition: Stressed importance of moderation in sodium/caffeine intake, saturated fat and cholesterol, caloric balance, sufficient intake of fresh fruits, vegetables, and fiber.  -Stressed the importance of regular exercise.   -Substance Abuse: Discussed cessation/primary prevention of tobacco, alcohol, or other drug use; driving or other dangerous activities under the influence; availability of treatment for abuse.   -Injury prevention: Discussed safety belts, safety helmets, smoke detector, smoking near bedding or upholstery.   -Sexuality: Discussed sexually transmitted diseases, partner selection,  use of condoms, avoidance of unintended pregnancy and contraceptive alternatives.   -Dental health: Discussed importance of regular tooth brushing, flossing, and dental visits.  -Health maintenance and immunizations reviewed. Please refer to Health maintenance section.  Return to care in 1 year for next preventative visit.     Subjective:  HPI:  He has no acute complaints today.   Lifestyle Diet: Balanced.  Exercise: busy around the house.   Depression screen PHQ 2/9 10/09/2020  Decreased Interest 0  Down, Depressed, Hopeless 0  PHQ - 2 Score 0    Health Maintenance Due  Topic Date Due   Zoster Vaccines- Shingrix (1 of 2) Never done   TETANUS/TDAP  03/29/2013   COVID-19 Vaccine (3 - Booster for Pfizer series) 06/01/2020   HEMOGLOBIN A1C  10/08/2020     ROS: Per HPI, otherwise a complete review of systems was negative.   PMH:  The following were reviewed and entered/updated in epic: Past Medical History:  Diagnosis Date   Diabetes mellitus without complication ([place])    Hypercholesterolemia    Hypertension    Patient Active Problem List   Diagnosis Date Noted   CKD stage 3 secondary to diabetes ([place]) 04/05/2018   Heart murmur 07/22/2017   Hypertension associated with diabetes ([place]) 07/22/2017   Hyperlipidemia associated with type 2 diabetes mellitus ([place]) 07/22/2017   Hypothyroidism 07/22/2017   Chronic back pain 07/22/2017   DM2 (diabetes mellitus, type 2) ([place]) 01/14/2016   Past Surgical History:  Procedure Laterality Date   BACK SURGERY     HERNIA REPAIR      Family History  Problem Relation Age of Onset  Diabetes Mother    Heart attack Mother    Hypertension Mother    Arthritis Mother    Skin cancer Father    Arthritis Father    Breast cancer Sister     Medications- reviewed and updated Current Outpatient Medications  Medication Sig Dispense Refill   dapagliflozin propanediol (FARXIGA) 10 MG TABS tablet Take 1 tablet (10 mg total) by mouth  daily. 90 tablet 1   diclofenac sodium (VOLTAREN) 1 % GEL 2 (two) times daily. as directed  4   diclofenac Sodium (VOLTAREN) 1 % GEL Apply topically as directed for up to four times a day 30 days 200 g 5   glucose blood test strip USE 1 TO CHECK BLOOD SUGAR 2 TO 3 TIMES DAILY. 100 strip 12   glyBURIDE-metformin (GLUCOVANCE) 5-500 MG tablet Take 2 tablets by mouth 2 (two) times daily with a meal. 360 tablet 1   HYDROcodone-acetaminophen (NORCO) 10-325 MG tablet Take 1 tablet by mouth every 6 (six) hours as needed.     HYDROcodone-acetaminophen (NORCO) 10-325 MG tablet TAKE 1 TABLET BY MOUTH EVERY 6 HRS AS NEEEDD FILL 06/26/20 120 tablet 0   HYDROcodone-acetaminophen (NORCO) 10-325 MG tablet TAKE 1 TABLET BY MOUTH EVERY 6 HOURS AS NEEDED 120 tablet 0   HYDROcodone-acetaminophen (NORCO) 10-325 MG tablet TAKE 1 TABLET BY MOUTH EVERY 6 HOURS AS NEEDED 120 tablet 0   HYDROcodone-acetaminophen (NORCO) 10-325 MG tablet Take 1 tablet by mouth every 6 (six) hours as needed. 120 tablet 0   HYDROcodone-acetaminophen (NORCO) 10-325 MG tablet Take 1 tablet by mouth every 6 hours as needed 120 tablet 0   Lancets (FREESTYLE) lancets USE AS DIRECTED TO CHECK BLOOD SUGAR TWICE A DAY 200 each 1   levothyroxine (SYNTHROID) 150 MCG tablet Take 1 tablet (150 mcg total) by mouth daily before breakfast. 90 tablet 1   lisinopril (ZESTRIL) 10 MG tablet Take 1 tablet (10 mg total) by mouth 2 (two) times daily as directed. 180 tablet 1   nortriptyline (PAMELOR) 50 MG capsule Take 3 capsules (150 mg total) by mouth at bedtime. 270 capsule 1   Omega-3 Fatty Acids (FISH OIL) 1000 MG CAPS Take 1 capsule by mouth 2 (two) times daily.     oxyCODONE (OXYCONTIN) 20 mg 12 hr tablet Take 20 mg by mouth every 12 (twelve) hours.     oxyCODONE (OXYCONTIN) 30 MG 12 hr tablet Take 1 tablet (30 mg total) by mouth every 12 (twelve) hours. 60 tablet 0   oxyCODONE 30 MG 12 hr tablet TAKE 1 TABLET BY MOUTH EVERY 12 HOURS 60 tablet 0   oxyCODONE  30 MG 12 hr tablet TAKE 1 TABLET BY MOUTH EVERY 12 HOURS 60 tablet 0   oxyCODONE 30 MG 12 hr tablet TAKE 1 TABLET BY MOUTH EVERY 12 HOURS 60 tablet 0   potassium chloride (KLOR-CON) 10 MEQ tablet TAKE 1 TABLET BY MOUTH TWICE DAILY. 180 tablet 1   simvastatin (ZOCOR) 40 MG tablet Take 1 tablet (40 mg total) by mouth daily. 60 tablet 6   No current facility-administered medications for this visit.    Allergies-reviewed and updated Allergies  Allergen Reactions   Trulicity [Dulaglutide] Hives   Other     Stomach medicine, not sure of name. Had a mini stroke   Penicillins Swelling    Arm swelling Has patient had a PCN reaction causing immediate rash, facial/tongue/throat swelling, SOB or lightheadedness with hypotension: No Has patient had a PCN reaction causing severe rash involving mucus membranes  or skin necrosis: No Has patient had a PCN reaction that required hospitalization No Has patient had a PCN reaction occurring within the last 10 years: No If all of the above answers are "NO", then may proceed with Cephalosporin use.    Social History   Socioeconomic History   Marital status: Married    Spouse name: Not on file   Number of children: Not on file   Years of education: Not on file   Highest education level: Not on file  Occupational History   Not on file  Tobacco Use   Smoking status: Never   Smokeless tobacco: Never  Vaping Use   Vaping Use: Never used  Substance and Sexual Activity   Alcohol use: Never    Comment: occ beer    Drug use: No   Sexual activity: Not on file  Other Topics Concern   Not on file  Social History Narrative   Not on file   Social Determinants of Health   Financial Resource Strain: Not on file  Food Insecurity: Not on file  Transportation Needs: Not on file  Physical Activity: Not on file  Stress: Not on file  Social Connections: Not on file        Objective:  Physical Exam: BP 140/70   Pulse 77   Temp 98.1 F (36.7 C)  (Temporal)   Ht 6' (1.829 m)   Wt 237 lb (107.5 kg)   SpO2 96%   BMI 32.14 kg/m   Body mass index is 32.14 kg/m. Wt Readings from Last 3 Encounters:  10/09/20 237 lb (107.5 kg)  04/10/20 228 lb 12.8 oz (103.8 kg)  10/09/19 228 lb 3.2 oz (103.5 kg)   Gen: NAD, resting comfortably HEENT: TMs normal bilaterally. OP clear. No thyromegaly noted.  CV: RRR with 2/6 sysytolic murmurs appreciated Pulm: NWOB, CTAB with no crackles, wheezes, or rhonchi GI: Normal bowel sounds present. Soft, Nontender, Nondistended. MSK: no edema, cyanosis, or clubbing noted Skin: warm, dry Neuro: CN2-12 grossly intact. Strength 5/5 in upper and lower extremities. Reflexes symmetric and intact bilaterally.  Psych: Normal affect and thought content     Vergene Marland M. Jerline Pain, MD 10/09/2020 8:24 AM

## 2020-10-09 NOTE — Patient Instructions (Signed)
It was very nice to see you today!  We will check blood work today.   No medication changes today.  I will see back in 6 months.  Please come back to see me sooner if needed.    Take care, Dr Jimmey Ralph  PLEASE NOTE:  If you had any lab tests please let us know if you have not heard back within a few days. You may see your results on mychart before we have a chance to review them but we will give you a call once they are reviewed by [place]. If we ordered any referrals today, please let us know if you have not heard from their office within the next week.  + Please try these tips to maintain a healthy lifestyle:  Eat at least 3 REAL meals and 1-2 snacks per day.  Aim for no more than 5 hours between eating.  If you eat breakfast, please do so within one hour of getting up.   Each meal should contain half fruits/vegetables, one quarter protein, and one quarter carbs (no bigger than a computer mouse)  Cut down on sweet beverages. This includes juice, soda, and sweet tea.   Drink at least 1 glass of water with each meal and aim for at least 8 glasses per day  Exercise at least 150 minutes every week.    Preventive Care 66 Years and Older, Male Preventive care refers to lifestyle choices and visits with your health care provider that can promote health and wellness. This includes: A yearly physical exam. This is also called an annual wellness visit. Regular dental and eye exams. Immunizations. Screening for certain conditions. Healthy lifestyle choices, such as: Eating a healthy diet. Getting regular exercise. Not using drugs or products that contain nicotine and tobacco. Limiting alcohol use. What can I expect for my preventive care visit? Physical exam Your health care provider will check your: Height and weight. These may be used to calculate your BMI (body mass index). BMI is a measurement that tells if you are at a healthy weight. Heart rate and blood pressure. Body  temperature. Skin for abnormal spots. Counseling Your health care provider may ask you questions about your: Past medical problems. Family's medical history. Alcohol, tobacco, and drug use. Emotional well-being. Home life and relationship well-being. Sexual activity. Diet, exercise, and sleep habits. History of falls. Memory and ability to understand (cognition). Work and work Astronomer. Access to firearms. What immunizations do I need?  Vaccines are usually given at various ages, according to a schedule. Your health care provider will recommend vaccines for you based on your age, medicalhistory, and lifestyle or other factors, such as travel or where you work. What tests do I need? Blood tests Lipid and cholesterol levels. These may be checked every 5 years, or more often depending on your overall health. Hepatitis C test. Hepatitis B test. Screening Lung cancer screening. You may have this screening every year starting at age 62 if you have a 30-pack-year history of smoking and currently smoke or have quit within the past 15 years. Colorectal cancer screening. All adults should have this screening starting at age 18 and continuing until age 18. Your health care provider may recommend screening at age 67 if you are at increased risk. You will have tests every 1-10 years, depending on your results and the type of screening test. Prostate cancer screening. Recommendations will vary depending on your family history and other risks. Genital exam to check for testicular cancer or hernias.  Diabetes screening. This is done by checking your blood sugar (glucose) after you have not eaten for a while (fasting). You may have this done every 1-3 years. Abdominal aortic aneurysm (AAA) screening. You may need this if you are a current or former smoker. STD (sexually transmitted disease) testing, if you are at risk. Follow these instructions at home: Eating and drinking  Eat a diet that  includes fresh fruits and vegetables, whole grains, lean protein, and low-fat dairy products. Limit your intake of foods with high amounts of sugar, saturated fats, and salt. Take vitamin and mineral supplements as recommended by your health care provider. Do not drink alcohol if your health care provider tells you not to drink. If you drink alcohol: Limit how much you have to 0-2 drinks a day. Be aware of how much alcohol is in your drink. In the [place] one drink equals one 12 oz bottle of beer (355 mL), one 5 oz glass of wine (148 mL), or one 1 oz glass of hard liquor (44 mL).  Lifestyle Take daily care of your teeth and gums. Brush your teeth every morning and night with fluoride toothpaste. Floss one time each day. Stay active. Exercise for at least 30 minutes 5 or more days each week. Do not use any products that contain nicotine or tobacco, such as cigarettes, e-cigarettes, and chewing tobacco. If you need help quitting, ask your health care provider. Do not use drugs. If you are sexually active, practice safe sex. Use a condom or other form of protection to prevent STIs (sexually transmitted infections). Talk with your health care provider about taking a low-dose aspirin or statin. Find healthy ways to cope with stress, such as: Meditation, yoga, or listening to music. Journaling. Talking to a trusted person. Spending time with friends and family. Safety Always wear your seat belt while driving or riding in a vehicle. Do not drive: If you have been drinking alcohol. Do not ride with someone who has been drinking. When you are tired or distracted. While texting. Wear a helmet and other protective equipment during sports activities. If you have firearms in your house, make sure you follow all gun safety procedures. What's next? Visit your health care provider once a year for an annual wellness visit. Ask your health care provider how often you should have your eyes and teeth  checked. Stay up to date on all vaccines. This information is not intended to replace advice given to you by your health care provider. Make sure you discuss any questions you have with your healthcare provider. Document Revised: 12/12/2018 Document Reviewed: 03/09/2018 Elsevier Patient Education  2022 [employer].

## 2020-10-09 NOTE — Assessment & Plan Note (Signed)
Follows with rheumatology.  On chronic opioids.

## 2020-10-09 NOTE — Assessment & Plan Note (Signed)
Stable.  No red flag signs or symptoms.  Continue with watchful waiting.

## 2020-10-09 NOTE — Assessment & Plan Note (Signed)
Check TSH today.  He is on Synthroid 150 mcg daily.

## 2020-10-10 LAB — CBC
HCT: 44.6 % (ref 39.0–52.0)
Hemoglobin: 15.2 g/dL (ref 13.0–17.0)
MCHC: 34.1 g/dL (ref 30.0–36.0)
MCV: 91.9 fl (ref 78.0–100.0)
Platelets: 189 10*3/uL (ref 150.0–400.0)
RBC: 4.85 Mil/uL (ref 4.22–5.81)
RDW: 13.7 % (ref 11.5–15.5)
WBC: 7.4 10*3/uL (ref 4.0–10.5)

## 2020-10-13 ENCOUNTER — Telehealth: Payer: Self-pay

## 2020-10-13 ENCOUNTER — Other Ambulatory Visit: Payer: Self-pay

## 2020-10-13 MED ORDER — SITAGLIPTIN PHOSPHATE 100 MG PO TABS: 100.0000 mg | ORAL_TABLET | Freq: Every day | ORAL | 2 refills | Status: DC

## 2020-10-13 NOTE — Progress Notes (Signed)
Please inform patient of the following:  His A1c is above goal.  All of his other labs are stable.  Recommend starting Januvia 100 mg daily.  We should leave everything else the same.  I would like to see him back in 3 months to recheck A1c.

## 2020-10-13 NOTE — Telephone Encounter (Signed)
Pt called in stating that Dr Jimmey Ralph sent in a medication that he is allergic to. He stated that he had an allergic reaction to Januvia 11 years ago and he is nervous about taking it. Can something else be sent in?

## 2020-10-14 DIAGNOSIS — M2550 Pain in unspecified joint: Secondary | ICD-10-CM | POA: Diagnosis not present

## 2020-10-14 DIAGNOSIS — E669 Obesity, unspecified: Secondary | ICD-10-CM | POA: Diagnosis not present

## 2020-10-14 DIAGNOSIS — Z6831 Body mass index (BMI) 31.0-31.9, adult: Secondary | ICD-10-CM | POA: Diagnosis not present

## 2020-10-14 DIAGNOSIS — N183 Chronic kidney disease, stage 3 unspecified: Secondary | ICD-10-CM | POA: Diagnosis not present

## 2020-10-14 DIAGNOSIS — M150 Primary generalized (osteo)arthritis: Secondary | ICD-10-CM | POA: Diagnosis not present

## 2020-10-14 DIAGNOSIS — M542 Cervicalgia: Secondary | ICD-10-CM | POA: Diagnosis not present

## 2020-10-14 NOTE — Telephone Encounter (Signed)
See below

## 2020-10-14 NOTE — Telephone Encounter (Signed)
Can we clarify with patient? Trulicity is listed on his allergy list and not Januvia - I do not see where he has been on [place] in the past. Damiano Stamper M. Jimmey Ralph, MD 10/14/2020 2:17 PM

## 2020-10-14 NOTE — Telephone Encounter (Signed)
Did he also have a reaction to trulicity?  Please add januvia to his allergy list.  Katina Degree. Jimmey Ralph, MD 10/14/2020 2:42 PM

## 2020-10-14 NOTE — Telephone Encounter (Signed)
FYI,Called and spoke with pt and he states he does not want to be on insulin, he states he will watch what he eats and try to exercise.

## 2020-10-14 NOTE — Telephone Encounter (Signed)
Called and spoke with pt and he states while he was on [place] he only took 11 pills and woke up one morning and his eyes were red and painful.

## 2020-10-14 NOTE — Telephone Encounter (Signed)
Yes, trulicity is already on his allergy list it gave him hives. Januvia added to allergies.

## 2020-10-14 NOTE — Telephone Encounter (Signed)
Only other option at this point would be a basal insulin. Ok to send in lantus 10 units daily and would like for them to send [place] his fasting sugars in 1-2 weeks.  Katina Degree. Jimmey Ralph, MD 10/14/2020 3:33 PM

## 2020-10-24 ENCOUNTER — Other Ambulatory Visit (HOSPITAL_COMMUNITY): Payer: Self-pay

## 2020-10-24 MED ORDER — HYDROCODONE-ACETAMINOPHEN 10-325 MG PO TABS
1.0000 | ORAL_TABLET | Freq: Four times a day (QID) | ORAL | 0 refills | Status: DC | PRN
  Filled 2020-10-24: qty 23, 6d supply, fill #0
  Filled 2020-10-24: qty 97, 24d supply, fill #0

## 2020-10-24 MED ORDER — OXYCONTIN 30 MG PO T12A
30.0000 mg | EXTENDED_RELEASE_TABLET | Freq: Two times a day (BID) | ORAL | 0 refills | Status: DC
  Filled 2020-10-24: qty 60, 30d supply, fill #0

## 2020-10-27 ENCOUNTER — Telehealth: Payer: Self-pay

## 2020-10-27 MED ORDER — FREESTYLE LANCETS MISC: 1 refills | Status: DC

## 2020-10-27 MED ORDER — GLUCOSE BLOOD VI STRP: ORAL_STRIP | 12 refills | Status: DC

## 2020-10-27 NOTE — Telephone Encounter (Signed)
MEDICATION: Lancets (FREESTYLE) lancets  glucose blood test strip   PHARMACY:  [facility] [place], [place] - [address] Phone:  [phone]  Fax:  [phone]      Comments:   **Let patient know to contact pharmacy at the end of the day to make sure medication is ready. **  ** Please notify patient to allow 48-72 hours to process**  **Encourage patient to contact the pharmacy for refills or they can request refills through [facility]**

## 2020-10-27 NOTE — Telephone Encounter (Signed)
Refill sent to pharmacy.   

## 2020-10-28 ENCOUNTER — Other Ambulatory Visit (HOSPITAL_COMMUNITY): Payer: Self-pay

## 2020-10-28 NOTE — Telephone Encounter (Signed)
Patients wife stated the pharmacy didn't have the medication below and would now like them sent to : [id number] - [place], [place] - [address] Phone:  [phone]  Fax:  [phone]

## 2020-10-29 ENCOUNTER — Other Ambulatory Visit: Payer: Self-pay | Admitting: *Deleted

## 2020-10-29 MED ORDER — FREESTYLE LANCETS MISC: 1 refills | Status: DC

## 2020-10-29 NOTE — Telephone Encounter (Signed)
[facility] to [facility]

## 2020-11-26 ENCOUNTER — Telehealth: Payer: Self-pay

## 2020-11-26 ENCOUNTER — Other Ambulatory Visit: Payer: Self-pay | Admitting: Family Medicine

## 2020-11-26 MED ORDER — ONETOUCH ULTRA MINI W/DEVICE KIT: PACK | 3 refills | Status: DC

## 2020-11-26 MED ORDER — GLUCOSE BLOOD VI STRP: ORAL_STRIP | 12 refills | Status: DC

## 2020-11-26 NOTE — Telephone Encounter (Signed)
Patient wife return call, will call back with glucometer information

## 2020-11-26 NOTE — Telephone Encounter (Signed)
LVM to return call   Need information of glucometer insurance will pay for

## 2020-11-26 NOTE — Telephone Encounter (Signed)
Pt's wife called in requesting a new meter to check his blood sugar. She stated that their insurance does not cover the one they have now. Pt was wondering is Dr Jimmey Ralph could call in a new one? Please Advise.

## 2020-11-26 NOTE — Telephone Encounter (Signed)
Rx sent to pharmacy   

## 2020-11-26 NOTE — Telephone Encounter (Signed)
Pt's wife called back that he needs the One Touch Ultra Mini strips.

## 2020-11-27 MED ORDER — ONETOUCH VERIO W/DEVICE KIT: PACK | 3 refills | Status: DC

## 2020-12-29 ENCOUNTER — Other Ambulatory Visit: Payer: Self-pay

## 2020-12-29 MED ORDER — LEVOTHYROXINE SODIUM 150 MCG PO TABS: 150.0000 ug | ORAL_TABLET | Freq: Every day | ORAL | 1 refills | Status: DC

## 2020-12-30 ENCOUNTER — Telehealth: Payer: Self-pay

## 2020-12-30 MED ORDER — GLYBURIDE-METFORMIN 5-500 MG PO TABS: 2.0000 | ORAL_TABLET | Freq: Two times a day (BID) | ORAL | 1 refills | Status: DC

## 2020-12-30 NOTE — Telephone Encounter (Signed)
Refill has been sent.  °

## 2020-12-30 NOTE — Telephone Encounter (Signed)
  Encourage patient to contact the pharmacy for refills or they can request refills through [facility]  LAST APPOINTMENT DATE:  10/09/20  NEXT APPOINTMENT DATE: 01/14/21   MEDICATION:  glyBURIDE-metformin (GLUCOVANCE) 5-500 MG tablet  Is the patient out of medication? YES    PHARMACY:[id number] - [place], [place] - [address]  Let patient know to contact pharmacy at the end of the day to make sure medication is ready.  Please notify patient to allow 48-72 hours to process

## 2021-01-14 ENCOUNTER — Ambulatory Visit: Payer: Medicare HMO | Admitting: Family Medicine

## 2021-01-14 DIAGNOSIS — M150 Primary generalized (osteo)arthritis: Secondary | ICD-10-CM | POA: Diagnosis not present

## 2021-01-14 DIAGNOSIS — N183 Chronic kidney disease, stage 3 unspecified: Secondary | ICD-10-CM | POA: Diagnosis not present

## 2021-01-14 DIAGNOSIS — M2550 Pain in unspecified joint: Secondary | ICD-10-CM | POA: Diagnosis not present

## 2021-01-14 DIAGNOSIS — M5030 Other cervical disc degeneration, unspecified cervical region: Secondary | ICD-10-CM | POA: Diagnosis not present

## 2021-01-14 DIAGNOSIS — M5136 Other intervertebral disc degeneration, lumbar region: Secondary | ICD-10-CM | POA: Diagnosis not present

## 2021-01-15 ENCOUNTER — Ambulatory Visit (INDEPENDENT_AMBULATORY_CARE_PROVIDER_SITE_OTHER): Payer: Medicare Other | Admitting: Family Medicine

## 2021-01-15 ENCOUNTER — Other Ambulatory Visit: Payer: Self-pay

## 2021-01-15 ENCOUNTER — Encounter: Payer: Self-pay | Admitting: Family Medicine

## 2021-01-15 VITALS — BP 128/76 | HR 75 | Temp 98.0°F | Ht 72.0 in | Wt 224.4 lb

## 2021-01-15 DIAGNOSIS — E1159 Type 2 diabetes mellitus with other circulatory complications: Secondary | ICD-10-CM | POA: Diagnosis not present

## 2021-01-15 DIAGNOSIS — E1122 Type 2 diabetes mellitus with diabetic chronic kidney disease: Secondary | ICD-10-CM

## 2021-01-15 DIAGNOSIS — E1100 Type 2 diabetes mellitus with hyperosmolarity without nonketotic hyperglycemic-hyperosmolar coma (NKHHC): Secondary | ICD-10-CM | POA: Diagnosis not present

## 2021-01-15 DIAGNOSIS — M549 Dorsalgia, unspecified: Secondary | ICD-10-CM | POA: Diagnosis not present

## 2021-01-15 DIAGNOSIS — N183 Chronic kidney disease, stage 3 unspecified: Secondary | ICD-10-CM

## 2021-01-15 DIAGNOSIS — G8929 Other chronic pain: Secondary | ICD-10-CM

## 2021-01-15 DIAGNOSIS — I152 Hypertension secondary to endocrine disorders: Secondary | ICD-10-CM | POA: Diagnosis not present

## 2021-01-15 LAB — POCT GLYCOSYLATED HEMOGLOBIN (HGB A1C): Hemoglobin A1C: 7 % — AB (ref 4.0–5.6)

## 2021-01-15 NOTE — Assessment & Plan Note (Signed)
Follows with rheumatology.  Was told that he would no longer be able to get OxyContin.  He will be following up with pain clinic in a few weeks to discuss other treatment options.

## 2021-01-15 NOTE — Assessment & Plan Note (Signed)
At goal on lisinopril 10 mg twice daily. 

## 2021-01-15 NOTE — Patient Instructions (Signed)
It was very nice to see you today!  Your A1c is 7.0.  Keep up the good work!  We will see you back in 3 months.  Please come back sooner if needed.  Take care, Dr Jimmey Ralph  PLEASE NOTE:  If you had any lab tests please let us know if you have not heard back within a few days. You may see your results on mychart before we have a chance to review them but we will give you a call once they are reviewed by [place]. If we ordered any referrals today, please let us know if you have not heard from their office within the next week.   Please try these tips to maintain a healthy lifestyle:  Eat at least 3 REAL meals and 1-2 snacks per day.  Aim for no more than 5 hours between eating.  If you eat breakfast, please do so within one hour of getting up.   Each meal should contain half fruits/vegetables, one quarter protein, and one quarter carbs (no bigger than a computer mouse)  Cut down on sweet beverages. This includes juice, soda, and sweet tea.   Drink at least 1 glass of water with each meal and aim for at least 8 glasses per day  Exercise at least 150 minutes every week.

## 2021-01-15 NOTE — Progress Notes (Signed)
   Thomas [name] is a 65 y.o. male who presents today for an office visit.  Assessment/Plan:  Chronic Problems Addressed Today: DM2 (diabetes mellitus, type 2) (HCC) A1c 7.0.  He did not start Januvia.  We will continue current plan of Farxiga 10 mg daily and glyburide-metformin 04-998 twice daily.  Continue lifestyle modifications.  Follow-up in 3 months.  Hypertension associated with diabetes (HCC) At goal on lisinopril 10 mg twice daily.  Chronic back pain Follows with rheumatology.  Was told that he would no longer be able to get OxyContin.  He will be following up with pain clinic in a few weeks to discuss other treatment options.     Subjective:  HPI:  He is here to check his A1c. His A1c is stable today at 7. He is currently on Farxiga 10 mg daily and Glyburide- Metformin 1000 twice daily.  He is tolerating his medication with no side effects.        Objective:  Physical Exam: BP 128/76   Pulse 75   Temp 98 F (36.7 C) (Temporal)   Ht 6' (1.829 m)   Wt 224 lb 6.4 oz (101.8 kg)   SpO2 99%   BMI 30.43 kg/m   Gen: No acute distress, resting comfortably CV: Regular rate and rhythm with no murmurs appreciated Pulm: Normal work of breathing, clear to auscultation bilaterally with no crackles, wheezes, or rhonchi Neuro: Grossly normal, moves all extremities Psych: Normal affect and thought content       I,Savera Zaman,acting as a scribe for Jacquiline Doe, MD.,have documented all relevant documentation on the behalf of Jacquiline Doe, MD,as directed by  Jacquiline Doe, MD while in the presence of Jacquiline Doe, MD.   I, Jacquiline Doe, MD, have reviewed all documentation for this visit. The documentation on 01/15/21 for the exam, diagnosis, procedures, and orders are all accurate and complete.  Katina Degree. Jimmey Ralph, MD 01/15/2021 8:24 AM

## 2021-01-15 NOTE — Assessment & Plan Note (Signed)
A1c 7.0.  He did not start Januvia.  We will continue current plan of Farxiga 10 mg daily and glyburide-metformin 04-998 twice daily.  Continue lifestyle modifications.  Follow-up in 3 months.

## 2021-02-05 ENCOUNTER — Other Ambulatory Visit: Payer: Self-pay | Admitting: Family Medicine

## 2021-02-05 NOTE — Telephone Encounter (Signed)
Can we check to see what is covered? Ok to send in as separate prescriptions if needed.

## 2021-02-05 NOTE — Telephone Encounter (Signed)
Alternative Requested:NOT COVERED BY INSURANCE. 

## 2021-02-06 ENCOUNTER — Other Ambulatory Visit (HOSPITAL_COMMUNITY): Payer: Self-pay | Admitting: Neurology

## 2021-02-06 DIAGNOSIS — Z79891 Long term (current) use of opiate analgesic: Secondary | ICD-10-CM | POA: Diagnosis not present

## 2021-02-06 DIAGNOSIS — M545 Low back pain, unspecified: Secondary | ICD-10-CM | POA: Diagnosis not present

## 2021-02-06 DIAGNOSIS — M25561 Pain in right knee: Secondary | ICD-10-CM

## 2021-02-06 DIAGNOSIS — G894 Chronic pain syndrome: Secondary | ICD-10-CM | POA: Diagnosis not present

## 2021-02-06 DIAGNOSIS — M25562 Pain in left knee: Secondary | ICD-10-CM

## 2021-02-06 DIAGNOSIS — M542 Cervicalgia: Secondary | ICD-10-CM

## 2021-02-06 DIAGNOSIS — M961 Postlaminectomy syndrome, not elsewhere classified: Secondary | ICD-10-CM | POA: Diagnosis not present

## 2021-02-10 ENCOUNTER — Ambulatory Visit (HOSPITAL_COMMUNITY)
Admission: RE | Admit: 2021-02-10 | Discharge: 2021-02-10 | Disposition: A | Discharge status: Home / Self Care | Payer: Medicare Other | Source: Ambulatory Visit | Attending: Neurology | Admitting: Neurology

## 2021-02-10 ENCOUNTER — Other Ambulatory Visit: Payer: Self-pay

## 2021-02-10 DIAGNOSIS — M542 Cervicalgia: Secondary | ICD-10-CM | POA: Diagnosis not present

## 2021-02-10 DIAGNOSIS — M25561 Pain in right knee: Secondary | ICD-10-CM | POA: Diagnosis not present

## 2021-02-10 DIAGNOSIS — M1712 Unilateral primary osteoarthritis, left knee: Secondary | ICD-10-CM | POA: Diagnosis not present

## 2021-02-10 DIAGNOSIS — M545 Low back pain, unspecified: Secondary | ICD-10-CM | POA: Insufficient documentation

## 2021-02-10 DIAGNOSIS — M25562 Pain in left knee: Secondary | ICD-10-CM | POA: Diagnosis not present

## 2021-02-10 DIAGNOSIS — M47812 Spondylosis without myelopathy or radiculopathy, cervical region: Secondary | ICD-10-CM | POA: Diagnosis not present

## 2021-02-10 DIAGNOSIS — M1711 Unilateral primary osteoarthritis, right knee: Secondary | ICD-10-CM | POA: Diagnosis not present

## 2021-02-10 DIAGNOSIS — M4802 Spinal stenosis, cervical region: Secondary | ICD-10-CM | POA: Diagnosis not present

## 2021-02-24 ENCOUNTER — Other Ambulatory Visit: Payer: Self-pay | Admitting: Family Medicine

## 2021-03-10 ENCOUNTER — Encounter: Payer: Self-pay | Admitting: Orthopedic Surgery

## 2021-03-24 ENCOUNTER — Other Ambulatory Visit: Payer: Self-pay | Admitting: Family Medicine

## 2021-03-26 ENCOUNTER — Encounter: Payer: Self-pay | Admitting: Radiology

## 2021-03-30 ENCOUNTER — Other Ambulatory Visit: Payer: Self-pay | Admitting: Family Medicine

## 2021-04-13 ENCOUNTER — Encounter: Payer: Self-pay | Admitting: Family Medicine

## 2021-04-13 ENCOUNTER — Other Ambulatory Visit: Payer: Self-pay

## 2021-04-13 ENCOUNTER — Ambulatory Visit (INDEPENDENT_AMBULATORY_CARE_PROVIDER_SITE_OTHER): Payer: Medicare Other | Admitting: Family Medicine

## 2021-04-13 VITALS — BP 127/78 | HR 78 | Temp 98.0°F | Ht 72.0 in | Wt 220.4 lb

## 2021-04-13 DIAGNOSIS — M549 Dorsalgia, unspecified: Secondary | ICD-10-CM

## 2021-04-13 DIAGNOSIS — E039 Hypothyroidism, unspecified: Secondary | ICD-10-CM | POA: Diagnosis not present

## 2021-04-13 DIAGNOSIS — E1159 Type 2 diabetes mellitus with other circulatory complications: Secondary | ICD-10-CM

## 2021-04-13 DIAGNOSIS — G8929 Other chronic pain: Secondary | ICD-10-CM | POA: Diagnosis not present

## 2021-04-13 DIAGNOSIS — E1169 Type 2 diabetes mellitus with other specified complication: Secondary | ICD-10-CM

## 2021-04-13 DIAGNOSIS — E785 Hyperlipidemia, unspecified: Secondary | ICD-10-CM

## 2021-04-13 DIAGNOSIS — E1100 Type 2 diabetes mellitus with hyperosmolarity without nonketotic hyperglycemic-hyperosmolar coma (NKHHC): Secondary | ICD-10-CM

## 2021-04-13 DIAGNOSIS — I152 Hypertension secondary to endocrine disorders: Secondary | ICD-10-CM

## 2021-04-13 LAB — COMPREHENSIVE METABOLIC PANEL
ALT: 22 U/L (ref 0–53)
AST: 21 U/L (ref 0–37)
Albumin: 4.5 g/dL (ref 3.5–5.2)
Alkaline Phosphatase: 47 U/L (ref 39–117)
BUN: 20 mg/dL (ref 6–23)
CO2: 29 mEq/L (ref 19–32)
Calcium: 9.3 mg/dL (ref 8.4–10.5)
Chloride: 99 mEq/L (ref 96–112)
Creatinine, Ser: 1.4 mg/dL (ref 0.40–1.50)
GFR: 52.26 mL/min — ABNORMAL LOW (ref >60.00)
Glucose, Bld: 117 mg/dL — ABNORMAL HIGH (ref 70–99)
Potassium: 4.4 mEq/L (ref 3.5–5.1)
Sodium: 139 mEq/L (ref 135–145)
Total Bilirubin: 0.6 mg/dL (ref 0.2–1.2)
Total Protein: 7.2 g/dL (ref 6.0–8.3)

## 2021-04-13 LAB — POCT GLYCOSYLATED HEMOGLOBIN (HGB A1C): Hemoglobin A1C: 6.8 % — AB (ref 4.0–5.6)

## 2021-04-13 LAB — CBC
HCT: 48.7 % (ref 39.0–52.0)
Hemoglobin: 16.1 g/dL (ref 13.0–17.0)
MCHC: 33 g/dL (ref 30.0–36.0)
MCV: 93 fl (ref 78.0–100.0)
Platelets: 240 10*3/uL (ref 150.0–400.0)
RBC: 5.23 Mil/uL (ref 4.22–5.81)
RDW: 13.9 % (ref 11.5–15.5)
WBC: 8.7 10*3/uL (ref 4.0–10.5)

## 2021-04-13 LAB — LIPID PANEL
Cholesterol: 116 mg/dL (ref 0–200)
HDL: 35.5 mg/dL — ABNORMAL LOW (ref >39.00)
LDL Cholesterol: 50 mg/dL (ref 0–99)
NonHDL: 80.67
Total CHOL/HDL Ratio: 3
Triglycerides: 154 mg/dL — ABNORMAL HIGH (ref 0.0–149.0)
VLDL: 30.8 mg/dL (ref 0.0–40.0)

## 2021-04-13 LAB — TSH: TSH: 0.68 u[IU]/mL (ref 0.35–5.50)

## 2021-04-13 NOTE — Progress Notes (Signed)
° °  Thomas [name] is a 66 y.o. male who presents today for an office visit.  Assessment/Plan:  Chronic Problems Addressed Today: DM2 (diabetes mellitus, type 2) (HCC) A1c well controlled 6.8.  Continue Farxiga 10 mg daily and glyburide-metformin 04-998 twice daily.  Chronic back pain Is reestablished with pain management.  He is on hydrocodone and hydromorphone.  Hypothyroidism Check TSH.  Continue Synthroid 150 micrograms daily.  Hyperlipidemia associated with type 2 diabetes mellitus (HCC) Check lipids. Continue simvastatin 40mg  daily.   Hypertension associated with diabetes ([place]) At goal on lisinopril 10mg  twice daily.     Subjective:  HPI:  See A/p for status of chronic conditions.         Objective:  Physical Exam: BP 127/78    Pulse 78    Temp 98 F (36.7 C) (Temporal)    Ht 6' (1.829 m)    Wt 220 lb 6.4 oz (100 kg)    SpO2 96%    BMI 29.89 kg/m   Gen: No acute distress, resting comfortably CV: Regular rate and rhythm with no murmurs appreciated Pulm: Normal work of breathing, clear to auscultation bilaterally with no crackles, wheezes, or rhonchi Neuro: Grossly normal, moves all extremities Psych: Normal affect and thought content      Tricia Pledger M. Jerline Pain, MD 04/13/2021 8:57 AM

## 2021-04-13 NOTE — Progress Notes (Signed)
Please inform patient of the following:  Good news! Labs are all stable. Do not need to make any changes to his treatment plan at this time. Would like for him to keep up the good work and we can recheck in 6 months.  Katina Degree. Jimmey Ralph, MD 04/13/2021 3:11 PM

## 2021-04-13 NOTE — Assessment & Plan Note (Signed)
Check TSH.  Continue Synthroid 150 micrograms daily.

## 2021-04-13 NOTE — Assessment & Plan Note (Signed)
A1c well controlled 6.8.  Continue Farxiga 10 mg daily and glyburide-metformin 04-998 twice daily.

## 2021-04-13 NOTE — Assessment & Plan Note (Signed)
Is reestablished with pain management.  He is on hydrocodone and hydromorphone.

## 2021-04-13 NOTE — Assessment & Plan Note (Signed)
Check lipids.  Continue simvastatin 40 mg daily. 

## 2021-04-13 NOTE — Patient Instructions (Signed)
It was very nice to see you today!  Your A1c looks good.  We will check blood work today.  Please come back in 6 months.  Come back sooner if needed.  Take care, Dr Jimmey Ralph  PLEASE NOTE:  If you had any lab tests please let us know if you have not heard back within a few days. You may see your results on mychart before we have a chance to review them but we will give you a call once they are reviewed by [place]. If we ordered any referrals today, please let us know if you have not heard from their office within the next week.   Please try these tips to maintain a healthy lifestyle:  Eat at least 3 REAL meals and 1-2 snacks per day.  Aim for no more than 5 hours between eating.  If you eat breakfast, please do so within one hour of getting up.   Each meal should contain half fruits/vegetables, one quarter protein, and one quarter carbs (no bigger than a computer mouse)  Cut down on sweet beverages. This includes juice, soda, and sweet tea.   Drink at least 1 glass of water with each meal and aim for at least 8 glasses per day  Exercise at least 150 minutes every week.

## 2021-04-13 NOTE — Assessment & Plan Note (Signed)
At goal on lisinopril 10 mg twice daily. 

## 2021-04-23 ENCOUNTER — Other Ambulatory Visit: Payer: Self-pay | Admitting: Family Medicine

## 2021-04-23 DIAGNOSIS — M961 Postlaminectomy syndrome, not elsewhere classified: Secondary | ICD-10-CM | POA: Diagnosis not present

## 2021-04-23 DIAGNOSIS — Z79891 Long term (current) use of opiate analgesic: Secondary | ICD-10-CM | POA: Diagnosis not present

## 2021-04-23 DIAGNOSIS — M542 Cervicalgia: Secondary | ICD-10-CM | POA: Diagnosis not present

## 2021-06-14 ENCOUNTER — Other Ambulatory Visit: Payer: Self-pay | Admitting: Family Medicine

## 2021-06-15 ENCOUNTER — Other Ambulatory Visit: Payer: Self-pay | Admitting: Family Medicine

## 2021-06-18 DIAGNOSIS — Z79891 Long term (current) use of opiate analgesic: Secondary | ICD-10-CM | POA: Diagnosis not present

## 2021-06-18 DIAGNOSIS — M25561 Pain in right knee: Secondary | ICD-10-CM | POA: Diagnosis not present

## 2021-06-18 DIAGNOSIS — M542 Cervicalgia: Secondary | ICD-10-CM | POA: Diagnosis not present

## 2021-06-18 DIAGNOSIS — M25562 Pain in left knee: Secondary | ICD-10-CM | POA: Diagnosis not present

## 2021-06-18 DIAGNOSIS — M545 Low back pain, unspecified: Secondary | ICD-10-CM | POA: Diagnosis not present

## 2021-06-18 DIAGNOSIS — M961 Postlaminectomy syndrome, not elsewhere classified: Secondary | ICD-10-CM | POA: Diagnosis not present

## 2021-07-24 ENCOUNTER — Other Ambulatory Visit: Payer: Self-pay | Admitting: Family Medicine

## 2021-08-06 ENCOUNTER — Other Ambulatory Visit: Payer: Self-pay | Admitting: Neurosurgery

## 2021-08-06 DIAGNOSIS — M542 Cervicalgia: Secondary | ICD-10-CM | POA: Diagnosis not present

## 2021-08-15 ENCOUNTER — Ambulatory Visit
Admission: RE | Admit: 2021-08-15 | Discharge: 2021-08-15 | Disposition: A | Discharge status: Home / Self Care | Payer: Medicare Other | Source: Ambulatory Visit | Attending: Neurosurgery | Admitting: Neurosurgery

## 2021-08-15 DIAGNOSIS — M542 Cervicalgia: Secondary | ICD-10-CM

## 2021-08-15 DIAGNOSIS — M4802 Spinal stenosis, cervical region: Secondary | ICD-10-CM | POA: Diagnosis not present

## 2021-08-17 DIAGNOSIS — Z79891 Long term (current) use of opiate analgesic: Secondary | ICD-10-CM | POA: Diagnosis not present

## 2021-08-17 DIAGNOSIS — M25561 Pain in right knee: Secondary | ICD-10-CM | POA: Diagnosis not present

## 2021-08-17 DIAGNOSIS — M545 Low back pain, unspecified: Secondary | ICD-10-CM | POA: Diagnosis not present

## 2021-08-17 DIAGNOSIS — M542 Cervicalgia: Secondary | ICD-10-CM | POA: Diagnosis not present

## 2021-08-17 DIAGNOSIS — M961 Postlaminectomy syndrome, not elsewhere classified: Secondary | ICD-10-CM | POA: Diagnosis not present

## 2021-08-25 ENCOUNTER — Telehealth: Payer: Self-pay

## 2021-08-25 NOTE — Telephone Encounter (Signed)
States insurance will not cover Glyburide -metformin.  Would like to know if something else can be sent to [facility] in [place]?

## 2021-08-25 NOTE — Telephone Encounter (Signed)
Do they give any alternatives? We can send in as separate prescriptions if needed.  Thomas [name]. Jimmey Ralph, MD 08/25/2021 12:38 PM

## 2021-08-25 NOTE — Telephone Encounter (Signed)
Please advise 

## 2021-08-25 NOTE — Telephone Encounter (Signed)
Spoke with patient, patient agreed to send in as separate prescription

## 2021-08-26 ENCOUNTER — Other Ambulatory Visit: Payer: Self-pay | Admitting: Family Medicine

## 2021-08-26 ENCOUNTER — Other Ambulatory Visit: Payer: Self-pay | Admitting: *Deleted

## 2021-08-26 MED ORDER — METFORMIN HCL 1000 MG PO TABS: 1000.0000 mg | ORAL_TABLET | Freq: Two times a day (BID) | ORAL | 0 refills | Status: DC

## 2021-08-26 MED ORDER — GLYBURIDE 5 MG PO TABS: 10.0000 mg | ORAL_TABLET | Freq: Two times a day (BID) | ORAL | 0 refills | Status: DC

## 2021-08-26 NOTE — Telephone Encounter (Signed)
Ok with me. Please place any necessary orders. 

## 2021-08-26 NOTE — Telephone Encounter (Signed)
Rx send to [facility]  

## 2021-08-27 DIAGNOSIS — M542 Cervicalgia: Secondary | ICD-10-CM | POA: Diagnosis not present

## 2021-09-02 ENCOUNTER — Other Ambulatory Visit: Payer: Self-pay | Admitting: Neurosurgery

## 2021-09-18 ENCOUNTER — Other Ambulatory Visit: Payer: Self-pay | Admitting: Family Medicine

## 2021-09-19 ENCOUNTER — Other Ambulatory Visit: Payer: Self-pay | Admitting: Family Medicine

## 2021-09-24 ENCOUNTER — Other Ambulatory Visit: Payer: Self-pay | Admitting: Family Medicine

## 2021-09-26 ENCOUNTER — Other Ambulatory Visit: Payer: Self-pay | Admitting: Family Medicine

## 2021-09-30 ENCOUNTER — Telehealth: Payer: Self-pay | Admitting: Family Medicine

## 2021-09-30 NOTE — Telephone Encounter (Signed)
Copied from CRM [id number]. Topic: Medicare AWV >> Sep 30, 2021 11:23 AM Payton Doughty wrote: Reason for CRM: Left message for patient to schedule Annual Wellness Visit.  Please schedule with Nurse Health Advisor Lanier Ensign, RN at [facility]. This appt can be telephone or office visit. Please call [phone] ask for Olegario Messier.  Mailbox full

## 2021-10-02 ENCOUNTER — Other Ambulatory Visit: Payer: Self-pay | Admitting: Family Medicine

## 2021-10-06 ENCOUNTER — Ambulatory Visit (INDEPENDENT_AMBULATORY_CARE_PROVIDER_SITE_OTHER): Payer: Medicare Other | Admitting: Family Medicine

## 2021-10-06 ENCOUNTER — Encounter: Payer: Self-pay | Admitting: Family Medicine

## 2021-10-06 VITALS — BP 108/62 | HR 75 | Temp 97.5°F | Ht 72.0 in | Wt 211.8 lb

## 2021-10-06 DIAGNOSIS — E785 Hyperlipidemia, unspecified: Secondary | ICD-10-CM | POA: Diagnosis not present

## 2021-10-06 DIAGNOSIS — E1100 Type 2 diabetes mellitus with hyperosmolarity without nonketotic hyperglycemic-hyperosmolar coma (NKHHC): Secondary | ICD-10-CM

## 2021-10-06 DIAGNOSIS — E1169 Type 2 diabetes mellitus with other specified complication: Secondary | ICD-10-CM | POA: Diagnosis not present

## 2021-10-06 DIAGNOSIS — E1159 Type 2 diabetes mellitus with other circulatory complications: Secondary | ICD-10-CM | POA: Diagnosis not present

## 2021-10-06 DIAGNOSIS — I152 Hypertension secondary to endocrine disorders: Secondary | ICD-10-CM | POA: Diagnosis not present

## 2021-10-06 LAB — LIPID PANEL
Cholesterol: 123 mg/dL (ref 0–200)
HDL: 33 mg/dL — ABNORMAL LOW (ref >39.00)
LDL Cholesterol: 59 mg/dL (ref 0–99)
NonHDL: 90.38
Total CHOL/HDL Ratio: 4
Triglycerides: 158 mg/dL — ABNORMAL HIGH (ref 0.0–149.0)
VLDL: 31.6 mg/dL (ref 0.0–40.0)

## 2021-10-06 LAB — COMPREHENSIVE METABOLIC PANEL
ALT: 24 U/L (ref 0–53)
AST: 21 U/L (ref 0–37)
Albumin: 4.4 g/dL (ref 3.5–5.2)
Alkaline Phosphatase: 56 U/L (ref 39–117)
BUN: 41 mg/dL — ABNORMAL HIGH (ref 6–23)
CO2: 24 mEq/L (ref 19–32)
Calcium: 9.6 mg/dL (ref 8.4–10.5)
Chloride: 99 mEq/L (ref 96–112)
Creatinine, Ser: 1.66 mg/dL — ABNORMAL HIGH (ref 0.40–1.50)
GFR: 42.46 mL/min — ABNORMAL LOW (ref >60.00)
Glucose, Bld: 189 mg/dL — ABNORMAL HIGH (ref 70–99)
Potassium: 5 mEq/L (ref 3.5–5.1)
Sodium: 134 mEq/L — ABNORMAL LOW (ref 135–145)
Total Bilirubin: 0.8 mg/dL (ref 0.2–1.2)
Total Protein: 7.1 g/dL (ref 6.0–8.3)

## 2021-10-06 LAB — CBC
HCT: 49.2 % (ref 39.0–52.0)
Hemoglobin: 16.4 g/dL (ref 13.0–17.0)
MCHC: 33.4 g/dL (ref 30.0–36.0)
MCV: 91.8 fl (ref 78.0–100.0)
Platelets: 200 10*3/uL (ref 150.0–400.0)
RBC: 5.36 Mil/uL (ref 4.22–5.81)
RDW: 14.1 % (ref 11.5–15.5)
WBC: 9.3 10*3/uL (ref 4.0–10.5)

## 2021-10-06 LAB — POCT GLYCOSYLATED HEMOGLOBIN (HGB A1C): Hemoglobin A1C: 9.4 % — AB (ref 4.0–5.6)

## 2021-10-06 LAB — TSH: TSH: 1.67 u[IU]/mL (ref 0.35–5.50)

## 2021-10-06 MED ORDER — GLIMEPIRIDE 4 MG PO TABS: 4.0000 mg | ORAL_TABLET | Freq: Every day | ORAL | 3 refills | Status: DC

## 2021-10-06 NOTE — Assessment & Plan Note (Signed)
Check lipids.  Continue simvastatin 40 mg daily. 

## 2021-10-06 NOTE — Assessment & Plan Note (Signed)
At goal on Lisinopril 10 mg twice daily.

## 2021-10-06 NOTE — Assessment & Plan Note (Signed)
A1c elevated 9.4 likely due to being off medications for the last couple of months.  We will continue Farxiga 10 mg daily.  He will continue metformin 1000 mg twice daily as well.  We will send in glimepiride 4mg  daily instead of his glyburide due to more favorable side effect profile and adults over age 67.  He will come back in 3 months to recheck A1c.

## 2021-10-06 NOTE — Patient Instructions (Signed)
It was very nice to see you today!  Your A1c is 9.4.  Please start the glimepiride instead of glyburide.  Continue the [place] and metformin.  Please continue to work on diet and exercise.  I will see you back in 3 months to recheck your A1c.  Please come back to see me sooner if needed.  Take care, Dr Jimmey Ralph  PLEASE NOTE:  If you had any lab tests please let us know if you have not heard back within a few days. You may see your results on mychart before we have a chance to review them but we will give you a call once they are reviewed by [place]. If we ordered any referrals today, please let us know if you have not heard from their office within the next week.   Please try these tips to maintain a healthy lifestyle:  Eat at least 3 REAL meals and 1-2 snacks per day.  Aim for no more than 5 hours between eating.  If you eat breakfast, please do so within one hour of getting up.   Each meal should contain half fruits/vegetables, one quarter protein, and one quarter carbs (no bigger than a computer mouse)  Cut down on sweet beverages. This includes juice, soda, and sweet tea.   Drink at least 1 glass of water with each meal and aim for at least 8 glasses per day  Exercise at least 150 minutes every week.

## 2021-10-06 NOTE — Progress Notes (Signed)
   Thomas [name] is a 66 y.o. male who presents today for an office visit.  Assessment/Plan:  Chronic Problems Addressed Today: DM2 (diabetes mellitus, type 2) (HCC) A1c elevated 9.4 likely due to being off medications for the last couple of months.  We will continue Farxiga 10 mg daily.  He will continue metformin 1000 mg twice daily as well.  We will send in glimepiride 4mg  daily instead of his glyburide due to more favorable side effect profile and adults over age 58.  He will come back in 3 months to recheck A1c.  Hyperlipidemia associated with type 2 diabetes mellitus (HCC) Check lipids.  Continue simvastatin 40 mg daily.  Hypertension associated with diabetes (HCC) At goal on Lisinopril 10 mg twice daily.     Subjective:  HPI:  See A/P for status of chronic conditions.  He is here today for diabetes follow-up.  Since his last visit he has had issues with the medication.  Reportedly pharmacy would not give him his glyburide due to concern for interaction with his pain medications.  He was also off metformin for several weeks.  Is recently been back on metformin.  Has been compliant with 76.  Fasting sugar was 158 this morning.       Objective:  Physical Exam: BP 108/62   Pulse 75   Temp (!) 97.5 F (36.4 C) (Temporal)   Ht 6' (1.829 m)   Wt 211 lb 12.8 oz (96.1 kg)   SpO2 98%   BMI 28.73 kg/m   Gen: No acute distress, resting comfortably CV: Regular rate and rhythm with 2/6 systolic murmurs appreciated Pulm: Normal work of breathing, clear to auscultation bilaterally with no crackles, wheezes, or rhonchi Neuro: Grossly normal, moves all extremities Psych: Normal affect and thought content      Thomas [name] M. [place], MD 10/06/2021 8:26 AM

## 2021-10-07 DIAGNOSIS — M542 Cervicalgia: Secondary | ICD-10-CM | POA: Diagnosis not present

## 2021-10-09 NOTE — Progress Notes (Signed)
Please inform patient of the following:  Labs are all stable. Do not need to make any changes to his treatment plan at this time. Would like for him to keep up the good work and we can recheck in 6 months.  Katina Degree. Jimmey Ralph, MD 10/09/2021 11:09 AM

## 2021-10-12 ENCOUNTER — Ambulatory Visit: Payer: Medicare Other | Admitting: Family Medicine

## 2021-10-12 ENCOUNTER — Encounter (HOSPITAL_COMMUNITY): Payer: Self-pay | Admitting: Certified Registered Nurse Anesthetist

## 2021-10-12 ENCOUNTER — Encounter (HOSPITAL_COMMUNITY)
Admission: RE | Admit: 2021-10-12 | Discharge: 2021-10-12 | Disposition: A | Discharge status: Home / Self Care | Payer: Medicare Other | Source: Ambulatory Visit | Attending: Neurosurgery | Admitting: Neurosurgery

## 2021-10-12 ENCOUNTER — Other Ambulatory Visit: Payer: Self-pay

## 2021-10-12 ENCOUNTER — Encounter (HOSPITAL_COMMUNITY): Payer: Self-pay | Admitting: Emergency Medicine

## 2021-10-12 ENCOUNTER — Encounter (HOSPITAL_COMMUNITY): Payer: Self-pay

## 2021-10-12 VITALS — BP 99/66 | HR 84 | Temp 98.5°F | Resp 17 | Ht 72.0 in | Wt 212.1 lb

## 2021-10-12 DIAGNOSIS — Z01818 Encounter for other preprocedural examination: Secondary | ICD-10-CM | POA: Insufficient documentation

## 2021-10-12 DIAGNOSIS — E119 Type 2 diabetes mellitus without complications: Secondary | ICD-10-CM | POA: Insufficient documentation

## 2021-10-12 LAB — TYPE AND SCREEN
ABO/RH(D): O POS
Antibody Screen: NEGATIVE

## 2021-10-12 LAB — SURGICAL PCR SCREEN
MRSA, PCR: NEGATIVE
Staphylococcus aureus: POSITIVE — AB

## 2021-10-12 LAB — GLUCOSE, CAPILLARY: Glucose-Capillary: 182 mg/dL — ABNORMAL HIGH (ref 70–99)

## 2021-10-12 NOTE — Progress Notes (Signed)
PCP - Dr. Jacquiline Doe Cardiologist - denies  PPM/ICD - n/a  Chest x-ray - n/a EKG - 10/12/21 Stress Test - 5+ years. Pt states normal. No f/u required. ECHO - 01/05/13 Cardiac Cath - denies  Sleep Study - denies CPAP - denies  CBG: 182 Fasting Blood Sugar - 90-175 Checks Blood Sugar 2 times a day A1C on 10/06/21-9.4  Blood Thinner Instructions: n/a Aspirin Instructions: n/a  NPO at MD  COVID TEST- n/a   Anesthesia review: Yes, ekg review. A1C 9.4  Patient denies shortness of breath, fever, cough and chest pain at PAT appointment   All instructions explained to the patient, with a verbal understanding of the material. Patient agrees to go over the instructions while at home for a better understanding. Patient also instructed to self quarantine after being tested for COVID-19. The opportunity to ask questions was provided.

## 2021-10-12 NOTE — Pre-Procedure Instructions (Signed)
Surgical Instructions    Your procedure is scheduled on Friday, July 21st.  Report to [facility] Main Entrance "A" at 05:30 A.M., then check in with the Admitting office.  Call this number if you have problems the morning of surgery:  [phone]   If you have any questions prior to your surgery date call [phone]: Open Monday-Friday 8am-4pm    Remember:  Do not eat or drink after midnight the night before your surgery     Take these medicines the morning of surgery with A SIP OF WATER  gabapentin (NEURONTIN) levothyroxine (SYNTHROID)  simvastatin (ZOCOR)   If needed: HYDROcodone-acetaminophen (NORCO) HYDROmorphone (DILAUDID)    WHAT DO I DO ABOUT MY DIABETES MEDICATION?   Do not take FARXIGA the day before surgery (7/20) or the morning of surgery (7/21)  Do not take metFORMIN (GLUCOPHAGE) or glimepiride (AMARYL) the morning of surgery (7/21).        HOW TO MANAGE YOUR DIABETES BEFORE AND AFTER SURGERY  Why is it important to control my blood sugar before and after surgery? Improving blood sugar levels before and after surgery helps healing and can limit problems. A way of improving blood sugar control is eating a healthy diet by:  Eating less sugar and carbohydrates  Increasing activity/exercise  Talking with your doctor about reaching your blood sugar goals High blood sugars (greater than 180 mg/dL) can raise your risk of infections and slow your recovery, so you will need to focus on controlling your diabetes during the weeks before surgery. Make sure that the doctor who takes care of your diabetes knows about your planned surgery including the date and location.  How do I manage my blood sugar before surgery? Check your blood sugar at least 4 times a day, starting 2 days before surgery, to make sure that the level is not too high or low.  Check your blood sugar the morning of your surgery when you wake up and every 2 hours until you get to the Short Stay  unit.  If your blood sugar is less than 70 mg/dL, you will need to treat for low blood sugar: Do not take insulin. Treat a low blood sugar (less than 70 mg/dL) with  cup of clear juice (cranberry or apple), 4 glucose tablets, OR glucose gel. Recheck blood sugar in 15 minutes after treatment (to make sure it is greater than 70 mg/dL). If your blood sugar is not greater than 70 mg/dL on recheck, call [phone] for further instructions. Report your blood sugar to the short stay nurse when you get to Short Stay.  If you are admitted to the hospital after surgery: Your blood sugar will be checked by the staff and you will probably be given insulin after surgery (instead of oral diabetes medicines) to make sure you have good blood sugar levels. The goal for blood sugar control after surgery is 80-180 mg/dL.    As of today, STOP taking any Aspirin (unless otherwise instructed by your surgeon) Aleve, Naproxen, Ibuprofen, Motrin, Advil, Goody's, BC's, all herbal medications, diclofenac sodium (VOLTAREN) 1 % GEL, fish oil, and all vitamins.                     Do NOT Smoke (Tobacco/Vaping) for 24 hours prior to your procedure.  If you use a CPAP at night, you may bring your mask/headgear for your overnight stay.   Contacts, glasses, piercing's, hearing aid's, dentures or partials may not be worn into surgery, please bring cases  for these belongings.    For patients admitted to the hospital, discharge time will be determined by your treatment team.   Patients discharged the day of surgery will not be allowed to drive home, and someone needs to stay with them for 24 hours.  SURGICAL WAITING ROOM VISITATION Patients having surgery or a procedure may have two support people in the waiting room. These visitors may be switched out with other visitors if needed. Children under the age of 95 must have an adult accompany them who is not the patient. If the patient needs to stay at the hospital during  part of their recovery, the visitor guidelines for inpatient rooms apply.  Please refer to the [facility] website for the visitor guidelines for Inpatients (after your surgery is over and you are in a regular room).    Special instructions:   [place]- Preparing For Surgery  Before surgery, you can play an important role. Because skin is not sterile, your skin needs to be as free of germs as possible. You can reduce the number of germs on your skin by washing with CHG (chlorahexidine gluconate) Soap before surgery.  CHG is an antiseptic cleaner which kills germs and bonds with the skin to continue killing germs even after washing.    Oral Hygiene is also important to reduce your risk of infection.  Remember - BRUSH YOUR TEETH THE MORNING OF SURGERY WITH YOUR REGULAR TOOTHPASTE  Please do not use if you have an allergy to CHG or antibacterial soaps. If your skin becomes reddened/irritated stop using the CHG.  Do not shave (including legs and underarms) for at least 48 hours prior to first CHG shower. It is OK to shave your face.  Please follow these instructions carefully.   Shower the NIGHT BEFORE SURGERY and the MORNING OF SURGERY  If you chose to wash your hair, wash your hair first as usual with your normal shampoo.  After you shampoo, rinse your hair and body thoroughly to remove the shampoo.  Use CHG Soap as you would any other liquid soap. You can apply CHG directly to the skin and wash gently with a scrungie or a clean washcloth.   Apply the CHG Soap to your body ONLY FROM THE NECK DOWN.  Do not use on open wounds or open sores. Avoid contact with your eyes, ears, mouth and genitals (private parts). Wash Face and genitals (private parts)  with your normal soap.   Wash thoroughly, paying special attention to the area where your surgery will be performed.  Thoroughly rinse your body with warm water from the neck down.  DO NOT shower/wash with your normal soap after using and  rinsing off the CHG Soap.  Pat yourself dry with a CLEAN TOWEL.  Wear CLEAN PAJAMAS to bed the night before surgery  Place CLEAN SHEETS on your bed the night before your surgery  DO NOT SLEEP WITH PETS.   Day of Surgery: Take a shower with CHG soap. Do not wear jewelry  Do not wear lotions, powders, colognes, or deodorant. Men may shave face and neck. Do not bring valuables to the hospital. [facility] is not responsible for any belongings or valuables.  Wear Clean/Comfortable clothing the morning of surgery Remember to brush your teeth WITH YOUR REGULAR TOOTHPASTE.   Please read over the following fact sheets that you were given.    If you received a COVID test during your pre-op visit  it is requested that you wear a mask  when out in public, stay away from anyone that may not be feeling well and notify your surgeon if you develop symptoms. If you have been in contact with anyone that has tested positive in the last 10 days please notify you surgeon.

## 2021-10-13 ENCOUNTER — Telehealth: Payer: Self-pay | Admitting: Family Medicine

## 2021-10-13 DIAGNOSIS — M961 Postlaminectomy syndrome, not elsewhere classified: Secondary | ICD-10-CM | POA: Diagnosis not present

## 2021-10-13 DIAGNOSIS — M25562 Pain in left knee: Secondary | ICD-10-CM | POA: Diagnosis not present

## 2021-10-13 DIAGNOSIS — M545 Low back pain, unspecified: Secondary | ICD-10-CM | POA: Diagnosis not present

## 2021-10-13 DIAGNOSIS — M25561 Pain in right knee: Secondary | ICD-10-CM | POA: Diagnosis not present

## 2021-10-13 DIAGNOSIS — Z79891 Long term (current) use of opiate analgesic: Secondary | ICD-10-CM | POA: Diagnosis not present

## 2021-10-13 DIAGNOSIS — M542 Cervicalgia: Secondary | ICD-10-CM | POA: Diagnosis not present

## 2021-10-13 NOTE — Telephone Encounter (Signed)
Patient states he is scheduled for an infusion on Friday with Dr. Wynetta Emery.  States he spoke with someone at [facility] stating that he needed to reach out to Dr. Jimmey Ralph to order a heart echo to have completed before Friday.  States he does not remember who he spoke to at [facility].  States they informed him that they have been talking to Dr. Jimmey Ralph.    Please advise.

## 2021-10-13 NOTE — Progress Notes (Addendum)
Anesthesia Chart Review:  Review of patient history shows he had an echo in October 2014 that showed moderate aortic stenosis with mean gradient of 20 mmHg.  I do not see follow-up on this.  I reached out to his PCP Dr. Jacquiline Doe and advised the patient will need to have follow-up echo prior to undergoing elective surgery.  Dr. Jimmey Ralph stated that he would help facilitate this.  Ability to proceed with surgery pending updated echocardiogram.  I have also reached out to Dr. Lonie Peak surgical scheduler to make him aware of the situation.  CMP and CBC from 10/06/2021 reviewed, creatinine elevated at 1.66 consistent with history of CKD (baseline creatinine ~1.5), uncontrolled non-insulin-dependent DM2 with A1c 9.4.  Addendum 10/15/2021: Echo done today shows progression of aortic stenosis, now severe, mean gradient 39 mmHg and valve area 0.82 cm.  I reached out to [facility] Dr. Lonie Peak office to advise patient will need cardiology evaluation prior to any elective surgery.  I also reached out to Dr. Lavone Neri office and left a message with his assistant to make sure he is aware of these results and the patient will require referral to structural heart team.  Antionette Poles, PA-C [facility] Short Stay Center/Anesthesiology Phone [phone] 10/13/2021 3:21 PM

## 2021-10-13 NOTE — Telephone Encounter (Signed)
Thomas [name] with [facility] stated that Patient needs ECHO before surgery on 10/16/21. After speaking with Thomas [name], Thomas [name] has decided to ask Thomas [name] to order this and have it expedited. Thomas [name] has agreed to call back if unable to get this ordered. FYI.Marland KitchenMarland Kitchen

## 2021-10-14 ENCOUNTER — Other Ambulatory Visit (HOSPITAL_COMMUNITY): Payer: Self-pay | Admitting: Neurology

## 2021-10-14 DIAGNOSIS — I350 Nonrheumatic aortic (valve) stenosis: Secondary | ICD-10-CM

## 2021-10-14 NOTE — Telephone Encounter (Signed)
FYI

## 2021-10-14 NOTE — Telephone Encounter (Signed)
See note

## 2021-10-14 NOTE — Telephone Encounter (Signed)
Thomas [name] with [facility] stated that Patient is having his ECHO on 10/15/21 at 11:00 am at [facility] in [place]. Surgery is scheduled for 10/16/21.

## 2021-10-15 ENCOUNTER — Ambulatory Visit: Payer: Medicare Other

## 2021-10-15 ENCOUNTER — Encounter (HOSPITAL_COMMUNITY): Payer: Self-pay | Admitting: Neurosurgery

## 2021-10-15 ENCOUNTER — Other Ambulatory Visit: Payer: Self-pay

## 2021-10-15 ENCOUNTER — Telehealth: Payer: Self-pay

## 2021-10-15 DIAGNOSIS — I350 Nonrheumatic aortic (valve) stenosis: Secondary | ICD-10-CM

## 2021-10-15 LAB — ECHOCARDIOGRAM COMPLETE
AR max vel: 0.83 cm2
AV Area VTI: 0.82 cm2
AV Area mean vel: 0.89 cm2
AV Mean grad: 39.3 mmHg
AV Peak grad: 68.6 mmHg
Ao pk vel: 4.14 m/s
Area-P 1/2: 3.46 cm2
Calc EF: 59.5 %
P 1/2 time: 677 msec
S' Lateral: 2.78 cm
Single Plane A2C EF: 60.9 %
Single Plane A4C EF: 59.9 %

## 2021-10-15 NOTE — Telephone Encounter (Signed)
Patient was advised of recommendations and surgery not taking place in the morning. Advised referral was being placed for Cardiology and Dr Wynetta Emery surgery scheduler in office was also notified.

## 2021-10-15 NOTE — Telephone Encounter (Signed)
Pt had updated ECHo that was read today and advised pt has Severe Aortic Stenosis; pt needs to referred to [facility]. Pt scheduled for surgery tomorrow morning. Any questions please contact anesthesiologist Antionette Poles at [phone]

## 2021-10-15 NOTE — Telephone Encounter (Signed)
With new diagnosis of severe aortic stenosis-I would cancel surgery for tomorrow morning until further evaluated by Dr. Jimmey Ralph or cardiologist. Please call patient to inform and office or hospital performing surgery  Can place referral as requested to heart care

## 2021-10-16 ENCOUNTER — Inpatient Hospital Stay (HOSPITAL_COMMUNITY): Admission: RE | Admit: 2021-10-16 | Payer: Medicare Other | Source: Ambulatory Visit | Admitting: Neurosurgery

## 2021-10-16 ENCOUNTER — Encounter (HOSPITAL_COMMUNITY): Admission: RE | Payer: Self-pay | Source: Ambulatory Visit

## 2021-10-16 SURGERY — ANTERIOR CERVICAL DECOMPRESSION/DISCECTOMY FUSION 3 LEVELS
Anesthesia: General

## 2021-10-19 ENCOUNTER — Encounter: Payer: Self-pay | Admitting: Cardiovascular Disease

## 2021-10-19 NOTE — Progress Notes (Unsigned)
Cardiology Office Note:    Date:  10/20/2021   ID:  Thomas [name], DOB [birth year], MRN [id number]  PCP:  Thomas Dark, MD   [facility] Providers Cardiologist:  Thomas [name]   Referring MD: Thomas Dark, MD   Chief Complaint  Patient presents with   Hypertension        Aortic Stenosis     History of Present Illness:    Thomas [name] is a 66 y.o. male with a hx of DM, HLD, HTN, stroke. He was recently scheduled to have some neck  surgery with Dr. Wynetta [name].  He had an echocardiogram prior to his scheduled back surgery and he was found to have severe aortic stenosis.  Has occasional arm numbness / weakness.   Scheduled him for a working visit to discuss his aortic stenosis. His daughter is Thomas [name] ( our Engineer, site )   Seen with Thomas, Thomas [name].  Has been told he has a murmur years ago.  Has not had any cardiac issues related to his AS No cp, dyspnea, syncope, no presyncope  Is not limited from a cardiology standpoint Able to do yard work without any CP or dyspnea   Walks with a cane because of his R leg nerve / back issues.   Occasional palpitations that last for 1-2 seconds .      Avoids salt, Eats a fairly healthy diet Worked in [employer], and [employer] )  Drove a dump truck,  has been a Curator   I have discussed the case with Dr. Wynetta [name]. Thomas [name] is myelopathic - has ongoing  compression of his spinal cord.  The case would be about 2.5 hours. Anticipated blood loss would be minimal .   Past Medical History:  Diagnosis Date   Diabetes mellitus without complication (HCC)    Hypercholesterolemia    Hypertension    Stroke (HCC) 1982   pt states he had a mini stroke in 1982    Past Surgical History:  Procedure Laterality Date   BACK SURGERY     HERNIA REPAIR      Current Medications: Current Meds  Medication Sig   Apple Cider Vinegar 500 MG TABS Take 500 mg by mouth at bedtime.   diclofenac sodium  (VOLTAREN) 1 % GEL Apply 1 Application topically 4 (four) times daily as needed (pain).   FARXIGA 10 MG TABS tablet TAKE 1 TABLET BY MOUTH EVERY DAY   gabapentin (NEURONTIN) 400 MG capsule Take 400 mg by mouth 3 (three) times daily.   glimepiride (AMARYL) 4 MG tablet Take 1 tablet (4 mg total) by mouth daily with breakfast.   HYDROmorphone (DILAUDID) 2 MG tablet Take 2 mg by mouth 4 (four) times daily as needed for severe pain.   levothyroxine (SYNTHROID) 150 MCG tablet TAKE 1 TABLET BY MOUTH DAILY BEFORE BREAKFAST.   lisinopril (ZESTRIL) 10 MG tablet TAKE 1 TABLET BY MOUTH TWICE A DAY AS DIRECTED   metFORMIN (GLUCOPHAGE) 1000 MG tablet TAKE 1 TABLET (1,000 MG TOTAL) BY MOUTH TWICE A DAY WITH FOOD   nortriptyline (PAMELOR) 50 MG capsule TAKE 3 CAPSULES BY MOUTH EVERY DAY AT BEDTIME   simvastatin (ZOCOR) 40 MG tablet TAKE 1 TABLET BY MOUTH ONCE DAILY   Turmeric (QC TUMERIC COMPLEX) 500 MG CAPS Take 500 mg by mouth at bedtime.   [DISCONTINUED] potassium chloride (KLOR-CON) 10 MEQ tablet TAKE 1 TABLET TWICE A DAY     Allergies:   Trulicity [dulaglutide], Januvia [  sitagliptin], Other, and Penicillins   Social History   Socioeconomic History   Marital status: Married    Spouse name: Not on file   Number of children: Not on file   Years of education: Not on file   Highest education level: Not on file  Occupational History   Not on file  Tobacco Use   Smoking status: Never   Smokeless tobacco: Never  Vaping Use   Vaping Use: Never used  Substance and Sexual Activity   Alcohol use: Never    Comment: occ beer    Drug use: No   Sexual activity: Not on file  Other Topics Concern   Not on file  Social History Narrative   Not on file   Social Determinants of Health   Financial Resource Strain: Not on file  Food Insecurity: Not on file  Transportation Needs: Not on file  Physical Activity: Not on file  Stress: Not on file  Social Connections: Not on file     Family History: The  patient's family history includes Arthritis in his father and mother; Breast cancer in his sister; Diabetes in his mother; Heart attack in his mother; Hypertension in his mother; Skin cancer in his father.  ROS:   Please see the history of present illness.     All other systems reviewed and are negative.  EKGs/Labs/Other Studies Reviewed:    The following studies were reviewed today:   EKG: 717, 23: Normal sinus rhythm at 83.  No ST or T wave changes.  Voltage criteria for LVH  Recent Labs: 10/06/2021: ALT 24; BUN 41; Creatinine, Ser 1.66; Hemoglobin 16.4; Platelets 200.0; Potassium 5.0; Sodium 134; TSH 1.67  Recent Lipid Panel    Component Value Date/Time   CHOL 123 10/06/2021 0826   TRIG 158.0 (H) 10/06/2021 0826   HDL 33.00 (L) 10/06/2021 0826   CHOLHDL 4 10/06/2021 0826   VLDL 31.6 10/06/2021 0826   LDLCALC 59 10/06/2021 0826   LDLDIRECT 76.0 10/09/2020 0840     Risk Assessment/Calculations:           Physical Exam:    VS:  BP 122/68   Pulse 69   Ht 6' (1.829 m)   Wt 217 lb 3.2 oz (98.5 kg)   SpO2 99%   BMI 29.46 kg/m     Wt Readings from Last 3 Encounters:  10/20/21 217 lb 3.2 oz (98.5 kg)  10/12/21 212 lb 1.6 oz (96.2 kg)  10/06/21 211 lb 12.8 oz (96.1 kg)     GEN:  Well nourished, well developed in no acute distress HEENT: Normal NECK: No JVD; No carotid bruits LYMPHATICS: No lymphadenopathy CARDIAC:  RR , 3/6 systolic murmur ,  radial pulses are mildly blunted.  RESPIRATORY:  Clear to auscultation without rales, wheezing or rhonchi  ABDOMEN: Soft, non-tender, non-distended MUSCULOSKELETAL:  No edema; No deformity  SKIN: Warm and dry NEUROLOGIC:  Alert and oriented x 3 PSYCHIATRIC:  Normal affect   ASSESSMENT:    1. Hypertension associated with diabetes (HCC)   2. Nonrheumatic aortic valve stenosis   3. Heart murmur   4. Hyperlipidemia associated with type 2 diabetes mellitus (HCC)    PLAN:      Aortic stenosis: Moderate to severe.  I  personally reviewed the echocardiogram.  His valve still opens fairly well but it clearly is calcified and stenotic.    He is not having any angina.  Does not have any limitations in terms of chest pain or shortness of breath.  He can climb several flights of stairs without any chest pain or shortness of breath.  His main limitation is due to back pain and is neck issues.     Given the fact that he has active compression of his spinal cord, our best option is to proceed with neck surgery to relieve his compression symptoms. I have called Dorinda Hill and he will call Dr. Lonie Peak office to reschedule.  He is at low-moderate risk for this surgery . We will be happy to follow along post operatively.   I will see him in several months for follow up visit. Our rounding team will be available to see him following his surgery    2.  HLD :  lipids are well controlled.   3.  HTN:   well controlled.            Medication Adjustments/Labs and Tests Ordered: Current medicines are reviewed at length with the patient today.  Concerns regarding medicines are outlined above.  No orders of the defined types were placed in this encounter.  No orders of the defined types were placed in this encounter.    Patient Instructions  Medication Instructions:  Stop taking Potassium (Kdur) *If you need a refill on your cardiac medications before your next appointment, please call your pharmacy*   Follow-Up: At [facility], you and your health needs are our priority.  As part of our continuing mission to provide you with exceptional heart care, we have created designated Provider Care Teams.  These Care Teams include your primary Cardiologist (physician) and Advanced Practice Providers (APPs -  Physician Assistants and Nurse Practitioners) who all work together to provide you with the care you need, when you need it.  We recommend signing up for the patient portal called "MyChart".  Sign up information is  provided on this After Visit Summary.  MyChart is used to connect with patients for Virtual Visits (Telemedicine).  Patients are able to view lab/test results, encounter notes, upcoming appointments, etc.  Non-urgent messages can be sent to your provider as well.   To learn more about what you can do with MyChart, go to [email or web address].    Your next appointment:   3 month(s)  The format for your next appointment:   In Person  Provider: Dr Elease Hashimoto              Signed, Kristeen Miss, MD  10/20/2021 1:11 PM    [facility]

## 2021-10-20 ENCOUNTER — Other Ambulatory Visit: Payer: Self-pay | Admitting: Neurosurgery

## 2021-10-20 ENCOUNTER — Encounter: Payer: Self-pay | Admitting: Cardiovascular Disease

## 2021-10-20 ENCOUNTER — Ambulatory Visit: Payer: Medicare Other | Admitting: Cardiovascular Disease

## 2021-10-20 VITALS — BP 122/68 | HR 69 | Ht 72.0 in | Wt 217.2 lb

## 2021-10-20 DIAGNOSIS — E1169 Type 2 diabetes mellitus with other specified complication: Secondary | ICD-10-CM

## 2021-10-20 DIAGNOSIS — I350 Nonrheumatic aortic (valve) stenosis: Secondary | ICD-10-CM | POA: Diagnosis not present

## 2021-10-20 DIAGNOSIS — E1159 Type 2 diabetes mellitus with other circulatory complications: Secondary | ICD-10-CM

## 2021-10-20 DIAGNOSIS — R011 Cardiac murmur, unspecified: Secondary | ICD-10-CM | POA: Diagnosis not present

## 2021-10-20 DIAGNOSIS — I152 Hypertension secondary to endocrine disorders: Secondary | ICD-10-CM

## 2021-10-20 DIAGNOSIS — E785 Hyperlipidemia, unspecified: Secondary | ICD-10-CM | POA: Diagnosis not present

## 2021-10-20 NOTE — Patient Instructions (Signed)
Medication Instructions:  Stop taking Potassium (Kdur) *If you need a refill on your cardiac medications before your next appointment, please call your pharmacy*   Follow-Up: At [facility], you and your health needs are our priority.  As part of our continuing mission to provide you with exceptional heart care, we have created designated Provider Care Teams.  These Care Teams include your primary Cardiologist (physician) and Advanced Practice Providers (APPs -  Physician Assistants and Nurse Practitioners) who all work together to provide you with the care you need, when you need it.  We recommend signing up for the patient portal called "MyChart".  Sign up information is provided on this After Visit Summary.  MyChart is used to connect with patients for Virtual Visits (Telemedicine).  Patients are able to view lab/test results, encounter notes, upcoming appointments, etc.  Non-urgent messages can be sent to your provider as well.   To learn more about what you can do with MyChart, go to [email or web address].    Your next appointment:   3 month(s)  The format for your next appointment:   In Person  Provider: Dr Elease Hashimoto   Other Instructions See Dr Clifton James 10/21/21 at 920

## 2021-10-21 ENCOUNTER — Institutional Professional Consult (permissible substitution): Payer: Medicare Other | Admitting: Cardiovascular Disease

## 2021-10-26 ENCOUNTER — Other Ambulatory Visit: Payer: Self-pay | Admitting: Family Medicine

## 2021-10-28 ENCOUNTER — Inpatient Hospital Stay (HOSPITAL_COMMUNITY)
Admission: RE | Admit: 2021-10-28 | Discharge: 2021-10-29 | DRG: 473 | Disposition: A | Discharge status: Home / Self Care | Payer: Medicare Other | Source: Ambulatory Visit | Attending: Neurosurgery | Admitting: Neurosurgery

## 2021-10-28 ENCOUNTER — Encounter (HOSPITAL_COMMUNITY)
Admission: RE | Disposition: A | Discharge status: Home / Self Care | Payer: Self-pay | Source: Ambulatory Visit | Attending: Neurosurgery

## 2021-10-28 ENCOUNTER — Encounter (HOSPITAL_COMMUNITY): Payer: Self-pay | Admitting: Neurosurgery

## 2021-10-28 ENCOUNTER — Inpatient Hospital Stay (HOSPITAL_COMMUNITY): Payer: Medicare Other | Admitting: Physician Assistant

## 2021-10-28 ENCOUNTER — Inpatient Hospital Stay (HOSPITAL_COMMUNITY): Payer: Medicare Other

## 2021-10-28 DIAGNOSIS — Z79890 Hormone replacement therapy (postmenopausal): Secondary | ICD-10-CM | POA: Diagnosis not present

## 2021-10-28 DIAGNOSIS — Z79899 Other long term (current) drug therapy: Secondary | ICD-10-CM

## 2021-10-28 DIAGNOSIS — Z880 Allergy status to penicillin: Secondary | ICD-10-CM | POA: Diagnosis not present

## 2021-10-28 DIAGNOSIS — Z888 Allergy status to other drugs, medicaments and biological substances status: Secondary | ICD-10-CM

## 2021-10-28 DIAGNOSIS — E7800 Pure hypercholesterolemia, unspecified: Secondary | ICD-10-CM | POA: Diagnosis not present

## 2021-10-28 DIAGNOSIS — Z981 Arthrodesis status: Secondary | ICD-10-CM | POA: Diagnosis not present

## 2021-10-28 DIAGNOSIS — Z8249 Family history of ischemic heart disease and other diseases of the circulatory system: Secondary | ICD-10-CM | POA: Diagnosis not present

## 2021-10-28 DIAGNOSIS — Z8673 Personal history of transient ischemic attack (TIA), and cerebral infarction without residual deficits: Secondary | ICD-10-CM

## 2021-10-28 DIAGNOSIS — N183 Chronic kidney disease, stage 3 unspecified: Secondary | ICD-10-CM | POA: Diagnosis not present

## 2021-10-28 DIAGNOSIS — E119 Type 2 diabetes mellitus without complications: Secondary | ICD-10-CM

## 2021-10-28 DIAGNOSIS — M4322 Fusion of spine, cervical region: Secondary | ICD-10-CM | POA: Diagnosis not present

## 2021-10-28 DIAGNOSIS — Z833 Family history of diabetes mellitus: Secondary | ICD-10-CM

## 2021-10-28 DIAGNOSIS — M4712 Other spondylosis with myelopathy, cervical region: Principal | ICD-10-CM | POA: Diagnosis present

## 2021-10-28 DIAGNOSIS — Z8261 Family history of arthritis: Secondary | ICD-10-CM | POA: Diagnosis not present

## 2021-10-28 DIAGNOSIS — I129 Hypertensive chronic kidney disease with stage 1 through stage 4 chronic kidney disease, or unspecified chronic kidney disease: Secondary | ICD-10-CM | POA: Diagnosis not present

## 2021-10-28 DIAGNOSIS — Z7984 Long term (current) use of oral hypoglycemic drugs: Secondary | ICD-10-CM

## 2021-10-28 DIAGNOSIS — I10 Essential (primary) hypertension: Secondary | ICD-10-CM

## 2021-10-28 DIAGNOSIS — Z803 Family history of malignant neoplasm of breast: Secondary | ICD-10-CM | POA: Diagnosis not present

## 2021-10-28 DIAGNOSIS — M4802 Spinal stenosis, cervical region: Secondary | ICD-10-CM | POA: Diagnosis not present

## 2021-10-28 DIAGNOSIS — E1122 Type 2 diabetes mellitus with diabetic chronic kidney disease: Secondary | ICD-10-CM | POA: Diagnosis not present

## 2021-10-28 DIAGNOSIS — G992 Myelopathy in diseases classified elsewhere: Principal | ICD-10-CM

## 2021-10-28 DIAGNOSIS — Z808 Family history of malignant neoplasm of other organs or systems: Secondary | ICD-10-CM

## 2021-10-28 DIAGNOSIS — G9529 Other cord compression: Secondary | ICD-10-CM | POA: Diagnosis not present

## 2021-10-28 HISTORY — PX: ANTERIOR CERVICAL DECOMP/DISCECTOMY FUSION: SHX1161

## 2021-10-28 LAB — GLUCOSE, CAPILLARY
Glucose-Capillary: 118 mg/dL — ABNORMAL HIGH (ref 70–99)
Glucose-Capillary: 222 mg/dL — ABNORMAL HIGH (ref 70–99)
Glucose-Capillary: 243 mg/dL — ABNORMAL HIGH (ref 70–99)
Glucose-Capillary: 227 mg/dL — ABNORMAL HIGH (ref 70–99)

## 2021-10-28 LAB — BASIC METABOLIC PANEL
Anion gap: 10 (ref 5–15)
BUN: 19 mg/dL (ref 8–23)
CO2: 24 mmol/L (ref 22–32)
Calcium: 8.7 mg/dL — ABNORMAL LOW (ref 8.9–10.3)
Chloride: 104 mmol/L (ref 98–111)
Creatinine, Ser: 1.54 mg/dL — ABNORMAL HIGH (ref 0.61–1.24)
GFR, Estimated: 49 mL/min — ABNORMAL LOW (ref >60)
Glucose, Bld: 230 mg/dL — ABNORMAL HIGH (ref 70–99)
Potassium: 4.4 mmol/L (ref 3.5–5.1)
Sodium: 138 mmol/L (ref 135–145)

## 2021-10-28 SURGERY — ANTERIOR CERVICAL DECOMPRESSION/DISCECTOMY FUSION 3 LEVELS
Anesthesia: General | Site: Spine Cervical

## 2021-10-28 MED ORDER — FENTANYL CITRATE (PF) 250 MCG/5ML IJ SOLN
INTRAMUSCULAR | Status: DC | PRN
  Administered 2021-10-28: 50 ug via INTRAVENOUS
  Administered 2021-10-28: 100 ug via INTRAVENOUS

## 2021-10-28 MED ORDER — ONDANSETRON HCL 4 MG PO TABS: 4.0000 mg | ORAL_TABLET | Freq: Four times a day (QID) | ORAL | Status: DC | PRN

## 2021-10-28 MED ORDER — ONDANSETRON HCL 4 MG/2ML IJ SOLN
INTRAMUSCULAR | Status: AC
  Filled 2021-10-28: qty 2

## 2021-10-28 MED ORDER — INSULIN ASPART 100 UNIT/ML IJ SOLN: 0.0000 [IU] | Freq: Three times a day (TID) | INTRAMUSCULAR | Status: DC

## 2021-10-28 MED ORDER — CHLORHEXIDINE GLUCONATE 0.12 % MT SOLN
15.0000 mL | Freq: Once | OROMUCOSAL | Status: AC
  Administered 2021-10-28: 15 mL via OROMUCOSAL
  Filled 2021-10-28: qty 15

## 2021-10-28 MED ORDER — DAPAGLIFLOZIN PROPANEDIOL 10 MG PO TABS
10.0000 mg | ORAL_TABLET | Freq: Every day | ORAL | Status: DC
  Filled 2021-10-28: qty 1

## 2021-10-28 MED ORDER — MEPERIDINE HCL 25 MG/ML IJ SOLN: 6.2500 mg | INTRAMUSCULAR | Status: DC | PRN

## 2021-10-28 MED ORDER — ALBUMIN HUMAN 5 % IV SOLN: INTRAVENOUS | Status: DC | PRN

## 2021-10-28 MED ORDER — ONDANSETRON HCL 4 MG/2ML IJ SOLN
INTRAMUSCULAR | Status: DC | PRN
  Administered 2021-10-28: 4 mg via INTRAVENOUS

## 2021-10-28 MED ORDER — ACETAMINOPHEN 10 MG/ML IV SOLN
INTRAVENOUS | Status: AC
  Filled 2021-10-28: qty 100

## 2021-10-28 MED ORDER — INSULIN ASPART 100 UNIT/ML IJ SOLN
0.0000 [IU] | Freq: Every day | INTRAMUSCULAR | Status: DC
  Administered 2021-10-28: 2 [IU] via SUBCUTANEOUS

## 2021-10-28 MED ORDER — SUGAMMADEX SODIUM 200 MG/2ML IV SOLN
INTRAVENOUS | Status: DC | PRN
  Administered 2021-10-28: 200 mg via INTRAVENOUS

## 2021-10-28 MED ORDER — PANTOPRAZOLE SODIUM 40 MG IV SOLR: 40.0000 mg | Freq: Every day | INTRAVENOUS | Status: DC

## 2021-10-28 MED ORDER — SIMVASTATIN 20 MG PO TABS
40.0000 mg | ORAL_TABLET | Freq: Every day | ORAL | Status: DC
  Administered 2021-10-28: 40 mg via ORAL
  Filled 2021-10-28: qty 2

## 2021-10-28 MED ORDER — SODIUM CHLORIDE 0.9% FLUSH: 3.0000 mL | INTRAVENOUS | Status: DC | PRN

## 2021-10-28 MED ORDER — HYDROMORPHONE HCL 1 MG/ML IJ SOLN
INTRAMUSCULAR | Status: AC
  Filled 2021-10-28: qty 1

## 2021-10-28 MED ORDER — VANCOMYCIN HCL 750 MG/150ML IV SOLN
750.0000 mg | Freq: Two times a day (BID) | INTRAVENOUS | Status: DC
  Administered 2021-10-28: 750 mg via INTRAVENOUS
  Filled 2021-10-28: qty 150

## 2021-10-28 MED ORDER — ROCURONIUM BROMIDE 10 MG/ML (PF) SYRINGE
PREFILLED_SYRINGE | INTRAVENOUS | Status: DC | PRN
  Administered 2021-10-28: 10 mg via INTRAVENOUS
  Administered 2021-10-28: 60 mg via INTRAVENOUS
  Administered 2021-10-28 (×3): 20 mg via INTRAVENOUS

## 2021-10-28 MED ORDER — DEXAMETHASONE SODIUM PHOSPHATE 10 MG/ML IJ SOLN
INTRAMUSCULAR | Status: AC
  Filled 2021-10-28: qty 1

## 2021-10-28 MED ORDER — GABAPENTIN 400 MG PO CAPS
400.0000 mg | ORAL_CAPSULE | Freq: Three times a day (TID) | ORAL | Status: DC
  Administered 2021-10-28 (×2): 400 mg via ORAL
  Filled 2021-10-28 (×2): qty 1

## 2021-10-28 MED ORDER — ORAL CARE MOUTH RINSE: 15.0000 mL | Freq: Once | OROMUCOSAL | Status: AC

## 2021-10-28 MED ORDER — LIDOCAINE 2% (20 MG/ML) 5 ML SYRINGE
INTRAMUSCULAR | Status: AC
Start: 2021-10-28 — End: ?
  Filled 2021-10-28: qty 5

## 2021-10-28 MED ORDER — DEXAMETHASONE SODIUM PHOSPHATE 10 MG/ML IJ SOLN
INTRAMUSCULAR | Status: DC | PRN
  Administered 2021-10-28: 10 mg via INTRAVENOUS

## 2021-10-28 MED ORDER — METFORMIN HCL 500 MG PO TABS
1000.0000 mg | ORAL_TABLET | Freq: Every day | ORAL | Status: DC
Start: 2021-10-29 — End: 2021-10-29
  Administered 2021-10-29: 1000 mg via ORAL
  Filled 2021-10-28: qty 2

## 2021-10-28 MED ORDER — KETAMINE HCL 50 MG/5ML IJ SOSY
PREFILLED_SYRINGE | INTRAMUSCULAR | Status: AC
  Filled 2021-10-28: qty 5

## 2021-10-28 MED ORDER — THROMBIN 5000 UNITS EX SOLR
CUTANEOUS | Status: AC
  Filled 2021-10-28: qty 5000

## 2021-10-28 MED ORDER — THROMBIN 20000 UNITS EX SOLR
CUTANEOUS | Status: AC
  Filled 2021-10-28: qty 20000

## 2021-10-28 MED ORDER — ACETAMINOPHEN 325 MG PO TABS: 650.0000 mg | ORAL_TABLET | ORAL | Status: DC | PRN

## 2021-10-28 MED ORDER — INSULIN ASPART 100 UNIT/ML IJ SOLN: 0.0000 [IU] | INTRAMUSCULAR | Status: DC | PRN

## 2021-10-28 MED ORDER — THROMBIN 5000 UNITS EX SOLR
OROMUCOSAL | Status: DC | PRN
  Administered 2021-10-28: 5 mL via TOPICAL

## 2021-10-28 MED ORDER — SODIUM CHLORIDE 0.9 % IV SOLN
250.0000 mL | INTRAVENOUS | Status: DC
  Administered 2021-10-28: 250 mL via INTRAVENOUS

## 2021-10-28 MED ORDER — SODIUM CHLORIDE 0.9% FLUSH
3.0000 mL | Freq: Two times a day (BID) | INTRAVENOUS | Status: DC
Start: 2021-10-28 — End: 2021-10-29

## 2021-10-28 MED ORDER — 0.9 % SODIUM CHLORIDE (POUR BTL) OPTIME
TOPICAL | Status: DC | PRN
  Administered 2021-10-28: 1000 mL

## 2021-10-28 MED ORDER — PHENOL 1.4 % MT LIQD: 1.0000 | OROMUCOSAL | Status: DC | PRN

## 2021-10-28 MED ORDER — GLIMEPIRIDE 4 MG PO TABS
4.0000 mg | ORAL_TABLET | Freq: Every day | ORAL | Status: DC
  Administered 2021-10-29: 4 mg via ORAL
  Filled 2021-10-28: qty 1

## 2021-10-28 MED ORDER — TURMERIC 500 MG PO CAPS: 500.0000 mg | ORAL_CAPSULE | Freq: Every day | ORAL | Status: DC

## 2021-10-28 MED ORDER — PHENYLEPHRINE HCL-NACL 20-0.9 MG/250ML-% IV SOLN
INTRAVENOUS | Status: DC | PRN
  Administered 2021-10-28: 30 ug/min via INTRAVENOUS

## 2021-10-28 MED ORDER — EPHEDRINE SULFATE-NACL 50-0.9 MG/10ML-% IV SOSY
PREFILLED_SYRINGE | INTRAVENOUS | Status: DC | PRN
  Administered 2021-10-28 (×2): 5 mg via INTRAVENOUS

## 2021-10-28 MED ORDER — CYCLOBENZAPRINE HCL 10 MG PO TABS
10.0000 mg | ORAL_TABLET | Freq: Three times a day (TID) | ORAL | Status: DC | PRN
  Administered 2021-10-28: 10 mg via ORAL
  Filled 2021-10-28: qty 1

## 2021-10-28 MED ORDER — PROPOFOL 10 MG/ML IV BOLUS
INTRAVENOUS | Status: DC | PRN
  Administered 2021-10-28: 100 mg via INTRAVENOUS

## 2021-10-28 MED ORDER — VANCOMYCIN HCL 1000 MG IV SOLR
1000.0000 mg | INTRAVENOUS | Status: AC
  Administered 2021-10-28: 1000 mg via INTRAVENOUS
  Filled 2021-10-28: qty 20

## 2021-10-28 MED ORDER — APPLE CIDER VINEGAR 500 MG PO TABS: 500.0000 mg | ORAL_TABLET | Freq: Every day | ORAL | Status: DC

## 2021-10-28 MED ORDER — LACTATED RINGERS IV SOLN: INTRAVENOUS | Status: DC

## 2021-10-28 MED ORDER — HYDROMORPHONE HCL 1 MG/ML IJ SOLN
0.2500 mg | INTRAMUSCULAR | Status: DC | PRN
  Administered 2021-10-28: 0.5 mg via INTRAVENOUS

## 2021-10-28 MED ORDER — KETOROLAC TROMETHAMINE 15 MG/ML IJ SOLN
15.0000 mg | Freq: Once | INTRAMUSCULAR | Status: AC
  Administered 2021-10-28: 15 mg via INTRAVENOUS

## 2021-10-28 MED ORDER — ACETAMINOPHEN 650 MG RE SUPP: 650.0000 mg | RECTAL | Status: DC | PRN

## 2021-10-28 MED ORDER — ONDANSETRON HCL 4 MG/2ML IJ SOLN: 4.0000 mg | Freq: Once | INTRAMUSCULAR | Status: DC | PRN

## 2021-10-28 MED ORDER — ROCURONIUM BROMIDE 10 MG/ML (PF) SYRINGE
PREFILLED_SYRINGE | INTRAVENOUS | Status: AC
  Filled 2021-10-28: qty 10

## 2021-10-28 MED ORDER — HYDROMORPHONE HCL 1 MG/ML IJ SOLN: 0.5000 mg | INTRAMUSCULAR | Status: DC | PRN

## 2021-10-28 MED ORDER — LEVOTHYROXINE SODIUM 150 MCG PO TABS
150.0000 ug | ORAL_TABLET | Freq: Every day | ORAL | Status: DC
Start: 2021-10-29 — End: 2021-10-29
  Administered 2021-10-29: 150 ug via ORAL
  Filled 2021-10-28: qty 1

## 2021-10-28 MED ORDER — HYDROMORPHONE HCL 2 MG PO TABS
2.0000 mg | ORAL_TABLET | Freq: Four times a day (QID) | ORAL | Status: DC | PRN
  Administered 2021-10-28 – 2021-10-29 (×2): 2 mg via ORAL
  Filled 2021-10-28 (×2): qty 1

## 2021-10-28 MED ORDER — LISINOPRIL 10 MG PO TABS: 10.0000 mg | ORAL_TABLET | Freq: Every day | ORAL | Status: DC

## 2021-10-28 MED ORDER — HYDROCODONE-ACETAMINOPHEN 5-325 MG PO TABS: 2.0000 | ORAL_TABLET | ORAL | Status: DC | PRN

## 2021-10-28 MED ORDER — MENTHOL 3 MG MT LOZG: 1.0000 | LOZENGE | OROMUCOSAL | Status: DC | PRN

## 2021-10-28 MED ORDER — LIDOCAINE 2% (20 MG/ML) 5 ML SYRINGE
INTRAMUSCULAR | Status: DC | PRN
  Administered 2021-10-28: 100 mg via INTRAVENOUS

## 2021-10-28 MED ORDER — THROMBIN 20000 UNITS EX SOLR
CUTANEOUS | Status: DC | PRN
  Administered 2021-10-28: 20 mL via TOPICAL

## 2021-10-28 MED ORDER — NORTRIPTYLINE HCL 25 MG PO CAPS
50.0000 mg | ORAL_CAPSULE | Freq: Every day | ORAL | Status: DC
  Administered 2021-10-28: 50 mg via ORAL
  Filled 2021-10-28: qty 2

## 2021-10-28 MED ORDER — KETAMINE HCL 10 MG/ML IJ SOLN
INTRAMUSCULAR | Status: DC | PRN
  Administered 2021-10-28: 10 mg via INTRAVENOUS
  Administered 2021-10-28: 20 mg via INTRAVENOUS
  Administered 2021-10-28 (×2): 10 mg via INTRAVENOUS

## 2021-10-28 MED ORDER — FENTANYL CITRATE (PF) 250 MCG/5ML IJ SOLN
INTRAMUSCULAR | Status: AC
  Filled 2021-10-28: qty 5

## 2021-10-28 MED ORDER — ONDANSETRON HCL 4 MG/2ML IJ SOLN: 4.0000 mg | Freq: Four times a day (QID) | INTRAMUSCULAR | Status: DC | PRN

## 2021-10-28 MED ORDER — ALUM & MAG HYDROXIDE-SIMETH 200-200-20 MG/5ML PO SUSP: 30.0000 mL | Freq: Four times a day (QID) | ORAL | Status: DC | PRN

## 2021-10-28 MED ORDER — ACETAMINOPHEN 10 MG/ML IV SOLN
INTRAVENOUS | Status: DC | PRN
  Administered 2021-10-28: 1000 mg via INTRAVENOUS

## 2021-10-28 MED ORDER — PANTOPRAZOLE SODIUM 40 MG PO TBEC
40.0000 mg | DELAYED_RELEASE_TABLET | Freq: Every day | ORAL | Status: DC
  Administered 2021-10-28: 40 mg via ORAL
  Filled 2021-10-28: qty 1

## 2021-10-28 MED ORDER — KETOROLAC TROMETHAMINE 15 MG/ML IJ SOLN
INTRAMUSCULAR | Status: AC
  Filled 2021-10-28: qty 1

## 2021-10-28 SURGICAL SUPPLY — 65 items
BAG COUNTER SPONGE SURGICOUNT (BAG) ×2 IMPLANT
BAND RUBBER #18 3X1/16 STRL (MISCELLANEOUS) ×4 IMPLANT
BASKET BONE COLLECTION (BASKET) ×2 IMPLANT
BENZOIN TINCTURE PRP APPL 2/3 (GAUZE/BANDAGES/DRESSINGS) ×2 IMPLANT
BIT DRILL NEURO 2X3.1 SFT TUCH (MISCELLANEOUS) ×1 IMPLANT
BONE VIVIGEN FORMABLE 1.3CC (Bone Implant) ×4 IMPLANT
BUR MATCHSTICK NEURO 3.0 LAGG (BURR) ×2 IMPLANT
CANISTER SUCT 3000ML PPV (MISCELLANEOUS) ×2 IMPLANT
CARTRIDGE OIL MAESTRO DRILL (MISCELLANEOUS) ×1 IMPLANT
DERMABOND ADVANCED (GAUZE/BANDAGES/DRESSINGS) ×1
DERMABOND ADVANCED .7 DNX12 (GAUZE/BANDAGES/DRESSINGS) ×1 IMPLANT
DIFFUSER DRILL AIR PNEUMATIC (MISCELLANEOUS) ×2 IMPLANT
DRAIN JACKSON PRT FLT 7MM (DRAIN) ×1 IMPLANT
DRAPE C-ARM 42X72 X-RAY (DRAPES) ×4 IMPLANT
DRAPE LAPAROTOMY 100X72 PEDS (DRAPES) ×2 IMPLANT
DRAPE MICROSCOPE LEICA (MISCELLANEOUS) ×2 IMPLANT
DRILL NEURO 2X3.1 SOFT TOUCH (MISCELLANEOUS) ×2
DRSG OPSITE POSTOP 4X6 (GAUZE/BANDAGES/DRESSINGS) ×1 IMPLANT
DURAPREP 6ML APPLICATOR 50/CS (WOUND CARE) ×2 IMPLANT
ELECT COATED BLADE 2.86 ST (ELECTRODE) ×2 IMPLANT
ELECT REM PT RETURN 9FT ADLT (ELECTROSURGICAL) ×2
ELECTRODE REM PT RTRN 9FT ADLT (ELECTROSURGICAL) ×1 IMPLANT
EVACUATOR SILICONE 100CC (DRAIN) ×1 IMPLANT
GAUZE 4X4 16PLY ~~LOC~~+RFID DBL (SPONGE) IMPLANT
GAUZE SPONGE 4X4 12PLY STRL (GAUZE/BANDAGES/DRESSINGS) ×2 IMPLANT
GLOVE BIO SURGEON STRL SZ7 (GLOVE) IMPLANT
GLOVE BIO SURGEON STRL SZ8 (GLOVE) ×3 IMPLANT
GLOVE BIOGEL PI IND STRL 7.0 (GLOVE) IMPLANT
GLOVE BIOGEL PI INDICATOR 7.0 (GLOVE)
GLOVE EXAM NITRILE XL STR (GLOVE) IMPLANT
GLOVE INDICATOR 8.5 STRL (GLOVE) ×4 IMPLANT
GOWN STRL REUS W/ TWL LRG LVL3 (GOWN DISPOSABLE) IMPLANT
GOWN STRL REUS W/ TWL XL LVL3 (GOWN DISPOSABLE) ×1 IMPLANT
GOWN STRL REUS W/TWL 2XL LVL3 (GOWN DISPOSABLE) IMPLANT
GOWN STRL REUS W/TWL LRG LVL3 (GOWN DISPOSABLE)
GOWN STRL REUS W/TWL XL LVL3 (GOWN DISPOSABLE) ×2
GRAFT BNE MATRIX VG FRMBL SM 1 (Bone Implant) IMPLANT
HALTER HD/CHIN CERV TRACTION D (MISCELLANEOUS) ×2 IMPLANT
HEMOSTAT POWDER KIT SURGIFOAM (HEMOSTASIS) ×2 IMPLANT
KIT BASIN OR (CUSTOM PROCEDURE TRAY) ×2 IMPLANT
KIT TURNOVER KIT B (KITS) ×2 IMPLANT
MARKER SKIN DUAL TIP RULER LAB (MISCELLANEOUS) ×1 IMPLANT
NDL SPNL 20GX3.5 QUINCKE YW (NEEDLE) ×1 IMPLANT
NEEDLE SPNL 20GX3.5 QUINCKE YW (NEEDLE) ×2 IMPLANT
NS IRRIG 1000ML POUR BTL (IV SOLUTION) ×2 IMPLANT
OIL CARTRIDGE MAESTRO DRILL (MISCELLANEOUS) ×2
PACK LAMINECTOMY NEURO (CUSTOM PROCEDURE TRAY) ×2 IMPLANT
PIN DISTRACTION 14MM (PIN) IMPLANT
PLATE 3 57.5XLCK NS SPNE CVD (Plate) IMPLANT
PLATE 3 ATLANTIS TRANS (Plate) ×2 IMPLANT
SCREW 4.0X13 (Screw) ×2 IMPLANT
SCREW BN 13X4XSLF DRL FXANG (Screw) IMPLANT
SCREW ST 15X4XST FXANG NS (Screw) IMPLANT
SCREW ST FIX 4 ATL (Screw) ×14 IMPLANT
SPACER HEDRON 14X16X6 0D (Spacer) ×3 IMPLANT
SPIKE FLUID TRANSFER (MISCELLANEOUS) ×2 IMPLANT
SPONGE INTESTINAL PEANUT (DISPOSABLE) ×3 IMPLANT
SPONGE SURGIFOAM ABS GEL 100 (HEMOSTASIS) ×2 IMPLANT
STRIP CLOSURE SKIN 1/2X4 (GAUZE/BANDAGES/DRESSINGS) ×2 IMPLANT
SUT VIC AB 3-0 SH 8-18 (SUTURE) ×2 IMPLANT
SUT VIC AB 4-0 PS2 27 (SUTURE) ×2 IMPLANT
TAPE CLOTH 4X10 WHT NS (GAUZE/BANDAGES/DRESSINGS) IMPLANT
TOWEL GREEN STERILE (TOWEL DISPOSABLE) ×2 IMPLANT
TOWEL GREEN STERILE FF (TOWEL DISPOSABLE) ×2 IMPLANT
WATER STERILE IRR 1000ML POUR (IV SOLUTION) ×2 IMPLANT

## 2021-10-28 NOTE — H&P (Signed)
Thomas [name] is an 66 y.o. male.   Chief Complaint: Single weakness in his arms and hands HPI: 66 year old gentleman with neck pain pain into both arms weakness in his arms and hands and difficulty walking.  Work-up revealed severe cervical spondylosis spinal cord compression at C4-5 primarily but also at C3-4 and C5-6.  Due to patient's progression of clinical syndrome imaging findings and failed conservative treatment I recommended a 3 level anterior cervical discectomy and fusion at those levels.  I extensively reviewed the risks and benefits of the operation with the patient as well as perioperative course expectations of outcome and alternatives to surgery and he understood and agreed to proceed forward.  Past Medical History:  Diagnosis Date   Diabetes mellitus without complication (HCC)    Hypercholesterolemia    Hypertension    Stroke (HCC) 1982   pt states he had a mini stroke in 1982    Past Surgical History:  Procedure Laterality Date   BACK SURGERY     HERNIA REPAIR      Family History  Problem Relation Age of Onset   Diabetes Mother    Heart attack Mother    Hypertension Mother    Arthritis Mother    Skin cancer Father    Arthritis Father    Breast cancer Sister    Social History:  reports that he has never smoked. He has never used smokeless tobacco. He reports that he does not drink alcohol and does not use drugs.  Allergies:  Allergies  Allergen Reactions   Trulicity [Dulaglutide] Hives   Januvia [Sitagliptin] Other (See Comments)    Red eyes   Other     Stomach medicine, not sure of name. Had a mini stroke   Penicillins Swelling    Arm swelling Has patient had a PCN reaction causing immediate rash, facial/tongue/throat swelling, SOB or lightheadedness with hypotension: No Has patient had a PCN reaction causing severe rash involving mucus membranes or skin necrosis: No Has patient had a PCN reaction that required hospitalization No Has patient had a PCN  reaction occurring within the last 10 years: No If all of the above answers are "NO", then may proceed with Cephalosporin use.    Medications Prior to Admission  Medication Sig Dispense Refill   Apple Cider Vinegar 500 MG TABS Take 500 mg by mouth at bedtime.     diclofenac sodium (VOLTAREN) 1 % GEL Apply 1 Application topically 4 (four) times daily as needed (pain).  4   FARXIGA 10 MG TABS tablet TAKE 1 TABLET BY MOUTH EVERY DAY 30 tablet 5   gabapentin (NEURONTIN) 400 MG capsule Take 400 mg by mouth 3 (three) times daily.     glimepiride (AMARYL) 4 MG tablet Take 1 tablet (4 mg total) by mouth daily with breakfast. 90 tablet 3   HYDROmorphone (DILAUDID) 2 MG tablet Take 2 mg by mouth 4 (four) times daily as needed for severe pain.     levothyroxine (SYNTHROID) 150 MCG tablet TAKE 1 TABLET BY MOUTH EVERY DAY BEFORE BREAKFAST 30 tablet 0   lisinopril (ZESTRIL) 10 MG tablet TAKE 1 TABLET BY MOUTH TWICE A DAY AS DIRECTED 180 tablet 1   metFORMIN (GLUCOPHAGE) 1000 MG tablet TAKE 1 TABLET (1,000 MG TOTAL) BY MOUTH TWICE A DAY WITH FOOD 180 tablet 0   nortriptyline (PAMELOR) 50 MG capsule TAKE 3 CAPSULES BY MOUTH EVERY DAY AT BEDTIME 270 capsule 1   simvastatin (ZOCOR) 40 MG tablet TAKE 1 TABLET BY MOUTH  ONCE DAILY 90 tablet 2   Turmeric (QC TUMERIC COMPLEX) 500 MG CAPS Take 500 mg by mouth at bedtime.      Results for orders placed or performed during the hospital encounter of 10/28/21 (from the past 48 hour(s))  Glucose, capillary     Status: Abnormal   Collection Time: 10/28/21  9:14 AM  Result Value Ref Range   Glucose-Capillary 118 (H) 70 - 99 mg/dL    Comment: Glucose reference range applies only to samples taken after fasting for at least 8 hours.   No results found.  Review of Systems  Musculoskeletal:  Positive for neck pain.  Neurological:  Positive for numbness.    Blood pressure (!) 156/83, pulse 86, temperature 98.3 F (36.8 C), temperature source Oral, resp. rate 18,  height 6' (1.829 m), weight 98.9 kg, SpO2 95 %. Physical Exam HENT:     Head: Normocephalic.     Nose: Nose normal.     Mouth/Throat:     Mouth: Mucous membranes are moist.  Eyes:     Pupils: Pupils are equal, round, and reactive to light.  Cardiovascular:     Rate and Rhythm: Normal rate.     Pulses: Normal pulses.  Pulmonary:     Effort: Pulmonary effort is normal.  Abdominal:     General: Abdomen is flat.  Musculoskeletal:     Cervical back: Normal range of motion.  Neurological:     Mental Status: He is alert.     Comments: Patient is awake and alert he has weakness in his right tricep at 4 out of 5 left tricep 4+ out of 5 grips appear to be strong at 5 out of 5      Assessment/Plan 66 year old gentleman with cervical spondylitic myelopathy spinal cord compression and severe cervical stenosis at C3-4 C4-5 C5-6 presents for ACDF.  Mariam Dollar, MD 10/28/2021, 10:09 AM

## 2021-10-28 NOTE — Progress Notes (Signed)
Pharmacy Antibiotic Note  Thomas [name] is a 66 y.o. male admitted on 10/28/2021 s/p Anterior cervical discectomies and fusion.    Pharmacy has been consulted for Vancomycin dosing. Patient with drain placed therefore will continue vancomycin until discontinued by provider. Last Scr 3 weeks ago was 1.66, Will order stat BMP to assess renal function for dosing. Preop dose given at 1105. Scr 1.57  Plan: Vancomycin 750mg  IV q12h until d/c'd by provider (eAUC 450, Scr 1.57)  Height: 6' (182.9 cm) Weight: 98.9 kg (218 lb) IBW/kg (Calculated) : 77.6  Temp (24hrs), Avg:98.7 F (37.1 C), Min:98.3 F (36.8 C), Max:99 F (37.2 C)  No results for input(s): "WBC", "CREATININE", "LATICACIDVEN", "VANCOTROUGH", "VANCOPEAK", "VANCORANDOM", "GENTTROUGH", "GENTPEAK", "GENTRANDOM", "TOBRATROUGH", "TOBRAPEAK", "TOBRARND", "AMIKACINPEAK", "AMIKACINTROU", "AMIKACIN" in the last 168 hours.  CrCl cannot be calculated (Patient's most recent lab result is older than the maximum 21 days allowed.).    Allergies  Allergen Reactions   Trulicity [Dulaglutide] Hives   Januvia [Sitagliptin] Other (See Comments)    Red eyes   Other     Stomach medicine, not sure of name. Had a mini stroke   Penicillins Swelling    Arm swelling Has patient had a PCN reaction causing immediate rash, facial/tongue/throat swelling, SOB or lightheadedness with hypotension: No Has patient had a PCN reaction causing severe rash involving mucus membranes or skin necrosis: No Has patient had a PCN reaction that required hospitalization No Has patient had a PCN reaction occurring within the last 10 years: No If all of the above answers are "NO", then may proceed with Cephalosporin use.    Kaileen Bronkema A. , PharmD, BCPS, FNKF Clinical Pharmacist [place] Please utilize Amion for appropriate phone number to reach the unit pharmacist [facility])  10/28/2021 4:46 PM

## 2021-10-28 NOTE — Anesthesia Postprocedure Evaluation (Signed)
Anesthesia Post Note  Patient: Thomas [name]  Procedure(s) Performed: ACDF - C3-C4 - C4-C5 - C5-C6 (Spine Cervical)     Patient location during evaluation: PACU Anesthesia Type: General Level of consciousness: awake and alert Pain management: pain level controlled Vital Signs Assessment: post-procedure vital signs reviewed and stable Respiratory status: spontaneous breathing, nonlabored ventilation, respiratory function stable and patient connected to nasal cannula oxygen Cardiovascular status: blood pressure returned to baseline and stable Postop Assessment: no apparent nausea or vomiting Anesthetic complications: no   No notable events documented.  Last Vitals:  Vitals:   10/28/21 1500 10/28/21 1515  BP: 122/63 120/69  Pulse: 82 81  Resp: (!) 22 (!) 23  Temp:    SpO2: 93% 93%    Last Pain:  Vitals:   10/28/21 1512  TempSrc:   PainSc: 9                  Trevor Iha

## 2021-10-28 NOTE — Op Note (Signed)
Reoperative diagnosis: Cervical spondylitic myelopathy from severe cervical stenosis with cord compression at C3-4, C4-5, C5-6.  Postoperative diagnosis: Same.  Procedure: Anterior cervical discectomies and fusion at C3-4, C4-5, C5-6 utilizing the globus titanium cages packed with locally harvested autograft mixed with Vivigen.  Anterior cervical plating utilizing the Atlantis translational plating system.  Surgeon: [name].  Assistant: Julien Girt.  Anesthesia: General  EBL minimal  HPI: 66 years old and progressive worsening neck pain bilateral shoulder and arm pain numbness tingling weakness in his hands difficulty walking.  Work-up revealed severe cord compression and stenosis with kyphosis at C3-4, C4-5, C5-6.  Due to patient's progression of clinical syndrome imaging findings and failed conservative treatment I recommended anterior cervical discectomies and fusion at those 3 levels.  I extensively reviewed the risks and benefits of the operation with the patient as well as perioperative course expectations of outcome and alternatives of surgery and he understood and agreed to proceed forward.  Operative procedure: Patient was brought into the OR was induced under general anesthesia positioned supine the neck in slight extension 5 pounds halter traction.  The right side of his neck was prepped and draped in routine sterile fashion.  Preoperative x-ray localized the appropriate level so a curvilinear incision was made just off the midline to the and the border of the sternocleidomastoid and the superficial layer of platysma was dissected out divided longitudinally.  The avascular plane between the sternomastoid and strap muscles was developed down to the prevertebral fascia and prevertebral fascia was dissected away with Kitners.  Intraoperative x-ray confirmed defecation appropriate level so the longus goes reflected laterally and self-retaining retractors were placed.  Anterior osteophytes  were bitten off with a Leksell rongeur.  All 3 disc bases were drilled down capturing the bone shavings and mucus trap.  Under microscopic illumination first working at C3-4 this disc base was drilled down there was soft disc material migrating subligamentous causing severe cord compression primarily on the right this was all removed aggressive under biting both endplates decompress central canal both C4 pedicles were identified both C4 nerve roots were decompressed flush with the pedicle.  This was packed with Gelfoam attention taken to C4-5 pathology he was marked collapse and marked uncinate hypertrophy large posterior osteophytes aggressively under biting both endplates removing the posterior large ligament decompressing central canal and both C5 pedicles identified both C5 nerve roots were radically decompressed flush with the pedicle.  This was then packed with Gelfoam tension taken at C5-6 C5-6 there was a large left-sided spur and after removal of the posterior large ligament and identified the C6 pedicle decompress the C6 nerve root and then immediately superior and underneath the C6 nerve root was a large spur pushing down the C6 nerve root from above I tease this off of the nerve root with a nerve hook and removed in piecemeal fashion decompressing the C6 foramen and nerve root.  This spur was predominantly dorsal to the nerve root and so I was limited my access but I extended his far laterally and posteriorly as I could safely.  But the foramen easily excepted a nerve hook after decompression.  I decompressed the contralateral side the right side as well after adequate central and foraminal decompression been achieved at all levels I opened up and selected 6 mm implants all of them packed with locally harvested autograft mixed with Vivigen and inserted 2 mm deep to the anterior vertebral line.  I then selected a 50 7/2 Atlantis translational plate all  screws had excellent purchase locking mechanisms were  engaged and postop fluoroscopy confirmed good position of all the plants.  Then the wound was copiously irrigated to Kassim states was maintained I did prior to plate placement packed and extensive mount of autograft mix laterally to the cages and anteriorly to cages underneath the plate.  Then placed a JP drain and closed the wound in layers with interrupted Vicryl and a running 4 subcuticular Dermabond benzoin Steri-Strips and a sterile dressing was applied patient recovery in stable condition.  At the end the case all needle count sponge counts were correct.

## 2021-10-28 NOTE — Anesthesia Preprocedure Evaluation (Addendum)
Anesthesia Evaluation  Patient identified by MRN, date of birth, ID band Patient awake    Reviewed: Allergy & Precautions, NPO status , Patient's Chart, lab work & pertinent test results  Airway Mallampati: I       Dental  (+) Edentulous Upper, Edentulous Lower   Pulmonary neg pulmonary ROS,    Pulmonary exam normal        Cardiovascular hypertension, Pt. on medications Normal cardiovascular exam     Neuro/Psych negative psych ROS   GI/Hepatic negative GI ROS, Neg liver ROS,   Endo/Other  diabetes, Type 2, Oral Hypoglycemic AgentsHypothyroidism   Renal/GU Renal diseaseCKD III  negative genitourinary   Musculoskeletal negative musculoskeletal ROS (+)   Abdominal Normal abdominal exam  (+)   Peds  Hematology   Anesthesia Other Findings IMPRESSIONS Left ventricular ejection fraction, by estimation, is 60 to 65%. The left ventricle has normal function. The left ventricle has no regional wall motion abnormalities. There is mild left ventricular hypertrophy. Left ventricular diastolic parameters are consistent with Grade I diastolic dysfunction (impaired relaxation). The average left ventricular global longitudinal strain is -18.3 %. The global longitudinal strain is normal. 1. Right ventricular systolic function is normal. The right ventricular size is normal. Tricuspid regurgitation signal is inadequate for assessing PA pressure. 2. 3. The mitral valve is degenerative. Trivial mitral valve regurgitation. The aortic valve is tricuspid. There is moderate calcification of the aortic valve. Aortic valve regurgitation is mild. Severe aortic valve stenosis. Aortic regurgitation PHT measures 677 msec. Aortic valve mean gradient measures 39.2 mmHg. Aortic valve Vmax measures 4.14 m/s. Dimentionless index 0.29. 4. The inferior vena cava is normal in size with greater than 50% respiratory variability, suggesting right atrial  pressure of 3 mmHg. 5. Comparison(s): Prior images unable to  Reproductive/Obstetrics                            Anesthesia Physical Anesthesia Plan  ASA: 3  Anesthesia Plan: General   Post-op Pain Management: Dilaudid IV and Ofirmev IV (intra-op)*   Induction: Intravenous  PONV Risk Score and Plan: 3 and Ondansetron, Midazolam and Treatment may vary due to age or medical condition  Airway Management Planned: Oral ETT  Additional Equipment: Arterial line  Intra-op Plan:   Post-operative Plan: Extubation in OR  Informed Consent: I have reviewed the patients History and Physical, chart, labs and discussed the procedure including the risks, benefits and alternatives for the proposed anesthesia with the patient or authorized representative who has indicated his/her understanding and acceptance.     Dental advisory given  Plan Discussed with: CRNA  Anesthesia Plan Comments:         Anesthesia Quick Evaluation

## 2021-10-28 NOTE — Anesthesia Procedure Notes (Signed)
Arterial Line Insertion Performed by: CRNA  Preanesthetic checklist: patient identified, IV checked, site marked, risks and benefits discussed, surgical consent, monitors and equipment checked and pre-op evaluation Lidocaine 1% used for infiltration Left, radial was placed Catheter size: 20 G  Attempts: 1 Procedure performed without using ultrasound guided technique. Ultrasound Notes:anatomy identified and needle tip was noted to be adjacent to the nerve/plexus identified Following insertion, dressing applied. Patient tolerated the procedure well with no immediate complications.

## 2021-10-28 NOTE — Anesthesia Procedure Notes (Signed)
Procedure Name: Intubation Date/Time: 10/28/2021 10:54 AM  Performed by: Macie Burows, CRNAPre-anesthesia Checklist: Patient identified, Emergency Drugs available, Suction available and Patient being monitored Patient Re-evaluated:Patient Re-evaluated prior to induction Oxygen Delivery Method: Circle system utilized Preoxygenation: Pre-oxygenation with 100% oxygen Induction Type: IV induction Ventilation: Mask ventilation without difficulty Laryngoscope Size: Glidescope and 3 Grade View: Grade I Tube type: Oral Tube size: 7.0 mm Number of attempts: 1 Airway Equipment and Method: Stylet and Oral airway Placement Confirmation: ETT inserted through vocal cords under direct vision, positive ETCO2 and breath sounds checked- equal and bilateral Secured at: 21 cm Tube secured with: Tape Dental Injury: Teeth and Oropharynx as per pre-operative assessment

## 2021-10-28 NOTE — Transfer of Care (Signed)
Immediate Anesthesia Transfer of Care Note  Patient: Thomas [name]  Procedure(s) Performed: ACDF - C3-C4 - C4-C5 - C5-C6 (Spine Cervical)  Patient Location: PACU  Anesthesia Type:General  Level of Consciousness: awake, alert  and oriented  Airway & Oxygen Therapy: Patient Spontanous Breathing and Patient connected to face mask oxygen  Post-op Assessment: Report given to RN and Post -op Vital signs reviewed and stable  Post vital signs: Reviewed and stable  Last Vitals:  Vitals Value Taken Time  BP 115/104 10/28/21 1437  Temp    Pulse 86 10/28/21 1438  Resp 22 10/28/21 1438  SpO2 98 % 10/28/21 1438  Vitals shown include unvalidated device data.  Last Pain:  Vitals:   10/28/21 0935  TempSrc:   PainSc: 0-No pain      Patients Stated Pain Goal: 0 (10/28/21 0935)  Complications: No notable events documented.

## 2021-10-29 ENCOUNTER — Encounter (HOSPITAL_COMMUNITY): Payer: Self-pay | Admitting: Neurosurgery

## 2021-10-29 ENCOUNTER — Other Ambulatory Visit: Payer: Self-pay

## 2021-10-29 LAB — GLUCOSE, CAPILLARY: Glucose-Capillary: 126 mg/dL — ABNORMAL HIGH (ref 70–99)

## 2021-10-29 NOTE — Evaluation (Addendum)
Occupational Therapy Evaluation Patient Details Name: Thomas [name] MRN: [id number] DOB: 1955/07/24 Today's Date: 10/29/2021   History of Present Illness Pt is a 66 y/o male presenting on 8/1 for elective ACDF C3-4, C4-5, C5-6. PMH includes:  DM, HTN, CVA, prior back surgery.   Clinical Impression   PTA patient independent using cane for mobility and ADLs. Pt educated on cervical precautions, brace mgmt and wear schedule, Adl compensatory techniques, recommendations, safety and DME. Pt has good support of spouse at home, who assisted him to get dressed today.  Educated pt on compensatory techniques for dressing and he demonstrates ability to complete simulated dressing with modified independence, transfers with modified independence using cane. Min cueing for precautions during session, but has good support at home. No further OT needs identified acutely, OT will sign off.      Recommendations for follow up therapy are one component of a multi-disciplinary discharge planning process, led by the attending physician.  Recommendations may be updated based on patient status, additional functional criteria and insurance authorization.   Follow Up Recommendations  No OT follow up    Assistance Recommended at Discharge PRN  Patient can return home with the following A little help with bathing/dressing/bathroom;Assist for transportation;Assistance with cooking/housework    Functional Status Assessment     Equipment Recommendations  None recommended by OT    Recommendations for Other Services       Precautions / Restrictions Precautions Precautions: Cervical Precaution Booklet Issued: Yes (comment) Precaution Comments: reviewed with pt Required Braces or Orthoses: Cervical Brace Cervical Brace: Soft collar;At all times Restrictions Weight Bearing Restrictions: No      Mobility Bed Mobility               General bed mobility comments: EOB upon entry    Transfers Overall  transfer level: Modified independent                 General transfer comment: used cane      Balance Overall balance assessment: Mild deficits observed, not formally tested                                         ADL either performed or assessed with clinical judgement   ADL Overall ADL's : Modified independent                                       General ADL Comments: pt demonstrates ability to complete figure 4 technique for LB bathing/dressing, reviewed UB dressing techniques and completing transferes with modified independnece.  Spouse will be able to assist as needed, pt reports she assisted him today.     Vision   Vision Assessment?: No apparent visual deficits     Perception     Praxis      Pertinent Vitals/Pain Pain Assessment Pain Assessment: Faces Faces Pain Scale: Hurts a little bit Pain Location: incisonal Pain Descriptors / Indicators: Discomfort, Operative site guarding Pain Intervention(s): Limited activity within patient's tolerance, Monitored during session, Repositioned     Hand Dominance     Extremity/Trunk Assessment Upper Extremity Assessment Upper Extremity Assessment: Overall WFL for tasks assessed   Lower Extremity Assessment Lower Extremity Assessment: Overall WFL for tasks assessed       Communication Communication Communication: No difficulties   Cognition Arousal/Alertness:  Awake/alert Behavior During Therapy: WFL for tasks assessed/performed Overall Cognitive Status: Within Functional Limits for tasks assessed                                       General Comments  spouse and grandson present    Exercises     Shoulder Instructions      Home Living Family/patient expects to be discharged to:: Private residence Living Arrangements: Spouse/significant other Available Help at Discharge: Family Type of Home: Mobile home Home Access: Ramped entrance     Home Layout:  One level     Bathroom Shower/Tub: Chief Strategy Officer: Handicapped height     Home Equipment: Cane - quad;Shower seat          Prior Functioning/Environment Prior Level of Function : Independent/Modified Independent             Mobility Comments: using cane for mobility ADLs Comments: independent        OT Problem List: Pain;Decreased knowledge of precautions;Decreased knowledge of use of DME or AE      OT Treatment/Interventions:      OT Goals(Current goals can be found in the care plan section) Acute Rehab OT Goals Patient Stated Goal: home OT Goal Formulation: With patient  OT Frequency:      Co-evaluation              AM-PAC OT "6 Clicks" Daily Activity     Outcome Measure Help from another person eating meals?: None Help from another person taking care of personal grooming?: None Help from another person toileting, which includes using toliet, bedpan, or urinal?: A Little Help from another person bathing (including washing, rinsing, drying)?: A Little Help from another person to put on and taking off regular upper body clothing?: A Little Help from another person to put on and taking off regular lower body clothing?: A Little 6 Click Score: 20   End of Session Equipment Utilized During Treatment: Other (comment);Cervical collar (cane) Nurse Communication: Mobility status  Activity Tolerance: Patient tolerated treatment well Patient left: with call bell/phone within reach;with family/visitor present;Other (comment) (seated EOB)  OT Visit Diagnosis: Pain Pain - part of body:  (cervcial incision)                Time: 9024-0973 OT Time Calculation (min): 11 min Charges:  OT General Charges $OT Visit: 1 Visit OT Evaluation $OT Eval Low Complexity: 1 Low  Barry Brunner, OT Acute Rehabilitation Services Office [phone]   Chancy Milroy 10/29/2021, 9:37 AM

## 2021-10-29 NOTE — Progress Notes (Signed)
Patient alert and oriented, mae's well, voiding adequate amount of urine, swallowing without difficulty, no c/o pain at time of discharge. Patient discharged home with family. Script and discharged instructions given to patient. Patient and family stated understanding of instructions given. Patient has an appointment with Dr. Cram 

## 2021-10-29 NOTE — Progress Notes (Signed)
PT Cancellation Note and Discharge  Patient Details Name: Thomas [name] MRN: [id number] DOB: 11/01/1955   Cancelled Treatment:    Reason Eval/Treat Not Completed: PT screened, no needs identified, will sign off. Discussed pt case with OT who reports pt does not require a formal PT evaluation at this time. PT signing off. If needs change, please reconsult.     Marylynn Pearson 10/29/2021, 9:08 AM  Conni Slipper, PT, DPT Acute Rehabilitation Services Secure Chat Preferred Office: [phone]

## 2021-10-29 NOTE — Discharge Summary (Signed)
  Physician Discharge Summary  Patient ID: Thomas [name] MRN: [id number] DOB/AGE: 1955/06/14 66 y.o. Estimated body mass index is 29.57 kg/m as calculated from the following:   Height as of this encounter: 6' (1.829 m).   Weight as of this encounter: 98.9 kg.   Admit date: 10/28/2021 Discharge date: 10/29/2021  Admission Diagnoses: Cervical spondylitic myelopathy  Discharge Diagnoses: Same Principal Problem:   Myelopathy concurrent with and due to spinal stenosis of cervical region [facility])   Discharged Condition: good  Hospital Course: Patient presented to hospital admitted for anterior cervical discectomies and fusion.  Operation went well patient went recovery in the floor on the floor was ambulating and voiding spontaneously tolerating regular diet and stable for discharge home.  Consults: Significant Diagnostic Studies: Treatments: ACDF C3-4, C4-5, C5-6. Discharge Exam: Blood pressure 115/64, pulse 65, temperature 98.3 F (36.8 C), temperature source Oral, resp. rate 20, height 6' (1.829 m), weight 98.9 kg, SpO2 93 %. Strength 5 out of 5 wound clean dry and intact  Disposition: Home   Allergies as of 10/29/2021       Reactions   Trulicity [dulaglutide] Hives   Januvia [sitagliptin] Other (See Comments)   Red eyes   Other    Stomach medicine, not sure of name. Had a mini stroke   Penicillins Swelling   Arm swelling Has patient had a PCN reaction causing immediate rash, facial/tongue/throat swelling, SOB or lightheadedness with hypotension: No Has patient had a PCN reaction causing severe rash involving mucus membranes or skin necrosis: No Has patient had a PCN reaction that required hospitalization No Has patient had a PCN reaction occurring within the last 10 years: No If all of the above answers are "NO", then may proceed with Cephalosporin use.        Medication List     TAKE these medications    Apple Cider Vinegar 500 MG Tabs Take 500 mg by mouth at  bedtime.   diclofenac sodium 1 % Gel Commonly known as: VOLTAREN Apply 1 Application topically 4 (four) times daily as needed (pain).   Farxiga 10 MG Tabs tablet Generic drug: dapagliflozin propanediol TAKE 1 TABLET BY MOUTH EVERY DAY   gabapentin 400 MG capsule Commonly known as: NEURONTIN Take 400 mg by mouth 3 (three) times daily.   glimepiride 4 MG tablet Commonly known as: AMARYL Take 1 tablet (4 mg total) by mouth daily with breakfast.   HYDROmorphone 2 MG tablet Commonly known as: DILAUDID Take 2 mg by mouth 4 (four) times daily as needed for severe pain.   levothyroxine 150 MCG tablet Commonly known as: SYNTHROID TAKE 1 TABLET BY MOUTH EVERY DAY BEFORE BREAKFAST   lisinopril 10 MG tablet Commonly known as: ZESTRIL TAKE 1 TABLET BY MOUTH TWICE A DAY AS DIRECTED   metFORMIN 1000 MG tablet Commonly known as: GLUCOPHAGE TAKE 1 TABLET (1,000 MG TOTAL) BY MOUTH TWICE A DAY WITH FOOD   nortriptyline 50 MG capsule Commonly known as: PAMELOR TAKE 3 CAPSULES BY MOUTH EVERY DAY AT BEDTIME   QC Tumeric Complex 500 MG Caps Generic drug: Turmeric Take 500 mg by mouth at bedtime.   simvastatin 40 MG tablet Commonly known as: ZOCOR TAKE 1 TABLET BY MOUTH ONCE DAILY         Signed: Mariam Dollar 10/29/2021, 7:48 AM

## 2021-10-29 NOTE — Discharge Instructions (Signed)
Wound Care  Keep the incision clean and dry remove the outer dressing in 3 days, leave the Steri-Strips intact. Wrap with Saran wrap for showers only Do not put any creams, lotions, or ointments on incision. Leave steri-strips on neck.  They will fall off by themselves.  Activity Walk each and every day, increasing distance each day. No lifting greater than 5 lbs.  Avoid excessive neck motion. No lifting no bending no twisting no driving or riding a car unless coming back and forth to see me. Wear neck brace at all times except when showering.   Diet Resume your normal diet.   Return to Work Will be discussed at you follow up appointment.  Call Your Doctor If Any of These Occur Redness, drainage, or swelling at the wound.  Temperature greater than 101 degrees. Severe pain not relieved by pain medication. Incision starts to come apart. Follow Up Appt Call  [phone])  for problems.  If you have any hardware placed in your spine, you will need an x-ray before your appointment.   

## 2021-10-30 ENCOUNTER — Other Ambulatory Visit: Payer: Self-pay | Admitting: *Deleted

## 2021-10-30 NOTE — Patient Outreach (Signed)
  Care Coordination [facility] Note Transition Care Management Unsuccessful Follow-up Telephone Call  Date of discharge and from where:  [facility] [phone]  Attempts:  2nd Attempt  Reason for unsuccessful TCM follow-up call:  Unable to leave message/ Mailbox full  Gean Maidens BSN RN [facility] [phone]

## 2021-10-30 NOTE — Patient Outreach (Signed)
  Care Coordination [facility] Note Transition Care Management Unsuccessful Follow-up Telephone Call  Date of discharge and from where:  [facility] [phone]  Attempts:  1st Attempt  Reason for unsuccessful TCM follow-up call:  No answer/busy  Gean Maidens BSN RN [facility] [phone]

## 2021-11-02 ENCOUNTER — Other Ambulatory Visit: Payer: Self-pay | Admitting: *Deleted

## 2021-11-02 NOTE — Patient Outreach (Signed)
  Care Coordination [facility] Note Transition Care Management Unsuccessful Follow-up Telephone Call  Date of discharge and from where:  [facility] [phone]  Attempts:  3rd Attempt  Reason for unsuccessful TCM follow-up call:  Unable to leave message Mailbox full unable to leave message  Gean Maidens BSN RN [facility] [phone]

## 2021-11-04 ENCOUNTER — Encounter (HOSPITAL_COMMUNITY): Payer: Self-pay

## 2021-11-04 ENCOUNTER — Emergency Department (HOSPITAL_COMMUNITY)
Admission: EM | Admit: 2021-11-04 | Discharge: 2021-11-04 | Disposition: A | Discharge status: Home / Self Care | Payer: Medicare Other | Attending: Emergency Medicine | Admitting: Emergency Medicine

## 2021-11-04 ENCOUNTER — Other Ambulatory Visit: Payer: Self-pay

## 2021-11-04 ENCOUNTER — Emergency Department (HOSPITAL_COMMUNITY): Payer: Medicare Other

## 2021-11-04 DIAGNOSIS — R404 Transient alteration of awareness: Secondary | ICD-10-CM | POA: Diagnosis not present

## 2021-11-04 DIAGNOSIS — M47814 Spondylosis without myelopathy or radiculopathy, thoracic region: Secondary | ICD-10-CM | POA: Diagnosis not present

## 2021-11-04 DIAGNOSIS — E1122 Type 2 diabetes mellitus with diabetic chronic kidney disease: Secondary | ICD-10-CM | POA: Diagnosis not present

## 2021-11-04 DIAGNOSIS — Z7984 Long term (current) use of oral hypoglycemic drugs: Secondary | ICD-10-CM | POA: Diagnosis not present

## 2021-11-04 DIAGNOSIS — I129 Hypertensive chronic kidney disease with stage 1 through stage 4 chronic kidney disease, or unspecified chronic kidney disease: Secondary | ICD-10-CM | POA: Insufficient documentation

## 2021-11-04 DIAGNOSIS — Z79899 Other long term (current) drug therapy: Secondary | ICD-10-CM | POA: Insufficient documentation

## 2021-11-04 DIAGNOSIS — R52 Pain, unspecified: Secondary | ICD-10-CM | POA: Diagnosis not present

## 2021-11-04 DIAGNOSIS — R55 Syncope and collapse: Secondary | ICD-10-CM | POA: Diagnosis not present

## 2021-11-04 DIAGNOSIS — N183 Chronic kidney disease, stage 3 unspecified: Secondary | ICD-10-CM | POA: Insufficient documentation

## 2021-11-04 DIAGNOSIS — R739 Hyperglycemia, unspecified: Secondary | ICD-10-CM | POA: Diagnosis not present

## 2021-11-04 DIAGNOSIS — Z743 Need for continuous supervision: Secondary | ICD-10-CM | POA: Diagnosis not present

## 2021-11-04 LAB — BASIC METABOLIC PANEL
Anion gap: 10 (ref 5–15)
BUN: 21 mg/dL (ref 8–23)
CO2: 25 mmol/L (ref 22–32)
Calcium: 9.4 mg/dL (ref 8.9–10.3)
Chloride: 106 mmol/L (ref 98–111)
Creatinine, Ser: 1.27 mg/dL — ABNORMAL HIGH (ref 0.61–1.24)
GFR, Estimated: >60 mL/min (ref >60)
Glucose, Bld: 150 mg/dL — ABNORMAL HIGH (ref 70–99)
Potassium: 3.9 mmol/L (ref 3.5–5.1)
Sodium: 141 mmol/L (ref 135–145)

## 2021-11-04 LAB — CBC WITH DIFFERENTIAL/PLATELET
Abs Immature Granulocytes: 0.02 10*3/uL (ref 0.00–0.07)
Basophils Absolute: 0 10*3/uL (ref 0.0–0.1)
Basophils Relative: 0 %
Eosinophils Absolute: 0.1 10*3/uL (ref 0.0–0.5)
Eosinophils Relative: 1 %
HCT: 41.5 % (ref 39.0–52.0)
Hemoglobin: 14 g/dL (ref 13.0–17.0)
Immature Granulocytes: 0 %
Lymphocytes Relative: 25 %
Lymphs Abs: 1.8 10*3/uL (ref 0.7–4.0)
MCH: 31 pg (ref 26.0–34.0)
MCHC: 33.7 g/dL (ref 30.0–36.0)
MCV: 91.8 fL (ref 80.0–100.0)
Monocytes Absolute: 0.5 10*3/uL (ref 0.1–1.0)
Monocytes Relative: 7 %
Neutro Abs: 4.8 10*3/uL (ref 1.7–7.7)
Neutrophils Relative %: 67 %
Platelets: 329 10*3/uL (ref 150–400)
RBC: 4.52 MIL/uL (ref 4.22–5.81)
RDW: 13.2 % (ref 11.5–15.5)
WBC: 7.3 10*3/uL (ref 4.0–10.5)
nRBC: 0 % (ref 0.0–0.2)

## 2021-11-04 LAB — URINALYSIS, ROUTINE W REFLEX MICROSCOPIC
Bacteria, UA: NONE SEEN
Bilirubin Urine: NEGATIVE
Glucose, UA: >=500 mg/dL — AB
Hgb urine dipstick: NEGATIVE
Ketones, ur: NEGATIVE mg/dL
Leukocytes,Ua: NEGATIVE
Nitrite: NEGATIVE
Protein, ur: NEGATIVE mg/dL
Specific Gravity, Urine: 1.015 (ref 1.005–1.030)
pH: 5 (ref 5.0–8.0)

## 2021-11-04 LAB — CBG MONITORING, ED: Glucose-Capillary: 145 mg/dL — ABNORMAL HIGH (ref 70–99)

## 2021-11-04 NOTE — ED Provider Notes (Signed)
[facility] EMERGENCY DEPARTMENT Provider Note   CSN: [id number] Arrival date & time: 11/04/21  1901     History  Chief Complaint  Patient presents with   Loss of Consciousness    Thomas [name] is a 66 y.o. male with a history of cervical spinal stenosis and myelopathy 1 week out from discectomies and fusion, also history of type 2 diabetes, hypertension, and stage III kidney disease and aortic stenosis presenting for evaluation of transient altered mental status.  The patient has been spending most of the past week in his recliner chair as he is recovering from his recent surgery, is unable to sleep supine at this time.  He was taken Dilaudid tablets through yesterday but states that the medication was too strong for him, he was given ibuprofen and Tylenol around 1230 today by his wife and he fell asleep in his recliner chair.  She noted 4 hours later that he was still sleeping which she found unusual, and went to wake him up and he would not wake.  She states he was breathing but refused to wake.  She called a daughter who perform sternal rub and he still did not respond at which time they called EMS and with their sternal rub he did wake.  He was transported here by EMS, but states that he has no complaints at this time except ongoing discomfort with his recent surgery.  Specifically he denies headache, chest pain, shortness of breath, nausea or vomiting.  His appetite has been reduced since his surgery but is starting to improve.  He does endorse he has not been sleeping well.  He has had no treatment prior to arrival.  His CBG as checked by EMS was 230.  He did eat lunch prior to this event.  The history is provided by the patient, the spouse and a relative (spouse and daughter at bedside).       Home Medications Prior to Admission medications   Medication Sig Start Date End Date Taking? Authorizing Provider  Apple Cider Vinegar 500 MG TABS Take 500 mg by mouth at bedtime.    [provider]  diclofenac sodium (VOLTAREN) 1 % GEL Apply 1 Application topically 4 (four) times daily as needed (pain). 05/25/17   [provider]  FARXIGA 10 MG TABS tablet TAKE 1 TABLET BY MOUTH EVERY DAY 07/24/21   Ardith Dark, MD  gabapentin (NEURONTIN) 400 MG capsule Take 400 mg by mouth 3 (three) times daily.    [provider]  glimepiride (AMARYL) 4 MG tablet Take 1 tablet (4 mg total) by mouth daily with breakfast. 10/06/21   Ardith Dark, MD  HYDROmorphone (DILAUDID) 2 MG tablet Take 2 mg by mouth 4 (four) times daily as needed for severe pain.    [provider]  levothyroxine (SYNTHROID) 150 MCG tablet TAKE 1 TABLET BY MOUTH EVERY DAY BEFORE BREAKFAST 10/27/21   Ardith Dark, MD  lisinopril (ZESTRIL) 10 MG tablet TAKE 1 TABLET BY MOUTH TWICE A DAY AS DIRECTED 09/21/21   Ardith Dark, MD  metFORMIN (GLUCOPHAGE) 1000 MG tablet TAKE 1 TABLET (1,000 MG TOTAL) BY MOUTH TWICE A DAY WITH FOOD 10/05/21   Ardith Dark, MD  nortriptyline (PAMELOR) 50 MG capsule TAKE 3 CAPSULES BY MOUTH EVERY DAY AT BEDTIME 09/24/21   Ardith Dark, MD  simvastatin (ZOCOR) 40 MG tablet TAKE 1 TABLET BY MOUTH ONCE DAILY 09/28/21   Ardith Dark, MD  Turmeric (QC TUMERIC  COMPLEX) 500 MG CAPS Take 500 mg by mouth at bedtime.    [provider]      Allergies    Trulicity [dulaglutide], Januvia [sitagliptin], Other, and Penicillins    Review of Systems   Review of Systems  Constitutional:  Positive for fatigue. Negative for fever.  HENT:  Negative for congestion and sore throat.   Eyes: Negative.   Respiratory:  Negative for chest tightness and shortness of breath.   Cardiovascular:  Negative for chest pain.  Gastrointestinal:  Negative for abdominal pain and nausea.  Genitourinary: Negative.   Musculoskeletal:  Positive for neck pain. Negative for arthralgias and joint swelling.  Skin: Negative.  Negative for rash and wound.  Neurological:  Negative for  dizziness, weakness, light-headedness, numbness and headaches.  Psychiatric/Behavioral: Negative.    All other systems reviewed and are negative.   Physical Exam Updated Vital Signs BP 127/67   Pulse 76   Temp 98.1 F (36.7 C) (Oral)   Resp 18   Ht 6' (1.829 m)   Wt 98.9 kg   SpO2 99%   BMI 29.57 kg/m  Physical Exam Vitals and nursing note reviewed.  Constitutional:      Appearance: He is well-developed.  HENT:     Head: Normocephalic and atraumatic.     Right Ear: Tympanic membrane normal.     Left Ear: Tympanic membrane normal.  Eyes:     Extraocular Movements: Extraocular movements intact.     Conjunctiva/sclera: Conjunctivae normal.     Pupils: Pupils are equal, round, and reactive to light.  Cardiovascular:     Rate and Rhythm: Normal rate and regular rhythm.     Heart sounds: Normal heart sounds.  Pulmonary:     Effort: Pulmonary effort is normal.     Breath sounds: Normal breath sounds. No wheezing.  Abdominal:     General: Bowel sounds are normal.     Palpations: Abdomen is soft.     Tenderness: There is no abdominal tenderness.  Musculoskeletal:        General: Normal range of motion.     Cervical back: Normal range of motion and neck supple.  Lymphadenopathy:     Cervical: No cervical adenopathy.  Skin:    General: Skin is warm and dry.     Findings: No rash.  Neurological:     General: No focal deficit present.     Mental Status: He is alert and oriented to person, place, and time.     GCS: GCS eye subscore is 4. GCS verbal subscore is 5. GCS motor subscore is 6.     Cranial Nerves: No cranial nerve deficit.     Sensory: No sensory deficit.     Coordination: Coordination normal.     Gait: Gait normal.     Deep Tendon Reflexes: Reflexes normal.     Comments: Normal heel-shin, normal rapid alternating movements. Cranial nerves III-XII intact.  No pronator drift.  Psychiatric:        Speech: Speech normal.        Behavior: Behavior normal.         Thought Content: Thought content normal.     ED Results / Procedures / Treatments   Labs (all labs ordered are listed, but only abnormal results are displayed) Labs Reviewed  BASIC METABOLIC PANEL - Abnormal; Notable for the following components:      Result Value   Glucose, Bld 150 (*)    Creatinine, Ser 1.27 (*)  All other components within normal limits  URINALYSIS, ROUTINE W REFLEX MICROSCOPIC - Abnormal; Notable for the following components:   Glucose, UA >=500 (*)    All other components within normal limits  CBG MONITORING, ED - Abnormal; Notable for the following components:   Glucose-Capillary 145 (*)    All other components within normal limits  CBC WITH DIFFERENTIAL/PLATELET    EKG None  Radiology DG Chest Portable 1 View  Result Date: 11/04/2021 CLINICAL DATA:  Syncopal episode with altered mental status. EXAM: PORTABLE CHEST 1 VIEW COMPARISON:  PA and lateral 08/17/2006. FINDINGS: The cardiac size is normal. The mediastinum is normally outlined. Vascular markings are normal caliber. The lungs clear, as visualized, with obscuration of the right lower lung field due to elevated right hemidiaphragm with incidentally noted colonic interposition of the hepatic flexure. No pleural effusion is seen.  Moderate thoracic spondylosis. IMPRESSION: 1. No acute chest process as far as visualized, with right lower lung field obscured by elevation of the right hemidiaphragm, with incidental colonic interposition. 2. Thoracic spondylosis. Electronically Signed   By: Telford Nab M.D.   On: 11/04/2021 21:30    Procedures Procedures    Medications Ordered in ED Medications - No data to display  ED Course/ Medical Decision Making/ A&P                           Medical Decision Making Patient with transient altered awareness, he was asleep in his recliner chair and family was unable to wake him.  He did not fall, this does not appear to have been a syncopal event.  I suspect he  may have just been deeply sleeping as he has not been getting good rest since his surgery.  This could also be residual side effect of the anesthesia.  He confirms an wife also confirms he has not had Dilaudid since yesterday.  Wife is providing him his pain medications.  He is awake, alert, ambulatory here without any complaints.  Labs are stable, vital signs are stable, he was given return precautions, otherwise we will plan follow-up with his primary provider as needed.  Amount and/or Complexity of Data Reviewed Labs: ordered.    Details: Reviewed, stable. Radiology: ordered.    Details: Chest x-ray without acute cardiopulmonary findings.  Risk Decision regarding hospitalization. Risk Details: No indication for admission at this time.           Final Clinical Impression(s) / ED Diagnoses Final diagnoses:  Altered awareness, transient    Rx / DC Orders ED Discharge Orders     None         Landis Martins 11/04/21 2321    Daleen Bo, MD 11/05/21 0010

## 2021-11-04 NOTE — Discharge Instructions (Signed)
Your exam and lab work tonight are reassuring and since you are feeling better, you do not need further testing tonight.  Plan to followup with your primary MD for any problems or concerns,  returning here for any return of todays symptoms.

## 2021-11-04 NOTE — ED Triage Notes (Signed)
[facility]- pt presents with syncopal episode that happened before arrival. Pt is diaphoretic. Witnessed by wife. Pt denies any pain or hitting head. Pt is not on blood thinners. Pt A&O x4.     Recently had neck surgery on July 25th  CBG 230 with EMS

## 2021-11-18 ENCOUNTER — Other Ambulatory Visit: Payer: Self-pay | Admitting: Family Medicine

## 2021-12-03 DIAGNOSIS — M542 Cervicalgia: Secondary | ICD-10-CM | POA: Diagnosis not present

## 2021-12-09 DIAGNOSIS — M25561 Pain in right knee: Secondary | ICD-10-CM | POA: Diagnosis not present

## 2021-12-09 DIAGNOSIS — M961 Postlaminectomy syndrome, not elsewhere classified: Secondary | ICD-10-CM | POA: Diagnosis not present

## 2021-12-09 DIAGNOSIS — M545 Low back pain, unspecified: Secondary | ICD-10-CM | POA: Diagnosis not present

## 2021-12-09 DIAGNOSIS — Z79891 Long term (current) use of opiate analgesic: Secondary | ICD-10-CM | POA: Diagnosis not present

## 2021-12-09 DIAGNOSIS — M542 Cervicalgia: Secondary | ICD-10-CM | POA: Diagnosis not present

## 2021-12-09 DIAGNOSIS — M25562 Pain in left knee: Secondary | ICD-10-CM | POA: Diagnosis not present

## 2021-12-11 ENCOUNTER — Other Ambulatory Visit: Payer: Self-pay | Admitting: Family Medicine

## 2021-12-21 ENCOUNTER — Encounter: Payer: Self-pay | Admitting: *Deleted

## 2022-01-06 ENCOUNTER — Encounter: Payer: Self-pay | Admitting: Family Medicine

## 2022-01-06 ENCOUNTER — Ambulatory Visit (INDEPENDENT_AMBULATORY_CARE_PROVIDER_SITE_OTHER): Payer: Medicare Other | Admitting: Family Medicine

## 2022-01-06 VITALS — BP 128/78 | HR 67 | Temp 97.7°F | Ht 72.0 in | Wt 212.6 lb

## 2022-01-06 DIAGNOSIS — G992 Myelopathy in diseases classified elsewhere: Secondary | ICD-10-CM

## 2022-01-06 DIAGNOSIS — E1100 Type 2 diabetes mellitus with hyperosmolarity without nonketotic hyperglycemic-hyperosmolar coma (NKHHC): Secondary | ICD-10-CM | POA: Diagnosis not present

## 2022-01-06 DIAGNOSIS — M4802 Spinal stenosis, cervical region: Secondary | ICD-10-CM

## 2022-01-06 DIAGNOSIS — I152 Hypertension secondary to endocrine disorders: Secondary | ICD-10-CM

## 2022-01-06 DIAGNOSIS — E1159 Type 2 diabetes mellitus with other circulatory complications: Secondary | ICD-10-CM | POA: Diagnosis not present

## 2022-01-06 LAB — POCT GLYCOSYLATED HEMOGLOBIN (HGB A1C): Hemoglobin A1C: 6.5 % — AB (ref 4.0–5.6)

## 2022-01-06 NOTE — Assessment & Plan Note (Signed)
He is stopped [place] due to cost for the last 4 to 5 months.  A1c today is 6.5.  It is okay for him to stay off of this at this point.  We will continue metformin 1000 mg twice daily and Amaryl 4 mg daily with breakfast.  Recheck A1c in 6 months.

## 2022-01-06 NOTE — Patient Instructions (Signed)
It was very nice to see you today!  Your blood sugar is at goal.  It is okay for you to stay off [place].  I will see back in 6 months.  Come back sooner if needed.   Take care, Dr Jerline Pain  PLEASE NOTE:  If you had any lab tests please let us know if you have not heard back within a few days. You may see your results on mychart before we have a chance to review them but we will give you a call once they are reviewed by [place]. If we ordered any referrals today, please let us know if you have not heard from their office within the next week.   Please try these tips to maintain a healthy lifestyle:  Eat at least 3 REAL meals and 1-2 snacks per day.  Aim for no more than 5 hours between eating.  If you eat breakfast, please do so within one hour of getting up.   Each meal should contain half fruits/vegetables, one quarter protein, and one quarter carbs (no bigger than a computer mouse)  Cut down on sweet beverages. This includes juice, soda, and sweet tea.   Drink at least 1 glass of water with each meal and aim for at least 8 glasses per day  Exercise at least 150 minutes every week.

## 2022-01-06 NOTE — Progress Notes (Signed)
   Thomas [name] is a 67 y.o. male who presents today for an office visit.  Assessment/Plan:  Chronic Problems Addressed Today: DM2 (diabetes mellitus, type 2) ([place]) He is stopped [place] due to cost for the last 4 to 5 months.  A1c today is 6.5.  It is okay for him to stay off of this at this point.  We will continue metformin 1000 mg twice daily and Amaryl 4 mg daily with breakfast.  Recheck A1c in 6 months.  Myelopathy concurrent with and due to spinal stenosis of cervical region [facility]) Doing well status post surgery.  Hypertension associated with diabetes ([place]) At goal today on current regimen lisinopril 10 mg twice daily     Subjective:  HPI:  See A/p for status of chronic conditions.         Objective:  Physical Exam: BP 128/78   Pulse 67   Temp 97.7 F (36.5 C) (Temporal)   Ht 6' (1.829 m)   Wt 212 lb 9.6 oz (96.4 kg)   SpO2 97%   BMI 28.83 kg/m   Gen: No acute distress, resting comfortably CV: Regular rate and rhythm with 2/6 systolic murmur appreciated Pulm: Normal work of breathing, clear to auscultation bilaterally with no crackles, wheezes, or rhonchi Neuro: Grossly normal, moves all extremities Psych: Normal affect and thought content      Pedro Oldenburg M. Jerline Pain, MD 01/06/2022 8:07 AM

## 2022-01-06 NOTE — Assessment & Plan Note (Signed)
At goal today on current regimen lisinopril 10 mg twice daily

## 2022-01-06 NOTE — Assessment & Plan Note (Signed)
Doing well status post surgery. 

## 2022-01-08 ENCOUNTER — Other Ambulatory Visit: Payer: Self-pay | Admitting: Family Medicine

## 2022-01-08 ENCOUNTER — Other Ambulatory Visit: Payer: Self-pay

## 2022-01-08 MED ORDER — LEVOTHYROXINE SODIUM 150 MCG PO TABS: 150.0000 ug | ORAL_TABLET | Freq: Every day | ORAL | 1 refills | Status: DC

## 2022-01-12 DIAGNOSIS — M542 Cervicalgia: Secondary | ICD-10-CM | POA: Diagnosis not present

## 2022-01-18 NOTE — Progress Notes (Signed)
Cardiology Office Note:    Date:  01/19/2022   ID:  [name], DOB [birth year], MRN [id number]  PCP:  Vivi Barrack, MD   [facility] Providers Cardiologist:  Shenea Giacobbe   Referring MD: Vivi Barrack, MD   Chief Complaint  Patient presents with   Hypertension     History of Present Illness:    Thomas [name] is a 66 y.o. male with a hx of DM, HLD, HTN, stroke. He was recently scheduled to have some neck  surgery with Dr. Saintclair Halsted.  He had an echocardiogram prior to his scheduled back surgery and he was found to have severe aortic stenosis.  Has occasional arm numbness / weakness.   Scheduled him for a working visit to discuss his aortic stenosis. His daughter is Thomas [name] ( our Psychologist, sport and exercise )   Seen with wife, Horris Latino.  Has been told he has a murmur years ago.  Has not had any cardiac issues related to his AS No cp, dyspnea, syncope, no presyncope  Is not limited from a cardiology standpoint Able to do yard work without any CP or dyspnea   Walks with a cane because of his R leg nerve / back issues.   Occasional palpitations that last for 1-2 seconds .      Avoids salt, Eats a fairly healthy diet Worked in [employer] mile [employer], and [employer] )  Drove a dump truck,  has been a Dealer   I have discussed the case with Dr. Saintclair Halsted. Jetson is myelopathic - has ongoing  compression of his spinal cord.  The case would be about 2.5 hours. Anticipated blood loss would be minimal .    Oct. 24, 2023  [name] is seen for follow up of his AS, recent neck surgery  His numbness in his arms is better  Still has lowe back issues with numbness in his R leg - no plans for surgery .        Past Medical History:  Diagnosis Date   Diabetes mellitus without complication ([place])    Hypercholesterolemia    Hypertension    Stroke [facility]) 1982   pt states he had a mini stroke in 1982    Past Surgical History:  Procedure Laterality Date    ANTERIOR CERVICAL DECOMP/DISCECTOMY FUSION N/A 10/28/2021   Procedure: ACDF - C3-C4 - C4-C5 - C5-C6;  Surgeon: Kary Kos, MD;  Location: [facility];  Service: Neurosurgery;  Laterality: N/A;   BACK SURGERY     HERNIA REPAIR      Current Medications: Current Meds  Medication Sig   Apple Cider Vinegar 500 MG TABS Take 500 mg by mouth at bedtime.   diclofenac sodium (VOLTAREN) 1 % GEL Apply 1 Application topically 4 (four) times daily as needed (pain).   gabapentin (NEURONTIN) 400 MG capsule Take 400 mg by mouth 3 (three) times daily.   glimepiride (AMARYL) 4 MG tablet Take 1 tablet (4 mg total) by mouth daily with breakfast.   HYDROmorphone (DILAUDID) 2 MG tablet Take 2 mg by mouth 4 (four) times daily as needed for severe pain.   levothyroxine (SYNTHROID) 150 MCG tablet Take 1 tablet (150 mcg total) by mouth daily before breakfast.   lisinopril (ZESTRIL) 10 MG tablet TAKE 1 TABLET BY MOUTH TWICE A DAY AS DIRECTED   metFORMIN (GLUCOPHAGE) 1000 MG tablet TAKE 1 TABLET (1,000 MG TOTAL) BY MOUTH TWICE A DAY WITH FOOD   nortriptyline (PAMELOR) 50 MG capsule TAKE  3 CAPSULES BY MOUTH EVERY DAY AT BEDTIME   simvastatin (ZOCOR) 40 MG tablet TAKE 1 TABLET BY MOUTH ONCE DAILY   Turmeric (QC TUMERIC COMPLEX) 500 MG CAPS Take 500 mg by mouth at bedtime.     Allergies:   Trulicity [dulaglutide], Januvia [sitagliptin], Other, and Penicillins   Social History   Socioeconomic History   Marital status: Married    Spouse name: Not on file   Number of children: Not on file   Years of education: Not on file   Highest education level: Not on file  Occupational History   Not on file  Tobacco Use   Smoking status: Never   Smokeless tobacco: Never  Vaping Use   Vaping Use: Never used  Substance and Sexual Activity   Alcohol use: Never    Comment: occ beer    Drug use: No   Sexual activity: Not on file  Other Topics Concern   Not on file  Social History Narrative   Not on file   Social  Determinants of Health   Financial Resource Strain: Not on file  Food Insecurity: Not on file  Transportation Needs: Not on file  Physical Activity: Not on file  Stress: Not on file  Social Connections: Not on file     Family History: The patient's family history includes Arthritis in his father and mother; Breast cancer in his sister; Diabetes in his mother; Heart attack in his mother; Hypertension in his mother; Skin cancer in his father.  ROS:   Please see the history of present illness.     All other systems reviewed and are negative.  EKGs/Labs/Other Studies Reviewed:    The following studies were reviewed today:   EKG:   Recent Labs: 10/06/2021: ALT 24; TSH 1.67 11/04/2021: BUN 21; Creatinine, Ser 1.27; Hemoglobin 14.0; Platelets 329; Potassium 3.9; Sodium 141  Recent Lipid Panel    Component Value Date/Time   CHOL 123 10/06/2021 0826   TRIG 158.0 (H) 10/06/2021 0826   HDL 33.00 (L) 10/06/2021 0826   CHOLHDL 4 10/06/2021 0826   VLDL 31.6 10/06/2021 0826   LDLCALC 59 10/06/2021 0826   LDLDIRECT 76.0 10/09/2020 0840     Risk Assessment/Calculations:           Physical Exam:    Physical Exam: Blood pressure 128/72, pulse 68, height 6' (1.829 m), weight 218 lb (98.9 kg), SpO2 94 %.       GEN:  Well nourished, well developed in no acute distress HEENT: Normal NECK: No JVD; No carotid bruits LYMPHATICS: No lymphadenopathy CARDIAC: RRR  3/6 systolic murmur , blunted radial pulses  RESPIRATORY:  Clear to auscultation without rales, wheezing or rhonchi  ABDOMEN: Soft, non-tender, non-distended MUSCULOSKELETAL:  No edema; No deformity  SKIN: Warm and dry NEUROLOGIC:  Alert and oriented x 3   ASSESSMENT:    1. Nonrheumatic aortic valve stenosis     PLAN:      Aortic stenosis: Moderate to severe.  I personally reviewed the echocardiogram.  His valve still opens fairly well but it clearly is calcified and stenotic.  He is healing up nicely from his  neck surgery.  At this point I think we need to get him enrolled in the TAVR clinic.  We will start to make arrangements to have him seen there in the next month or so.  I will plan on seeing him back in the office in 6 months.     2.  HLD :  well controlled.  3.  HTN:    well controlled.        Medication Adjustments/Labs and Tests Ordered: Current medicines are reviewed at length with the patient today.  Concerns regarding medicines are outlined above.  Orders Placed This Encounter  Procedures   Ambulatory referral to Structural Heart/Valve Clinic (only at [facility])   No orders of the defined types were placed in this encounter.    Patient Instructions  Medication Instructions:  Stop taking Potassium (Kd[place]) *If you need a refill on your cardiac medications before your next appointment, please call your pharmacy*     Your next appointment:   3 month(s)  The format for your next appointment:   In Person  Provider: Dr Elease Hashimoto              Signed, Kristeen Miss, MD  01/19/2022 6:11 PM    [facility]

## 2022-01-19 ENCOUNTER — Ambulatory Visit: Payer: Medicare Other | Attending: Cardiovascular Disease | Admitting: Cardiovascular Disease

## 2022-01-19 ENCOUNTER — Encounter: Payer: Self-pay | Admitting: Cardiovascular Disease

## 2022-01-19 VITALS — BP 128/72 | HR 68 | Ht 72.0 in | Wt 218.0 lb

## 2022-01-19 DIAGNOSIS — I350 Nonrheumatic aortic (valve) stenosis: Secondary | ICD-10-CM | POA: Diagnosis not present

## 2022-01-19 NOTE — Patient Instructions (Signed)
Medication Instructions:  Your physician recommends that you continue on your current medications as directed. Please refer to the Current Medication list given to you today.  *If you need a refill on your cardiac medications before your next appointment, please call your pharmacy*   Lab Work: NONE If you have labs (blood work) drawn today and your tests are completely normal, you will receive your results only by: [place] (if you have MyChart) OR A paper copy in the mail If you have any lab test that is abnormal or we need to change your treatment, we will call you to review the results.   Testing/Procedures: Ambulatory referral to structural heart clinic (aortic stenosis)   Follow-Up: At [facility], you and your health needs are our priority.  As part of our continuing mission to provide you with exceptional heart care, we have created designated Provider Care Teams.  These Care Teams include your primary Cardiologist (physician) and Advanced Practice Providers (APPs -  Physician Assistants and Nurse Practitioners) who all work together to provide you with the care you need, when you need it.  Your next appointment:   6 month(s)  The format for your next appointment:   In Person  Provider:   Mertie Moores, MD    Important Information About Sugar

## 2022-01-20 ENCOUNTER — Ambulatory Visit: Payer: Medicare Other | Admitting: Cardiovascular Disease

## 2022-01-26 ENCOUNTER — Telehealth: Payer: Self-pay | Admitting: Family Medicine

## 2022-01-26 ENCOUNTER — Encounter: Payer: Self-pay | Admitting: Cardiovascular Disease

## 2022-01-26 NOTE — Progress Notes (Unsigned)
Structural Heart Clinic Consult Note  No chief complaint on file.  History of Present Illness: 66 yo Thomas [name] with history of diabetes, HTN, hyperlipidemia, prior CVA, cervical myelopathy with cord compression and severe aortic stenosis who is here today as a new consult, referred by DR. Nahser, for further discussion regarding his aortic stenosis and possible AVR. He was planning to have cervical spine surgery in July 2023 and a murmur was noted on exam. Echo with LVEF=60-65%. Mild LVH. Trivial MR. Severe aortic valve stenosis with mean gradient of 39 mmHg, peak gradient 68.6 mmHg, AVA 0.82 cm2, DI 0.29. He had cord compression and his cervical surgery was felt to be urgent. He underwent cervical spine surgery on 10/28/21 with anterior cervical discectomies and did well with this. He was seen by Nahser before and after his neck surgery.   He tells me today that he *** He lives in *** He is retired Engineer, manufacturing***  Primary Care Physician: Ardith Dark, MD Primary Cardiologist: Nahser Referring Cardiologist: Nahser  Past Medical History:  Diagnosis Date   Aortic stenosis    Cervical myelopathy (HCC)    Diabetes mellitus without complication (HCC)    Hypercholesterolemia    Hypertension    Stroke [facility]) 1982   pt states he had a mini stroke in 1982    Past Surgical History:  Procedure Laterality Date   ANTERIOR CERVICAL DECOMP/DISCECTOMY FUSION N/A 10/28/2021   Procedure: ACDF - C3-C4 - C4-C5 - C5-C6;  Surgeon: [name], MD;  Location: [facility] OR;  Service: Neurosurgery;  Laterality: N/A;   BACK SURGERY     HERNIA REPAIR      Current Outpatient Medications  Medication Sig Dispense Refill   Apple Cider Vinegar 500 MG TABS Take 500 mg by mouth at bedtime.     diclofenac sodium (VOLTAREN) 1 % GEL Apply 1 Application topically 4 (four) times daily as needed (pain).  4   gabapentin (NEURONTIN) 400 MG capsule Take 400 mg by mouth 3 (three) times daily.     glimepiride (AMARYL) 4 MG tablet  Take 1 tablet (4 mg total) by mouth daily with breakfast. 90 tablet 3   HYDROmorphone (DILAUDID) 2 MG tablet Take 2 mg by mouth 4 (four) times daily as needed for severe pain.     Lancets (ONETOUCH DELICA PLUS LANCET33G) MISC Apply topically 2 (two) times daily.     levothyroxine (SYNTHROID) 150 MCG tablet Take 1 tablet (150 mcg total) by mouth daily before breakfast. 90 tablet 1   lisinopril (ZESTRIL) 10 MG tablet TAKE 1 TABLET BY MOUTH TWICE A DAY AS DIRECTED 180 tablet 1   metFORMIN (GLUCOPHAGE) 1000 MG tablet TAKE 1 TABLET (1,000 MG TOTAL) BY MOUTH TWICE A DAY WITH FOOD 180 tablet 0   nortriptyline (PAMELOR) 50 MG capsule TAKE 3 CAPSULES BY MOUTH EVERY DAY AT BEDTIME 270 capsule 1   ONETOUCH VERIO test strip daily.     simvastatin (ZOCOR) 40 MG tablet TAKE 1 TABLET BY MOUTH ONCE DAILY 90 tablet 2   Turmeric (QC TUMERIC COMPLEX) 500 MG CAPS Take 500 mg by mouth at bedtime.     No current facility-administered medications for this visit.    Allergies  Allergen Reactions   Trulicity [Dulaglutide] Hives   Januvia [Sitagliptin] Other (See Comments)    Red eyes   Other     Stomach medicine, not sure of name. Had a mini stroke   Penicillins Swelling    Arm swelling Has patient had a PCN reaction  causing immediate rash, facial/tongue/throat swelling, SOB or lightheadedness with hypotension: No Has patient had a PCN reaction causing severe rash involving mucus membranes or skin necrosis: No Has patient had a PCN reaction that required hospitalization No Has patient had a PCN reaction occurring within the last 10 years: No If all of the above answers are "NO", then may proceed with Cephalosporin use.    Social History   Socioeconomic History   Marital status: Married    Spouse name: Not on file   Number of children: Not on file   Years of education: Not on file   Highest education level: Not on file  Occupational History   Not on file  Tobacco Use   Smoking status: Never    Smokeless tobacco: Never  Vaping Use   Vaping Use: Never used  Substance and Sexual Activity   Alcohol use: Never    Comment: occ beer    Drug use: No   Sexual activity: Not on file  Other Topics Concern   Not on file  Social History Narrative   Not on file   Social Determinants of Health   Financial Resource Strain: Not on file  Food Insecurity: Not on file  Transportation Needs: Not on file  Physical Activity: Not on file  Stress: Not on file  Social Connections: Not on file  Intimate Partner Violence: Not on file    Family History  Problem Relation Age of Onset   Diabetes Mother    Heart attack Mother    Hypertension Mother    Arthritis Mother    Skin cancer Father    Arthritis Father    Breast cancer Sister     Review of Systems:  As stated in the HPI and otherwise negative.   There were no vitals taken for this visit.  Physical Examination: General: Well developed, well nourished, NAD  HEENT: OP clear, mucus membranes moist  SKIN: warm, dry. No rashes. Neuro: No focal deficits  Musculoskeletal: Muscle strength 5/5 all ext  Psychiatric: Mood and affect normal  Neck: No JVD, no carotid bruits, no thyromegaly, no lymphadenopathy.  Lungs:Clear bilaterally, no wheezes, rhonci, crackles Cardiovascular: Regular rate and rhythm. *** Loud, harsh, late peaking systolic murmur.  Abdomen:Soft. Bowel sounds present. Non-tender.  Extremities: *** No lower extremity edema. Pulses are 2 + in the bilateral DP/PT.  EKG:  EKG {ACTION; IS/IS [id number]} ordered today. The ekg ordered today demonstrates ***  Echo 10/15/21:    1. Left ventricular ejection fraction, by estimation, is 60 to 65%. The  left ventricle has normal function. The left ventricle has no regional  wall motion abnormalities. There is mild left ventricular hypertrophy.  Left ventricular diastolic parameters  are consistent with Grade I diastolic dysfunction (impaired relaxation).  The average left  ventricular global longitudinal strain is -18.3 %. The  global longitudinal strain is normal.   2. Right ventricular systolic function is normal. The right ventricular  size is normal. Tricuspid regurgitation signal is inadequate for assessing  PA pressure.   3. The mitral valve is degenerative. Trivial mitral valve regurgitation.   4. The aortic valve is tricuspid. There is moderate calcification of the  aortic valve. Aortic valve regurgitation is mild. Severe aortic valve  stenosis. Aortic regurgitation PHT measures 677 msec. Aortic valve mean  gradient measures 39.2 mmHg. Aortic  valve Vmax measures 4.14 m/s. Dimentionless index 0.29.   5. The inferior vena cava is normal in size with greater than 50%  respiratory variability, suggesting right  atrial pressure of 3 mmHg.   Comparison(s): Prior images unable to be directly viewed, comparison made  by report only. Aortic stenosis has progressed to severe range with  increase in mean gradient from 20 mmHg to 39 mmHg.   FINDINGS   Left Ventricle: Left ventricular ejection fraction, by estimation, is 60  to 65%. The left ventricle has normal function. The left ventricle has no  regional wall motion abnormalities. The average left ventricular global  longitudinal strain is -18.3 %.  The global longitudinal strain is normal. The left ventricular internal  cavity size was normal in size. There is mild left ventricular  hypertrophy. Left ventricular diastolic parameters are consistent with  Grade I diastolic dysfunction (impaired  relaxation).   Right Ventricle: The right ventricular size is normal. No increase in  right ventricular wall thickness. Right ventricular systolic function is  normal. Tricuspid regurgitation signal is inadequate for assessing PA  pressure.   Left Atrium: Left atrial size was normal in size.   Right Atrium: Right atrial size was normal in size.   Pericardium: There is no evidence of pericardial effusion.    Mitral Valve: The mitral valve is degenerative in appearance. Mild to  moderate mitral annular calcification. Trivial mitral valve regurgitation.   Tricuspid Valve: The tricuspid valve is grossly normal. Tricuspid valve  regurgitation is trivial.   Aortic Valve: The aortic valve is tricuspid. There is moderate  calcification of the aortic valve. There is mild to moderate aortic valve  annular calcification. Aortic valve regurgitation is mild. Aortic  regurgitation PHT measures 677 msec. Severe aortic  stenosis is present. Aortic valve mean gradient measures 39.2 mmHg. Aortic  valve peak gradient measures 68.6 mmHg. Aortic valve area, by VTI measures  0.82 cm.   Pulmonic Valve: The pulmonic valve was grossly normal. Pulmonic valve  regurgitation is trivial.   Aorta: The aortic root is normal in size and structure.   Venous: The inferior vena cava is normal in size with greater than 50%  respiratory variability, suggesting right atrial pressure of 3 mmHg.   IAS/Shunts: No atrial level shunt detected by color flow Doppler.      LEFT VENTRICLE  PLAX 2D  LVIDd:         4.57 cm     Diastology  LVIDs:         2.78 cm     LV e' medial:    6.74 cm/s  LV PW:         1.34 cm     LV E/e' medial:  17.2  LV IVS:        1.31 cm     LV e' lateral:   8.16 cm/s  LVOT diam:     1.90 cm     LV E/e' lateral: 14.2  LV SV:         69  LV SV Index:   32          2D Longitudinal Strain  LVOT Area:     2.84 cm    2D Strain GLS (A2C):   -18.0 %                             2D Strain GLS (A3C):   -19.1 %                             2D Strain GLS (A4C):   -  17.8 %  LV Volumes (MOD)           2D Strain GLS Avg:     -18.3 %  LV vol d, MOD A2C: 84.9 ml  LV vol d, MOD A4C: 93.1 ml  LV vol s, MOD A2C: 33.2 ml  LV vol s, MOD A4C: 37.3 ml  LV SV MOD A2C:     51.7 ml  LV SV MOD A4C:     93.1 ml  LV SV MOD BP:      Thomas.1 ml   RIGHT VENTRICLE  RV S prime:     9.79 cm/s  TAPSE (M-mode): 1.2 cm   LEFT  ATRIUM             Index        RIGHT ATRIUM          Index  LA diam:        2.40 cm 1.10 cm/m   RA Area:     9.20 cm  LA Vol (A2C):   41.5 ml 19.00 ml/m  RA Volume:   17.10 ml 7.83 ml/m  LA Vol (A4C):   43.0 ml 19.68 ml/m  LA Biplane Vol: 46.5 ml 21.29 ml/m   AORTIC VALVE  AV Area (Vmax):    0.83 cm  AV Area (Vmean):   0.89 cm  AV Area (VTI):     0.82 cm  AV Vmax:           414.25 cm/s  AV Vmean:          290.500 cm/s  AV VTI:            0.844 m  AV Peak Grad:      68.6 mmHg  AV Mean Grad:      39.2 mmHg  LVOT Vmax:         121.00 cm/s  LVOT Vmean:        90.900 cm/s  LVOT VTI:          0.243 m  LVOT/AV VTI ratio: 0.29  AI PHT:            677 msec     AORTA  Ao Root diam: 3.80 cm   MITRAL VALVE  MV Area (PHT): 3.46 cm     SHUNTS  MV Decel Time: 219 msec     Systemic VTI:  0.24 m  MV E velocity: 116.00 cm/s  Systemic Diam: 1.90 cm  MV A velocity: 129.00 cm/s  MV E/A ratio:  0.90   Recent Labs: 10/06/2021: ALT 24; TSH 1.67 11/04/2021: BUN 21; Creatinine, Ser 1.27; Hemoglobin 14.0; Platelets 329; Potassium 3.9; Sodium 141   Lipid Panel    Component Value Date/Time   CHOL 123 10/06/2021 0826   TRIG 158.0 (H) 10/06/2021 0826   HDL 33.00 (L) 10/06/2021 0826   CHOLHDL 4 10/06/2021 0826   VLDL 31.6 10/06/2021 0826   LDLCALC 59 10/06/2021 0826   LDLDIRECT 76.0 10/09/2020 0840     Wt Readings from Last 3 Encounters:  01/19/22 218 lb (98.9 kg)  01/06/22 212 lb 9.6 oz (96.4 kg)  11/04/21 218 lb (98.9 kg)    Assessment and Plan:   1. Severe Aortic Valve Stenosis: He has severe, stage *** D aortic valve stenosis. I have personally reviewed the echo images. The aortic valve is thickened, calcified with limited leaflet mobility. I think he would benefit from AVR. He would be a candidate for surgical AVR or TAVR but given his neurological issues, he  may do better clinically with TAVR.   I have reviewed the natural history of aortic stenosis with the patient and their  family members  who are present today. We have discussed the limitations of medical therapy and the poor prognosis associated with symptomatic aortic stenosis. We have reviewed potential treatment options, including palliative medical therapy, conventional surgical aortic valve replacement, and transcatheter aortic valve replacement. We discussed treatment options in the context of the patient's specific comorbid medical conditions.   He would like to proceed with planning for TAVR. I [facility]*. Risks and benefits of the cath procedure and the valve procedure are reviewed with the patient. After the cath, *** will have a cardiac CT, CTA of the chest/abdomen and pelvis, carotid artery dopplers, PT assessment and PFTs and will then be referred to see one of the CT surgeons on our TAVR team.     Labs/ tests ordered today include:  No orders of the defined types were placed in this encounter.  Disposition:   F/U with the valve team.   Signed, Verne Carrowhristopher Jewelene Mairena, MD, [facility]ACC 01/26/2022 4:01 PM    [facility] [address], [place], [place]  [address] Phone: [phone]; Fax: [phone]

## 2022-01-26 NOTE — Telephone Encounter (Signed)
Patient states he received his High Dose Flu vaccine on 01/21/22 at [facility] in [place]

## 2022-01-27 ENCOUNTER — Encounter: Payer: Self-pay | Admitting: Cardiovascular Disease

## 2022-01-27 ENCOUNTER — Ambulatory Visit: Payer: Medicare Other | Attending: Cardiovascular Disease | Admitting: Cardiovascular Disease

## 2022-01-27 VITALS — BP 130/70 | HR 76 | Ht 72.0 in | Wt 218.6 lb

## 2022-01-27 DIAGNOSIS — I350 Nonrheumatic aortic (valve) stenosis: Secondary | ICD-10-CM

## 2022-01-27 NOTE — Patient Instructions (Signed)
Medication Instructions:  No changes today. *If you need a refill on your cardiac medications before your next appointment, please call your pharmacy*   Lab Work: None today. If you have labs (blood work) drawn today and your tests are completely normal, you will receive your results only by: [facility] (if you have MyChart) OR A paper copy in the mail If you have any lab test that is abnormal or we need to change your treatment, we will call you to review the results.   Testing/Procedures: ECHO DUE FOR JANUARY 2024 -  Your physician has requested that you have an echocardiogram. Echocardiography is a painless test that uses sound waves to create images of your heart. It provides your doctor with information about the size and shape of your heart and how well your heart's chambers and valves are working. This procedure takes approximately one hour. There are no restrictions for this procedure. Please do NOT wear cologne, perfume, aftershave, or lotions (deodorant is allowed). Please arrive 15 minutes prior to your appointment time.    Follow-Up: At [facility], you and your health needs are our priority.  As part of our continuing mission to provide you with exceptional heart care, we have created designated Provider Care Teams.  These Care Teams include your primary Cardiologist (physician) and Advanced Practice Providers (APPs -  Physician Assistants and Nurse Practitioners) who all work together to provide you with the care you need, when you need it.   Your next appointment:   6 month(s)  The format for your next appointment:   In Person  Provider:   Lauree Chandler, MD     Important Information About Sugar

## 2022-01-28 NOTE — Telephone Encounter (Signed)
Immunization record updated.

## 2022-02-02 ENCOUNTER — Other Ambulatory Visit: Payer: Self-pay | Admitting: *Deleted

## 2022-02-02 MED ORDER — METFORMIN HCL 1000 MG PO TABS: ORAL_TABLET | ORAL | 0 refills | Status: DC

## 2022-02-03 ENCOUNTER — Other Ambulatory Visit: Payer: Self-pay | Admitting: *Deleted

## 2022-02-04 DIAGNOSIS — Z79891 Long term (current) use of opiate analgesic: Secondary | ICD-10-CM | POA: Diagnosis not present

## 2022-02-04 DIAGNOSIS — M25562 Pain in left knee: Secondary | ICD-10-CM | POA: Diagnosis not present

## 2022-02-04 DIAGNOSIS — M545 Low back pain, unspecified: Secondary | ICD-10-CM | POA: Diagnosis not present

## 2022-02-04 DIAGNOSIS — M542 Cervicalgia: Secondary | ICD-10-CM | POA: Diagnosis not present

## 2022-02-04 DIAGNOSIS — M25561 Pain in right knee: Secondary | ICD-10-CM | POA: Diagnosis not present

## 2022-02-04 DIAGNOSIS — M961 Postlaminectomy syndrome, not elsewhere classified: Secondary | ICD-10-CM | POA: Diagnosis not present

## 2022-03-04 ENCOUNTER — Ambulatory Visit (INDEPENDENT_AMBULATORY_CARE_PROVIDER_SITE_OTHER): Payer: Medicare Other

## 2022-03-04 VITALS — Wt 214.0 lb

## 2022-03-04 DIAGNOSIS — Z0000 Encounter for general adult medical examination without abnormal findings: Secondary | ICD-10-CM

## 2022-03-04 NOTE — Patient Instructions (Signed)
Thomas [name] , Thank you for taking time to come for your Medicare Wellness Visit. I appreciate your ongoing commitment to your health goals. Please review the following plan we discussed and let me know if I can assist you in the future.   These are the goals we discussed:  Goals      Patient Stated     Stay healthy         This is a list of the screening recommended for you and due dates:  Health Maintenance  Topic Date Due   DTaP/Tdap/Td vaccine (1 - Tdap) Never done   Yearly kidney health urinalysis for diabetes  07/21/2018   Eye exam for diabetics  02/15/2021   Complete foot exam   04/10/2021   COVID-19 Vaccine (4 - 2023-24 season) 11/27/2021   Zoster (Shingles) Vaccine (1 of 2) 04/08/2022*   Pneumonia Vaccine (3 - PPSV23 or PCV20) 01/07/2023*   Colon Cancer Screening  01/07/2023*   Hemoglobin A1C  07/08/2022   Yearly kidney function blood test for diabetes  11/05/2022   Medicare Annual Wellness Visit  03/05/2023   Flu Shot  Completed   Hepatitis C Screening: USPSTF Recommendation to screen - Ages 18-79 yo.  Completed   HPV Vaccine  Aged Out  *Topic was postponed. The date shown is not the original due date.    Advanced directives: Advance directive discussed with you today. Even though you declined this today please call our office should you change your mind and we can give you the proper paperwork for you to fill out.  Conditions/risks identified: stay healthy  Next appointment: Follow up in one year for your annual wellness visit.   Preventive Care 67 Years and Older, Male  Preventive care refers to lifestyle choices and visits with your health care provider that can promote health and wellness. What does preventive care include? A yearly physical exam. This is also called an annual well check. Dental exams once or twice a year. Routine eye exams. Ask your health care provider how often you should have your eyes checked. Personal lifestyle choices,  including: Daily care of your teeth and gums. Regular physical activity. Eating a healthy diet. Avoiding tobacco and drug use. Limiting alcohol use. Practicing safe sex. Taking low doses of aspirin every day. Taking vitamin and mineral supplements as recommended by your health care provider. What happens during an annual well check? The services and screenings done by your health care provider during your annual well check will depend on your age, overall health, lifestyle risk factors, and family history of disease. Counseling  Your health care provider may ask you questions about your: Alcohol use. Tobacco use. Drug use. Emotional well-being. Home and relationship well-being. Sexual activity. Eating habits. History of falls. Memory and ability to understand (cognition). Work and work Astronomer. Screening  You may have the following tests or measurements: Height, weight, and BMI. Blood pressure. Lipid and cholesterol levels. These may be checked every 5 years, or more frequently if you are over 24 years old. Skin check. Lung cancer screening. You may have this screening every year starting at age 67 if you have a 30-pack-year history of smoking and currently smoke or have quit within the past 15 years. Fecal occult blood test (FOBT) of the stool. You may have this test every year starting at age 67. Flexible sigmoidoscopy or colonoscopy. You may have a sigmoidoscopy every 5 years or a colonoscopy every 10 years starting at age 67. Prostate cancer screening. Recommendations  will vary depending on your family history and other risks. Hepatitis C blood test. Hepatitis B blood test. Sexually transmitted disease (STD) testing. Diabetes screening. This is done by checking your blood sugar (glucose) after you have not eaten for a while (fasting). You may have this done every 1-3 years. Abdominal aortic aneurysm (AAA) screening. You may need this if you are a current or former  smoker. Osteoporosis. You may be screened starting at age 67 if you are at high risk. Talk with your health care provider about your test results, treatment options, and if necessary, the need for more tests. Vaccines  Your health care provider may recommend certain vaccines, such as: Influenza vaccine. This is recommended every year. Tetanus, diphtheria, and acellular pertussis (Tdap, Td) vaccine. You may need a Td booster every 10 years. Zoster vaccine. You may need this after age 67. Pneumococcal 13-valent conjugate (PCV13) vaccine. One dose is recommended after age 67. Pneumococcal polysaccharide (PPSV23) vaccine. One dose is recommended after age 67. Talk to your health care provider about which screenings and vaccines you need and how often you need them. This information is not intended to replace advice given to you by your health care provider. Make sure you discuss any questions you have with your health care provider. Document Released: 04/11/2015 Document Revised: 12/03/2015 Document Reviewed: 01/14/2015 Elsevier Interactive Patient Education  2017 [place] Prevention in the Home Falls can cause injuries. They can happen to people of all ages. There are many things you can do to make your home safe and to help prevent falls. What can I do on the outside of my home? Regularly fix the edges of walkways and driveways and fix any cracks. Remove anything that might make you trip as you walk through a door, such as a raised step or threshold. Trim any bushes or trees on the path to your home. Use bright outdoor lighting. Clear any walking paths of anything that might make someone trip, such as rocks or tools. Regularly check to see if handrails are loose or broken. Make sure that both sides of any steps have handrails. Any raised decks and porches should have guardrails on the edges. Have any leaves, snow, or ice cleared regularly. Use sand or salt on walking paths during  winter. Clean up any spills in your garage right away. This includes oil or grease spills. What can I do in the bathroom? Use night lights. Install grab bars by the toilet and in the tub and shower. Do not use towel bars as grab bars. Use non-skid mats or decals in the tub or shower. If you need to sit down in the shower, use a plastic, non-slip stool. Keep the floor dry. Clean up any water that spills on the floor as soon as it happens. Remove soap buildup in the tub or shower regularly. Attach bath mats securely with double-sided non-slip rug tape. Do not have throw rugs and other things on the floor that can make you trip. What can I do in the bedroom? Use night lights. Make sure that you have a light by your bed that is easy to reach. Do not use any sheets or blankets that are too big for your bed. They should not hang down onto the floor. Have a firm chair that has side arms. You can use this for support while you get dressed. Do not have throw rugs and other things on the floor that can make you trip. What can I do  in the kitchen? Clean up any spills right away. Avoid walking on wet floors. Keep items that you use a lot in easy-to-reach places. If you need to reach something above you, use a strong step stool that has a grab bar. Keep electrical cords out of the way. Do not use floor polish or wax that makes floors slippery. If you must use wax, use non-skid floor wax. Do not have throw rugs and other things on the floor that can make you trip. What can I do with my stairs? Do not leave any items on the stairs. Make sure that there are handrails on both sides of the stairs and use them. Fix handrails that are broken or loose. Make sure that handrails are as long as the stairways. Check any carpeting to make sure that it is firmly attached to the stairs. Fix any carpet that is loose or worn. Avoid having throw rugs at the top or bottom of the stairs. If you do have throw rugs,  attach them to the floor with carpet tape. Make sure that you have a light switch at the top of the stairs and the bottom of the stairs. If you do not have them, ask someone to add them for you. What else can I do to help prevent falls? Wear shoes that: Do not have high heels. Have rubber bottoms. Are comfortable and fit you well. Are closed at the toe. Do not wear sandals. If you use a stepladder: Make sure that it is fully opened. Do not climb a closed stepladder. Make sure that both sides of the stepladder are locked into place. Ask someone to hold it for you, if possible. Clearly mark and make sure that you can see: Any grab bars or handrails. First and last steps. Where the edge of each step is. Use tools that help you move around (mobility aids) if they are needed. These include: Canes. Walkers. Scooters. Crutches. Turn on the lights when you go into a dark area. Replace any light bulbs as soon as they burn out. Set up your furniture so you have a clear path. Avoid moving your furniture around. If any of your floors are uneven, fix them. If there are any pets around you, be aware of where they are. Review your medicines with your doctor. Some medicines can make you feel dizzy. This can increase your chance of falling. Ask your doctor what other things that you can do to help prevent falls. This information is not intended to replace advice given to you by your health care provider. Make sure you discuss any questions you have with your health care provider. Document Released: 01/09/2009 Document Revised: 08/21/2015 Document Reviewed: 04/19/2014 Elsevier Interactive Patient Education  2017 [employer].

## 2022-03-04 NOTE — Progress Notes (Signed)
I connected with  Thomas [name] on 03/04/22 by a audio enabled telemedicine application and verified that I am speaking with the correct person using two identifiers.  Patient Location: Home  Provider Location: Office/Clinic  I discussed the limitations of evaluation and management by telemedicine. The patient expressed understanding and agreed to proceed.   Subjective:   Thomas [name] is a 66 y.o. male who presents for Medicare Annual/Subsequent preventive examination.  Review of Systems     Cardiac Risk Factors include: advanced age (>67men, >20 women);hypertension;dyslipidemia;male gender;diabetes mellitus     Objective:    Today's Vitals   03/04/22 1407  Weight: 214 lb (97.1 kg)   Body mass index is 29.02 kg/m.     03/04/2022    2:13 PM 11/04/2021    7:14 PM 10/28/2021    9:33 AM 10/12/2021    9:13 AM 02/05/2017    4:08 AM 01/21/2016    6:55 AM 01/14/2016   12:08 PM  Advanced Directives  Does Patient Have a Medical Advance Directive? No No No No No No No  Would patient like information on creating a medical advance directive? No - Patient declined  No - Patient declined No - Patient declined  No - patient declined information No - patient declined information    Current Medications (verified) Outpatient Encounter Medications as of 03/04/2022  Medication Sig   Apple Cider Vinegar 500 MG TABS Take 500 mg by mouth at bedtime.   diclofenac sodium (VOLTAREN) 1 % GEL Apply 1 Application topically 4 (four) times daily as needed (pain).   gabapentin (NEURONTIN) 400 MG capsule Take 400 mg by mouth 3 (three) times daily.   Ginger, Zingiber officinalis, (GINGER PO) Take by mouth.   glimepiride (AMARYL) 4 MG tablet Take 1 tablet (4 mg total) by mouth daily with breakfast.   HYDROmorphone (DILAUDID) 2 MG tablet Take 2 mg by mouth 4 (four) times daily as needed for severe pain.   Lancets (ONETOUCH DELICA PLUS LANCET33G) MISC Apply topically 2 (two) times daily.   levothyroxine  (SYNTHROID) 150 MCG tablet Take 1 tablet (150 mcg total) by mouth daily before breakfast.   lisinopril (ZESTRIL) 10 MG tablet TAKE 1 TABLET BY MOUTH TWICE A DAY AS DIRECTED   metFORMIN (GLUCOPHAGE) 1000 MG tablet TAKE 1 TABLET (1,000 MG TOTAL) BY MOUTH TWICE A DAY WITH FOOD   nortriptyline (PAMELOR) 50 MG capsule TAKE 3 CAPSULES BY MOUTH EVERY DAY AT BEDTIME   ONETOUCH VERIO test strip daily.   simvastatin (ZOCOR) 40 MG tablet TAKE 1 TABLET BY MOUTH ONCE DAILY   Turmeric (QC TUMERIC COMPLEX) 500 MG CAPS Take 500 mg by mouth at bedtime.   No facility-administered encounter medications on file as of 03/04/2022.    Allergies (verified) Trulicity [dulaglutide], Januvia [sitagliptin], Other, and Penicillins   History: Past Medical History:  Diagnosis Date   Aortic stenosis    Cervical myelopathy (HCC)    Diabetes mellitus without complication (HCC)    Hypercholesterolemia    Hypertension    Stroke [facility]) 1982   pt states he had a mini stroke in 1982   Past Surgical History:  Procedure Laterality Date   ANTERIOR CERVICAL DECOMP/DISCECTOMY FUSION N/A 10/28/2021   Procedure: ACDF - C3-C4 - C4-C5 - C5-C6;  Surgeon: [name], MD;  Location: [facility] OR;  Service: Neurosurgery;  Laterality: N/A;   BACK SURGERY     HERNIA REPAIR     Family History  Problem Relation Age of Onset   Diabetes Mother  Heart attack Mother    Hypertension Mother    Arthritis Mother    Skin cancer Father    Arthritis Father    Breast cancer Sister    Social History   Socioeconomic History   Marital status: Married    Spouse name: Not on file   Number of children: 3   Years of education: Not on file   Highest education level: Not on file  Occupational History   Occupation: Retired from Designer, fashion/clothingtextiles, also drove a dump truck  Tobacco Use   Smoking status: Never   Smokeless tobacco: Never  Building services engineerVaping Use   Vaping Use: Never used  Substance and Sexual Activity   Alcohol use: Never    Comment: occ beer    Drug  use: No   Sexual activity: Not on file  Other Topics Concern   Not on file  Social History Narrative   Not on file   Social Determinants of Health   Financial Resource Strain: Low Risk  (03/04/2022)   Overall Financial Resource Strain (CARDIA)    Difficulty of Paying Living Expenses: Not hard at all  Food Insecurity: No Food Insecurity (03/04/2022)   Hunger Vital Sign    Worried About Running Out of Food in the Last Year: Never true    Ran Out of Food in the Last Year: Never true  Transportation Needs: No Transportation Needs (03/04/2022)   PRAPARE - Administrator, Civil ServiceTransportation    Lack of Transportation (Medical): No    Lack of Transportation (Non-Medical): No  Physical Activity: Insufficiently Active (03/04/2022)   Exercise Vital Sign    Days of Exercise per Week: 7 days    Minutes of Exercise per Session: 20 min  Stress: No Stress Concern Present (03/04/2022)   [employer] - Occupational Stress Questionnaire    Feeling of Stress : Not at all  Social Connections: Moderately Isolated (03/04/2022)   Social Connection and Isolation Panel [NHANES]    Frequency of Communication with Friends and Family: Once a week    Frequency of Social Gatherings with Friends and Family: More than three times a week    Attends Religious Services: Never    Database administratorActive Member of Clubs or Organizations: No    Attends Engineer, structuralClub or Organization Meetings: Never    Marital Status: Married    Tobacco Counseling Counseling given: Not Answered   Clinical Intake:  Pre-visit preparation completed: Yes  Pain : No/denies pain     BMI - recorded: 29.02 Nutritional Status: BMI 25 -29 Overweight Nutritional Risks: None Diabetes: Yes CBG done?: Yes (82 per pt) CBG resulted in Enter/ Edit results?: No Did pt. bring in CBG monitor from home?: No  How often do you need to have someone help you when you read instructions, pamphlets, or other written materials from your doctor or pharmacy?: 1 -  Never  Diabetic?Nutrition Risk Assessment:  Has the patient had any N/V/D within the last 2 months?  No  Does the patient have any non-healing wounds?  No  Has the patient had any unintentional weight loss or weight gain?  No   Diabetes:  Is the patient diabetic?  Yes  If diabetic, was a CBG obtained today?  Yes  Did the patient bring in their glucometer from home?  No  How often do you monitor your CBG's? Daily .   Financial Strains and Diabetes Management:  Are you having any financial strains with the device, your supplies or your medication? No .  Does the patient  want to be seen by Chronic Care Management for management of their diabetes?  No  Would the patient like to be referred to a Nutritionist or for Diabetic Management?  No   Diabetic Exams:  Diabetic Eye Exam: Overdue for diabetic eye exam. Pt has been advised about the importance in completing this exam. Patient advised to call and schedule an eye exam. Diabetic Foot Exam: Overdue, Pt has been advised about the importance in completing this exam. Pt is scheduled for diabetic foot exam on next appt .   Interpreter Needed?: No  Information entered by :: Lanier Ensign, LPN   Activities of Daily Living    03/04/2022    2:13 PM 10/12/2021    9:15 AM  In your present state of health, do you have any difficulty performing the following activities:  Hearing? 0   Vision? 0   Difficulty concentrating or making decisions? 0   Walking or climbing stairs? 0   Dressing or bathing? 0   Doing errands, shopping? 0 0  Preparing Food and eating ? N   Using the Toilet? N   In the past six months, have you accidently leaked urine? N   Do you have problems with loss of bowel control? N   Managing your Medications? N   Managing your Finances? N   Housekeeping or managing your Housekeeping? N     Patient Care Team: Ardith Dark, MD as PCP - General (Family Medicine) Nahser, Deloris Ping, MD as PCP - Cardiology  (Cardiology)  Indicate any recent Medical Services you may have received from other than [facility] providers in the past year (date may be approximate).     Assessment:   This is a routine wellness examination for [place].  Hearing/Vision screen Hearing Screening - Comments:: Pt denies any hearing issues  Vision Screening - Comments:: Pt follows up with [facility] in [place]   Dietary issues and exercise activities discussed: Current Exercise Habits: Home exercise routine, Type of exercise: walking, Time (Minutes): 20, Frequency (Times/Week): 5, Weekly Exercise (Minutes/Week): 100   Goals Addressed             This Visit's Progress    Patient Stated       Stay healthy        Depression Screen    03/04/2022    2:11 PM 01/06/2022    7:47 AM 10/06/2021    8:05 AM 04/13/2021    8:42 AM 01/15/2021    8:10 AM 10/09/2020    8:00 AM 10/09/2019    8:07 AM  PHQ 2/9 Scores  PHQ - 2 Score 0 0 0 0 0 0 0    Fall Risk    03/04/2022    2:13 PM 01/06/2022    7:48 AM 10/06/2021    8:05 AM 04/13/2021    8:42 AM 01/15/2021    8:10 AM  Fall Risk   Falls in the past year? 0 0 0 0 0  Number falls in past yr: 0 0 0 0 0  Injury with Fall? 0 0 0 0 0  Risk for fall due to : Impaired vision No Fall Risks No Fall Risks No Fall Risks   Follow up Falls prevention discussed        FALL RISK PREVENTION PERTAINING TO THE HOME:  Any stairs in or around the home? No  If so, are there any without handrails? No  Home free of loose throw rugs in walkways, pet beds, electrical cords, etc? Yes  Adequate  lighting in your home to reduce risk of falls? Yes   ASSISTIVE DEVICES UTILIZED TO PREVENT FALLS:  Life alert? No  Use of a cane, walker or w/c? Yes  Grab bars in the bathroom? Yes  Shower chair or bench in shower? Yes  Elevated toilet seat or a handicapped toilet? No   TIMED UP AND GO:  Was the test performed? No .   Cognitive Function:        03/04/2022    2:14 PM  6CIT Screen  What Year? 0  points  What month? 0 points  What time? 0 points  Count back from 20 0 points  Months in reverse 4 points  Repeat phrase 6 points  Total Score 10 points    Immunizations Immunization History  Administered Date(s) Administered   Fluad Quad(high Dose 65+) 01/02/2020, 01/21/2022   Influenza, High Dose Seasonal PF 01/24/2021   Influenza,inj,Quad PF,6+ Mos 01/15/2016, 01/11/2017, 12/31/2017, 12/13/2018   Influenza-Unspecified 12/31/2017   PFIZER(Purple Top)SARS-COV-2 Vaccination 12/11/2019, 01/02/2020   Pfizer Covid-19 Vaccine Bivalent Booster 47yrs & up 02/12/2021   Pneumococcal Conjugate-13 10/09/2019   Pneumococcal Polysaccharide-23 01/15/2016    TDAP status: Due, Education has been provided regarding the importance of this vaccine. Advised may receive this vaccine at local pharmacy or Health Dept. Aware to provide a copy of the vaccination record if obtained from local pharmacy or Health Dept. Verbalized acceptance and understanding.  Flu Vaccine status: Up to date  Pneumococcal vaccine status: Up to date  Covid-19 vaccine status: Completed vaccines  Qualifies for Shingles Vaccine? Yes   Zostavax completed No   Shingrix Completed?: No.    Education has been provided regarding the importance of this vaccine. Patient has been advised to call insurance company to determine out of pocket expense if they have not yet received this vaccine. Advised may also receive vaccine at local pharmacy or Health Dept. Verbalized acceptance and understanding.  Screening Tests Health Maintenance  Topic Date Due   DTaP/Tdap/Td (1 - Tdap) Never done   Diabetic kidney evaluation - Urine ACR  07/21/2018   OPHTHALMOLOGY EXAM  02/15/2021   FOOT EXAM  04/10/2021   COVID-19 Vaccine (4 - 2023-24 season) 11/27/2021   Zoster Vaccines- Shingrix (1 of 2) 04/08/2022 (Originally 06/20/2004)   Pneumonia Vaccine 44+ Years old (3 - PPSV23 or PCV20) 01/07/2023 (Originally 01/14/2021)   COLONOSCOPY (Pts 45-49yrs  Insurance coverage will need to be confirmed)  01/07/2023 (Originally 06/21/1999)   HEMOGLOBIN A1C  07/08/2022   Diabetic kidney evaluation - GFR measurement  11/05/2022   Medicare Annual Wellness (AWV)  03/05/2023   INFLUENZA VACCINE  Completed   Hepatitis C Screening  Completed   HPV VACCINES  Aged Out    Health Maintenance  Health Maintenance Due  Topic Date Due   DTaP/Tdap/Td (1 - Tdap) Never done   Diabetic kidney evaluation - Urine ACR  07/21/2018   OPHTHALMOLOGY EXAM  02/15/2021   FOOT EXAM  04/10/2021   COVID-19 Vaccine (4 - 2023-24 season) 11/27/2021    Colonoscopy postponed per pt    Additional Screening:  Hepatitis C Screening:  Completed 10/03/17  Vision Screening: Recommended annual ophthalmology exams for early detection of glaucoma and other disorders of the eye. Is the patient up to date with their annual eye exam?  No  Who is the provider or what is the name of the office in which the patient attends annual eye exams? [facility]  If pt is not established with a provider, would they like to  be referred to a provider to establish care? No .   Dental Screening: Recommended annual dental exams for proper oral hygiene  Community Resource Referral / Chronic Care Management: CRR required this visit?  No   CCM required this visit?  No      Plan:     I have personally reviewed and noted the following in the patient's chart:   Medical and social history Use of alcohol, tobacco or illicit drugs  Current medications and supplements including opioid prescriptions. Patient is currently taking opioid prescriptions. Information provided to patient regarding non-opioid alternatives. Patient advised to discuss non-opioid treatment plan with their provider. Functional ability and status Nutritional status Physical activity Advanced directives List of other physicians Hospitalizations, surgeries, and ER visits in previous 12 months Vitals Screenings to include  cognitive, depression, and falls Referrals and appointments  In addition, I have reviewed and discussed with patient certain preventive protocols, quality metrics, and best practice recommendations. A written personalized care plan for preventive services as well as general preventive health recommendations were provided to patient.     Marzella Schlein, LPN   [id number]/04/[id number]   Nurse Notes: none

## 2022-03-15 ENCOUNTER — Other Ambulatory Visit: Payer: Self-pay | Admitting: Family Medicine

## 2022-03-17 ENCOUNTER — Other Ambulatory Visit: Payer: Self-pay | Admitting: Family Medicine

## 2022-04-01 ENCOUNTER — Other Ambulatory Visit: Payer: Self-pay | Admitting: Family Medicine

## 2022-04-15 DIAGNOSIS — M542 Cervicalgia: Secondary | ICD-10-CM | POA: Diagnosis not present

## 2022-04-26 ENCOUNTER — Ambulatory Visit: Payer: Medicare Other | Attending: Cardiovascular Disease | Admitting: Cardiovascular Disease

## 2022-04-26 ENCOUNTER — Other Ambulatory Visit: Payer: Self-pay

## 2022-04-26 ENCOUNTER — Encounter: Payer: Self-pay | Admitting: Cardiovascular Disease

## 2022-04-26 ENCOUNTER — Ambulatory Visit (HOSPITAL_BASED_OUTPATIENT_CLINIC_OR_DEPARTMENT_OTHER): Payer: Medicare Other

## 2022-04-26 ENCOUNTER — Emergency Department (HOSPITAL_COMMUNITY): Payer: Medicare Other

## 2022-04-26 ENCOUNTER — Inpatient Hospital Stay (HOSPITAL_COMMUNITY)
Admission: EM | Admit: 2022-04-26 | Discharge: 2022-04-28 | DRG: 286 | Disposition: A | Discharge status: Home / Self Care | Payer: Medicare Other | Source: Ambulatory Visit | Attending: Cardiovascular Disease | Admitting: Cardiovascular Disease

## 2022-04-26 ENCOUNTER — Encounter (HOSPITAL_COMMUNITY): Payer: Self-pay | Admitting: *Deleted

## 2022-04-26 VITALS — BP 101/73 | HR 132 | Ht 72.0 in | Wt 222.4 lb

## 2022-04-26 DIAGNOSIS — I5043 Acute on chronic combined systolic (congestive) and diastolic (congestive) heart failure: Secondary | ICD-10-CM

## 2022-04-26 DIAGNOSIS — E7800 Pure hypercholesterolemia, unspecified: Secondary | ICD-10-CM | POA: Diagnosis not present

## 2022-04-26 DIAGNOSIS — Z8673 Personal history of transient ischemic attack (TIA), and cerebral infarction without residual deficits: Secondary | ICD-10-CM

## 2022-04-26 DIAGNOSIS — Z8249 Family history of ischemic heart disease and other diseases of the circulatory system: Secondary | ICD-10-CM | POA: Diagnosis not present

## 2022-04-26 DIAGNOSIS — N189 Chronic kidney disease, unspecified: Secondary | ICD-10-CM | POA: Diagnosis not present

## 2022-04-26 DIAGNOSIS — I083 Combined rheumatic disorders of mitral, aortic and tricuspid valves: Secondary | ICD-10-CM | POA: Diagnosis not present

## 2022-04-26 DIAGNOSIS — Z8261 Family history of arthritis: Secondary | ICD-10-CM

## 2022-04-26 DIAGNOSIS — I350 Nonrheumatic aortic (valve) stenosis: Secondary | ICD-10-CM

## 2022-04-26 DIAGNOSIS — E1122 Type 2 diabetes mellitus with diabetic chronic kidney disease: Secondary | ICD-10-CM | POA: Diagnosis present

## 2022-04-26 DIAGNOSIS — Z833 Family history of diabetes mellitus: Secondary | ICD-10-CM | POA: Diagnosis not present

## 2022-04-26 DIAGNOSIS — I130 Hypertensive heart and chronic kidney disease with heart failure and stage 1 through stage 4 chronic kidney disease, or unspecified chronic kidney disease: Secondary | ICD-10-CM | POA: Diagnosis not present

## 2022-04-26 DIAGNOSIS — I429 Cardiomyopathy, unspecified: Secondary | ICD-10-CM | POA: Diagnosis not present

## 2022-04-26 DIAGNOSIS — Z7984 Long term (current) use of oral hypoglycemic drugs: Secondary | ICD-10-CM | POA: Diagnosis not present

## 2022-04-26 DIAGNOSIS — I080 Rheumatic disorders of both mitral and aortic valves: Secondary | ICD-10-CM | POA: Diagnosis not present

## 2022-04-26 DIAGNOSIS — I77810 Thoracic aortic ectasia: Secondary | ICD-10-CM | POA: Diagnosis not present

## 2022-04-26 DIAGNOSIS — J9811 Atelectasis: Secondary | ICD-10-CM | POA: Diagnosis not present

## 2022-04-26 DIAGNOSIS — J811 Chronic pulmonary edema: Secondary | ICD-10-CM | POA: Diagnosis not present

## 2022-04-26 DIAGNOSIS — I340 Nonrheumatic mitral (valve) insufficiency: Secondary | ICD-10-CM | POA: Diagnosis not present

## 2022-04-26 DIAGNOSIS — I484 Atypical atrial flutter: Secondary | ICD-10-CM

## 2022-04-26 DIAGNOSIS — N1831 Chronic kidney disease, stage 3a: Secondary | ICD-10-CM | POA: Diagnosis present

## 2022-04-26 DIAGNOSIS — I959 Hypotension, unspecified: Secondary | ICD-10-CM | POA: Diagnosis not present

## 2022-04-26 DIAGNOSIS — I4891 Unspecified atrial fibrillation: Secondary | ICD-10-CM | POA: Diagnosis present

## 2022-04-26 DIAGNOSIS — R200 Anesthesia of skin: Secondary | ICD-10-CM | POA: Diagnosis present

## 2022-04-26 DIAGNOSIS — I447 Left bundle-branch block, unspecified: Secondary | ICD-10-CM | POA: Diagnosis present

## 2022-04-26 DIAGNOSIS — Z803 Family history of malignant neoplasm of breast: Secondary | ICD-10-CM | POA: Diagnosis not present

## 2022-04-26 DIAGNOSIS — Z808 Family history of malignant neoplasm of other organs or systems: Secondary | ICD-10-CM

## 2022-04-26 DIAGNOSIS — R000 Tachycardia, unspecified: Secondary | ICD-10-CM | POA: Diagnosis present

## 2022-04-26 DIAGNOSIS — I2510 Atherosclerotic heart disease of native coronary artery without angina pectoris: Secondary | ICD-10-CM | POA: Diagnosis present

## 2022-04-26 DIAGNOSIS — E039 Hypothyroidism, unspecified: Secondary | ICD-10-CM | POA: Diagnosis present

## 2022-04-26 DIAGNOSIS — I272 Pulmonary hypertension, unspecified: Secondary | ICD-10-CM | POA: Diagnosis not present

## 2022-04-26 DIAGNOSIS — R002 Palpitations: Secondary | ICD-10-CM | POA: Diagnosis not present

## 2022-04-26 DIAGNOSIS — I4430 Unspecified atrioventricular block: Secondary | ICD-10-CM | POA: Diagnosis not present

## 2022-04-26 DIAGNOSIS — I4892 Unspecified atrial flutter: Principal | ICD-10-CM | POA: Diagnosis present

## 2022-04-26 DIAGNOSIS — I129 Hypertensive chronic kidney disease with stage 1 through stage 4 chronic kidney disease, or unspecified chronic kidney disease: Secondary | ICD-10-CM | POA: Diagnosis not present

## 2022-04-26 DIAGNOSIS — I483 Typical atrial flutter: Secondary | ICD-10-CM | POA: Diagnosis not present

## 2022-04-26 DIAGNOSIS — N183 Chronic kidney disease, stage 3 unspecified: Secondary | ICD-10-CM | POA: Diagnosis not present

## 2022-04-26 DIAGNOSIS — I708 Atherosclerosis of other arteries: Secondary | ICD-10-CM | POA: Diagnosis not present

## 2022-04-26 LAB — CBC
HCT: 44.9 % (ref 39.0–52.0)
Hemoglobin: 14.8 g/dL (ref 13.0–17.0)
MCH: 30.6 pg (ref 26.0–34.0)
MCHC: 33 g/dL (ref 30.0–36.0)
MCV: 92.8 fL (ref 80.0–100.0)
Platelets: 255 10*3/uL (ref 150–400)
RBC: 4.84 MIL/uL (ref 4.22–5.81)
RDW: 12.3 % (ref 11.5–15.5)
WBC: 9.1 10*3/uL (ref 4.0–10.5)
nRBC: 0 % (ref 0.0–0.2)

## 2022-04-26 LAB — ECHOCARDIOGRAM COMPLETE
AR max vel: 0.86 cm2
AV Area VTI: 0.93 cm2
AV Area mean vel: 0.81 cm2
AV Mean grad: 24 mmHg
AV Peak grad: 39.2 mmHg
Ao pk vel: 3.13 m/s
Area-P 1/2: 5.16 cm2
P 1/2 time: 198 msec
S' Lateral: 4 cm

## 2022-04-26 LAB — BASIC METABOLIC PANEL
Anion gap: 13 (ref 5–15)
BUN: 19 mg/dL (ref 8–23)
CO2: 23 mmol/L (ref 22–32)
Calcium: 9.2 mg/dL (ref 8.9–10.3)
Chloride: 102 mmol/L (ref 98–111)
Creatinine, Ser: 1.51 mg/dL — ABNORMAL HIGH (ref 0.61–1.24)
GFR, Estimated: 50 mL/min — ABNORMAL LOW (ref >60)
Glucose, Bld: 87 mg/dL (ref 70–99)
Potassium: 4.4 mmol/L (ref 3.5–5.1)
Sodium: 138 mmol/L (ref 135–145)

## 2022-04-26 LAB — TSH: TSH: 0.504 u[IU]/mL (ref 0.350–4.500)

## 2022-04-26 LAB — MAGNESIUM: Magnesium: 1.4 mg/dL — ABNORMAL LOW (ref 1.7–2.4)

## 2022-04-26 LAB — CBG MONITORING, ED: Glucose-Capillary: 86 mg/dL (ref 70–99)

## 2022-04-26 MED ORDER — MAGNESIUM SULFATE 2 GM/50ML IV SOLN
2.0000 g | Freq: Once | INTRAVENOUS | Status: AC
  Administered 2022-04-26: 2 g via INTRAVENOUS
  Filled 2022-04-26: qty 50

## 2022-04-26 MED ORDER — LEVOTHYROXINE SODIUM 75 MCG PO TABS
150.0000 ug | ORAL_TABLET | Freq: Every day | ORAL | Status: DC
  Administered 2022-04-27: 150 ug via ORAL
  Filled 2022-04-26 (×2): qty 2

## 2022-04-26 MED ORDER — AMIODARONE HCL IN DEXTROSE 360-4.14 MG/200ML-% IV SOLN
60.0000 mg/h | INTRAVENOUS | Status: AC
  Administered 2022-04-26 (×2): 60 mg/h via INTRAVENOUS
  Filled 2022-04-26: qty 200

## 2022-04-26 MED ORDER — AMIODARONE HCL IN DEXTROSE 360-4.14 MG/200ML-% IV SOLN
30.0000 mg/h | INTRAVENOUS | Status: AC
  Administered 2022-04-26 – 2022-04-27 (×2): 30 mg/h via INTRAVENOUS
  Filled 2022-04-26 (×3): qty 200

## 2022-04-26 MED ORDER — AMIODARONE LOAD VIA INFUSION
150.0000 mg | Freq: Once | INTRAVENOUS | Status: AC
  Administered 2022-04-26: 150 mg via INTRAVENOUS
  Filled 2022-04-26: qty 83.34

## 2022-04-26 MED ORDER — FUROSEMIDE 10 MG/ML IJ SOLN
40.0000 mg | Freq: Once | INTRAMUSCULAR | Status: AC
  Administered 2022-04-26: 40 mg via INTRAVENOUS
  Filled 2022-04-26: qty 4

## 2022-04-26 MED ORDER — SIMVASTATIN 20 MG PO TABS
40.0000 mg | ORAL_TABLET | Freq: Every day | ORAL | Status: DC
  Filled 2022-04-26: qty 2

## 2022-04-26 MED ORDER — ONDANSETRON HCL 4 MG/2ML IJ SOLN: 4.0000 mg | Freq: Four times a day (QID) | INTRAMUSCULAR | Status: DC | PRN

## 2022-04-26 MED ORDER — SIMVASTATIN 20 MG PO TABS
40.0000 mg | ORAL_TABLET | Freq: Every day | ORAL | Status: DC
  Administered 2022-04-26 – 2022-04-27 (×2): 40 mg via ORAL
  Filled 2022-04-26 (×2): qty 2

## 2022-04-26 MED ORDER — HEPARIN BOLUS VIA INFUSION
5000.0000 [IU] | Freq: Once | INTRAVENOUS | Status: AC
  Administered 2022-04-26: 5000 [IU] via INTRAVENOUS
  Filled 2022-04-26: qty 5000

## 2022-04-26 MED ORDER — INSULIN ASPART 100 UNIT/ML IJ SOLN
0.0000 [IU] | Freq: Three times a day (TID) | INTRAMUSCULAR | Status: DC
  Administered 2022-04-27 (×2): 2 [IU] via SUBCUTANEOUS
  Administered 2022-04-28: 3 [IU] via SUBCUTANEOUS

## 2022-04-26 MED ORDER — HEPARIN (PORCINE) 25000 UT/250ML-% IV SOLN
1500.0000 [IU]/h | INTRAVENOUS | Status: DC
  Administered 2022-04-26 – 2022-04-27 (×2): 1400 [IU]/h via INTRAVENOUS
  Filled 2022-04-26 (×2): qty 250

## 2022-04-26 MED ORDER — ACETAMINOPHEN 325 MG PO TABS: 650.0000 mg | ORAL_TABLET | ORAL | Status: DC | PRN

## 2022-04-26 MED ORDER — GABAPENTIN 400 MG PO CAPS
400.0000 mg | ORAL_CAPSULE | Freq: Three times a day (TID) | ORAL | Status: DC
  Administered 2022-04-26 – 2022-04-28 (×7): 400 mg via ORAL
  Filled 2022-04-26 (×8): qty 1

## 2022-04-26 NOTE — Progress Notes (Signed)
ANTICOAGULATION CONSULT NOTE - Initial Consult  Pharmacy Consult for heparin  Indication:  atrial flutter  Allergies  Allergen Reactions   Trulicity [Dulaglutide] Hives   Januvia [Sitagliptin] Other (See Comments)    Red eyes   Other     Stomach medicine, not sure of name. Had a mini stroke   Penicillins Swelling    Arm swelling Has patient had a PCN reaction causing immediate rash, facial/tongue/throat swelling, SOB or lightheadedness with hypotension: No Has patient had a PCN reaction causing severe rash involving mucus membranes or skin necrosis: No Has patient had a PCN reaction that required hospitalization No Has patient had a PCN reaction occurring within the last 10 years: No If all of the above answers are "NO", then may proceed with Cephalosporin use.      Vital Signs: Temp: 98.5 F (36.9 C) (01/29 1534) Temp Source: Oral (01/29 1230) BP: 107/91 (01/29 1700) Pulse Rate: 129 (01/29 1700)  Labs: Recent Labs    04/26/22 1238  HGB 14.8  HCT 44.9  PLT 255  CREATININE 1.51*    Estimated Creatinine Clearance: 58.3 mL/min (A) (by C-G formula based on SCr of 1.51 mg/dL (H)).   Medical History: Past Medical History:  Diagnosis Date   Aortic stenosis    Cervical myelopathy (HCC)    Diabetes mellitus without complication ([place])    Hypercholesterolemia    Hypertension    Stroke [facility]) 1982   pt states he had a mini stroke in 1982    Assessment: Patient admitted to the ED after found to be in afib with RVR at cardiologist. No AC PTA. Pharmacy consulted to dose heparin.   Tentative plan for TEE/DCCV on 1/31. Cardiology may plan to do heart cath for TAVR workup prior to starting oral anticoagulation. Hgb 14.8 and plt 255.   Goal of Therapy:  Heparin level 0.3-0.7 units/ml Monitor platelets by anticoagulation protocol: Yes   Plan:  Give heparin 5000 units IV x1  Start heparin infusion at 1400 units/hr  Will f/u heparin level/aPTT in 6 hours Monitor daily  heparin level and CBC Continue to monitor H&H   F/u transition to [place], PharmD PGY1 Pharmacy Resident   04/26/2022 6:50 PM

## 2022-04-26 NOTE — ED Provider Triage Note (Signed)
Emergency Medicine Provider Triage Evaluation Note  Thomas [name] , a 67 y.o. male  was evaluated in triage.  Pt complains of he was at his cardiologist's office today who was sent to the emergency department for evaluation of new onset atrial fibrillation.  Patient is asymptomatic.  He is not on any blood thinners..  Review of Systems  Positive: tachycardia Negative: sob  Physical Exam  BP (!) 152/132   Pulse (!) 132   Temp 98.3 F (36.8 C) (Oral)   Resp 17   SpO2 100%  Gen:   Awake, no distress   Resp:  Normal effort  MSK:   Moves extremities without difficulty  Other:  Rapid regular heart rate  Medical Decision Making  Medically screening exam initiated at 12:31 PM.  Appropriate orders placed.  Thomas [name] was informed that the remainder of the evaluation will be completed by another provider, this initial triage assessment does not replace that evaluation, and the importance of remaining in the ED until their evaluation is complete.  tachycardia   Margarita Mail, PA-C 04/26/22 1235

## 2022-04-26 NOTE — ED Triage Notes (Signed)
Patient was seen at his cardiologist today for follow up and found to be in afib rvr.  He has hx of heart murmur.  Patient denies MI.  He denies any chest pain, denies any shortness of breath

## 2022-04-26 NOTE — ED Provider Notes (Signed)
[facility] Provider Note   CSN: [id number] Arrival date & time: 04/26/22  1053     History Chief Complaint  Patient presents with   Tachycardia    Afib at MD office    HPI Thomas [name] is a 67 y.o. male presenting for chief complaint of palpitations from Dr. Elmarie [name] office.  Patient found to be in a tacky dysrhythmia in clinic this afternoon.  He has an extensive history including diabetes hypertension hyperlipidemia, CVA.  Was being evaluated related to his aortic stenosis today when he was found to be tachycardic to the 130s in clinic.  With carotid sinus massage a flutter was demonstrated.  He endorses dyspnea on exertion. Cardiology was concern for heart failure exacerbation due to RVR and recommend he come to the hospital for cardioversion, rate control. He is not on anticoagulation. EF 40-45% on same day echo. Patient's recorded medical, surgical, social, medication list and allergies were reviewed in the Snapshot window as part of the initial history.   Review of Systems   Review of Systems  Constitutional:  Negative for chills and fever.  HENT:  Negative for ear pain and sore throat.   Eyes:  Negative for pain and visual disturbance.  Respiratory:  Negative for cough and shortness of breath.   Cardiovascular:  Positive for palpitations. Negative for chest pain.  Gastrointestinal:  Negative for abdominal pain and vomiting.  Genitourinary:  Negative for dysuria and hematuria.  Musculoskeletal:  Negative for arthralgias and back pain.  Skin:  Negative for color change and rash.  Neurological:  Negative for seizures and syncope.  All other systems reviewed and are negative.   Physical Exam Updated Vital Signs BP 104/73 (BP Location: Left Arm)   Pulse (!) 135   Temp 98.5 F (36.9 C)   Resp 17   SpO2 100%  Physical Exam Vitals and nursing note reviewed.  Constitutional:      General: He is not in acute distress.     Appearance: He is well-developed.  HENT:     Head: Normocephalic and atraumatic.  Eyes:     Conjunctiva/sclera: Conjunctivae normal.  Cardiovascular:     Rate and Rhythm: Regular rhythm. Tachycardia present.     Heart sounds: No murmur heard. Pulmonary:     Effort: Pulmonary effort is normal. No respiratory distress.     Breath sounds: Normal breath sounds.  Abdominal:     Palpations: Abdomen is soft.     Tenderness: There is no abdominal tenderness.  Musculoskeletal:        General: No swelling.     Cervical back: Neck supple.  Skin:    General: Skin is warm and dry.     Capillary Refill: Capillary refill takes less than 2 seconds.  Neurological:     Mental Status: He is alert.  Psychiatric:        Mood and Affect: Mood normal.      ED Course/ Medical Decision Making/ A&P    Procedures Ultrasound ED Echo  Date/Time: 04/26/2022 4:37 PM  Performed by: Thomas Sciara, MD Authorized by: Thomas Sciara, MD   Procedure details:    Indications comment:  Tachydysrhythmia   Views: subxiphoid, parasternal long axis view, parasternal short axis view, apical 4 chamber view and IVC view     Images: archived   Findings:    Pericardium: no pericardial effusion     LV Function: severly depressed (<30%)     IVC: dilated  Impression:    Impression: normal and decreased contractility      Medications Ordered in ED Medications - No data to display  Medical Decision Making:    Thomas [name] is a 67 y.o. male who presented to the ED today with tachycardia in clinic detailed above.     Additional history discussed with patient's family/caregivers.  Patient's presentation is complicated by their history of multiple comorbid medical conditions.  Patient placed on continuous vitals and telemetry monitoring while in ED which was reviewed periodically.   Complete initial physical exam performed, notably the patient  was hemodynamically stable in no acute distress.  Grossly  tachycardic.      Reviewed and confirmed nursing documentation for past medical history, family history, social history.    Initial Assessment:   Patient with tachycardia, palpitations, dyspnea on exertion.  Point-of-care echocardiogram was performed as above demonstrating a reduced ejection fraction and fully distended IVC. Differential is broad and includes heart failure exacerbation, new tachydysrhythmia, ACS, PE.  Heart failure exacerbation with resulting dysrhythmia seems most likely. Prior to ED interventions, cardiology consulted due to patient's arrival from cardiology clinic. Cardiology team recommended admission for further care and management.  They assumed care immediately and placed admission orders for further care and management.  Disposition:   Based on the above findings, I believe this patient is stable for admission.    Patient/family educated about specific findings on our evaluation and explained exact reasons for admission.  Patient/family educated about clinical situation and time was allowed to answer questions.   Admission team communicated with and agreed with need for admission. Patient admitted. Patient ready to move at this time.     Emergency Department Medication Summary:   Medications  levothyroxine (SYNTHROID) tablet 150 mcg (has no administration in time range)  gabapentin (NEURONTIN) capsule 400 mg (400 mg Oral Given 04/26/22 1758)  acetaminophen (TYLENOL) tablet 650 mg (has no administration in time range)  ondansetron (ZOFRAN) injection 4 mg (has no administration in time range)  amiodarone (NEXTERONE) 1.8 mg/mL load via infusion 150 mg (150 mg Intravenous Bolus from Bag 04/26/22 1730)    Followed by  amiodarone (NEXTERONE PREMIX) 360-4.14 MG/200ML-% (1.8 mg/mL) IV infusion (60 mg/hr Intravenous New Bag/Given 04/26/22 1729)    Followed by  amiodarone (NEXTERONE PREMIX) 360-4.14 MG/200ML-% (1.8 mg/mL) IV infusion (has no administration in time range)   insulin aspart (novoLOG) injection 0-15 Units (has no administration in time range)  heparin ADULT infusion 100 units/mL (25000 units/262mL) (1,400 Units/hr Intravenous New Bag/Given 04/26/22 1756)  simvastatin (ZOCOR) tablet 40 mg (has no administration in time range)  magnesium sulfate IVPB 2 g 50 mL (0 g Intravenous Stopped 04/26/22 1737)  furosemide (LASIX) injection 40 mg (40 mg Intravenous Given 04/26/22 1737)  heparin bolus via infusion 5,000 Units (5,000 Units Intravenous Bolus from Bag 04/26/22 1757)         Clinical Impression: No diagnosis found.   Data Unavailable   Final Clinical Impression(s) / ED Diagnoses Final diagnoses:  None    Rx / DC Orders ED Discharge Orders     None         Thomas Sciara, MD 04/26/22 1821

## 2022-04-26 NOTE — Addendum Note (Signed)
Addended by: Thayer Headings on: 04/26/2022 05:24 PM   Modules accepted: Level of Service

## 2022-04-26 NOTE — Progress Notes (Signed)
Cardiology Office Note:    Date:  04/26/2022   ID:  Thomas [name], DOB [birth year], MRN [id number]  PCP:  Ardith Dark, MD   [facility] Providers Cardiologist:  Lauraann Missey   Referring MD: Ardith Dark, MD   Chief Complaint  Patient presents with   Shortness of Breath   Leg Swelling   Congestive Heart Failure          History of Present Illness:    Thomas [name] is a 67 y.o. male with a hx of DM, HLD, HTN, stroke. He was recently scheduled to have some neck  surgery with Dr. Wynetta Emery.  He had an echocardiogram prior to his scheduled back surgery and he was found to have severe aortic stenosis.  Has occasional arm numbness / weakness.   Scheduled him for a working visit to discuss his aortic stenosis. His daughter is Sheppard Coil ( our Engineer, site )   Seen with wife, Kendal Hymen.  Has been told he has a murmur years ago.  Has not had any cardiac issues related to his AS No cp, dyspnea, syncope, no presyncope  Is not limited from a cardiology standpoint Able to do yard work without any CP or dyspnea   Walks with a cane because of his R leg nerve / back issues.   Occasional palpitations that last for 1-2 seconds .      Avoids salt, Eats a fairly healthy diet Worked in [employer], and [employer] )  Drove a dump truck,  has been a Curator   I have discussed the case with Dr. Wynetta Emery. Thomas [name] is myelopathic - has ongoing  compression of his spinal cord.  The case would be about 2.5 hours. Anticipated blood loss would be minimal .    Oct. 24, 2023  Thomas [name] is seen for follow up of his AS, recent neck surgery  His numbness in his arms is better  Still has lowe back issues with numbness in his R leg - no plans for surgery .   Jan. 29, 2024  Thomas [name] is seen today for leg swelling , worsening chest pain  Was found to have atrial flutter on ECG today   Avoids salt   Not much exercise  Not inclined   Performed CSM today ,   was able to demonstrate A-flutter waves - was not able to record on paper   Some shortness of breath .  Not necessarliy worse than usual  Wt is 222 lbs ( up 4 lbs from last visit in Oct. )   No CP .   Can feel the heart fluttering on occasion        Past Medical History:  Diagnosis Date   Aortic stenosis    Cervical myelopathy (HCC)    Diabetes mellitus without complication (HCC)    Hypercholesterolemia    Hypertension    Stroke [facility]) 1982   pt states he had a mini stroke in 1982    Past Surgical History:  Procedure Laterality Date   ANTERIOR CERVICAL DECOMP/DISCECTOMY FUSION N/A 10/28/2021   Procedure: ACDF - C3-C4 - C4-C5 - C5-C6;  Surgeon: [name], MD;  Location: [facility] OR;  Service: Neurosurgery;  Laterality: N/A;   BACK SURGERY     HERNIA REPAIR      Current Medications: Current Meds  Medication Sig   Apple Cider Vinegar 500 MG TABS Take 500 mg by mouth at bedtime.   diclofenac sodium (VOLTAREN) 1 % GEL  Apply 1 Application topically 4 (four) times daily as needed (pain).   FLUZONE HIGH-DOSE QUADRIVALENT 0.7 ML SUSY Inject 0.5 mLs into the muscle once.   gabapentin (NEURONTIN) 400 MG capsule Take 400 mg by mouth 3 (three) times daily.   Ginger, Zingiber officinalis, (GINGER PO) Take by mouth.   glimepiride (AMARYL) 4 MG tablet Take 1 tablet (4 mg total) by mouth daily with breakfast.   HYDROmorphone (DILAUDID) 2 MG tablet Take 2 mg by mouth 4 (four) times daily as needed for severe pain.   Lancets (ONETOUCH DELICA PLUS LANCET33G) MISC CHECK BLOOD SUGAR TWICE DAILY   levothyroxine (SYNTHROID) 150 MCG tablet Take 1 tablet (150 mcg total) by mouth daily before breakfast.   lisinopril (ZESTRIL) 10 MG tablet TAKE 1 TABLET BY MOUTH TWICE A DAY AS DIRECTED   metFORMIN (GLUCOPHAGE) 1000 MG tablet TAKE 1 TABLET (1,000 MG TOTAL) BY MOUTH TWICE A DAY WITH FOOD   nortriptyline (PAMELOR) 50 MG capsule TAKE 3 CAPSULES BY MOUTH EVERY DAY AT BEDTIME   ONETOUCH VERIO test strip daily.    simvastatin (ZOCOR) 40 MG tablet TAKE 1 TABLET BY MOUTH ONCE DAILY   Turmeric (QC TUMERIC COMPLEX) 500 MG CAPS Take 500 mg by mouth at bedtime.     Allergies:   Trulicity [dulaglutide], Januvia [sitagliptin], Other, and Penicillins   Social History   Socioeconomic History   Marital status: Married    Spouse name: Not on file   Number of children: 3   Years of education: Not on file   Highest education level: Not on file  Occupational History   Occupation: Retired from Designer, fashion/clothing, also drove a dump truck  Tobacco Use   Smoking status: Never   Smokeless tobacco: Never  Building services engineer Use: Never used  Substance and Sexual Activity   Alcohol use: Never    Comment: occ beer    Drug use: No   Sexual activity: Not on file  Other Topics Concern   Not on file  Social History Narrative   Not on file   Social Determinants of Health   Financial Resource Strain: Low Risk  (03/04/2022)   Overall Financial Resource Strain (CARDIA)    Difficulty of Paying Living Expenses: Not hard at all  Food Insecurity: No Food Insecurity (03/04/2022)   Hunger Vital Sign    Worried About Running Out of Food in the Last Year: Never true    Ran Out of Food in the Last Year: Never true  Transportation Needs: No Transportation Needs (03/04/2022)   PRAPARE - Administrator, Civil Service (Medical): No    Lack of Transportation (Non-Medical): No  Physical Activity: Insufficiently Active (03/04/2022)   Exercise Vital Sign    Days of Exercise per Week: 7 days    Minutes of Exercise per Session: 20 min  Stress: No Stress Concern Present (03/04/2022)   [employer] of Occupational Health - Occupational Stress Questionnaire    Feeling of Stress : Not at all  Social Connections: Moderately Isolated (03/04/2022)   Social Connection and Isolation Panel [NHANES]    Frequency of Communication with Friends and Family: Once a week    Frequency of Social Gatherings with Friends and Family: More  than three times a week    Attends Religious Services: Never    Database administrator or Organizations: No    Attends Banker Meetings: Never    Marital Status: Married     Family History: The patient's family  history includes Arthritis in his father and mother; Breast cancer in his sister; Diabetes in his mother; Heart attack in his mother; Hypertension in his mother; Skin cancer in his father.  ROS:   Please see the history of present illness.     All other systems reviewed and are negative.  EKGs/Labs/Other Studies Reviewed:    The following studies were reviewed today:   EKG: April 26, 2022: Atrial flutter with a rapid ventricular response of 132.  With carotid sinus massage he briefly block down which identified the flutter waves.    Recent Labs: 10/06/2021: ALT 24; TSH 1.67 11/04/2021: BUN 21; Creatinine, Ser 1.27; Potassium 3.9; Sodium 141 04/26/2022: Hemoglobin 14.8; Platelets 255  Recent Lipid Panel    Component Value Date/Time   CHOL 123 10/06/2021 0826   TRIG 158.0 (H) 10/06/2021 0826   HDL 33.00 (L) 10/06/2021 0826   CHOLHDL 4 10/06/2021 0826   VLDL 31.6 10/06/2021 0826   LDLCALC 59 10/06/2021 0826   LDLDIRECT 76.0 10/09/2020 0840     Risk Assessment/Calculations:           Physical Exam:    Physical Exam: Blood pressure 101/73, pulse (!) 132, height 6' (1.829 m), weight 222 lb 6.4 oz (100.9 kg), SpO2 98 %.       GEN:  Well nourished, well developed in no acute distress HEENT: Normal NECK: No JVD; No carotid bruits LYMPHATICS: No lymphadenopathy CARDIAC: RR , tachy , soft systolic murmur  RESPIRATORY:  Clear to auscultation without rales, wheezing or rhonchi  ABDOMEN: Soft, non-tender, non-distended MUSCULOSKELETAL:  2-3+ pitting edema up to knees  No deformity  SKIN: Warm and dry NEUROLOGIC:  Alert and oriented x 3    ASSESSMENT:    1. Nonrheumatic aortic valve stenosis      PLAN:      Aortic stenosis: He has at  least moderate to severe aortic stenosis.  His gradient on today's echo does not appear as high but this may be due to low flow/low gradient aortic stenosis.  At some point he will need a TAVR.   2.  Acute on chronic combined systolic and diastolic congestive heart failure: He is having some shortness of breath.  Leg swelling is mildly worse.  He is not an extremis but clearly is having volume overload.  There is a chance that he has worsening EF is due to his rapid ventricular response and may improve with rate control and eventually cardioversion.   Our best option is to admit him to the hospital for rate control and eventual TEE/cardioversion.  We may elect to do heart catheterization for his TAVR workup before he is on Eliquis.   2.  HLD :  well controlled.   3.  HTN:    Blood pressure is a little low today.  I do not think that would be able to add any oral medications to slow his heart rate without compromising his blood pressure.       Medication Adjustments/Labs and Tests Ordered: Current medicines are reviewed at length with the patient today.  Concerns regarding medicines are outlined above.  Orders Placed This Encounter  Procedures   EKG 12-Lead   No orders of the defined types were placed in this encounter.     Patient Instructions  Medication Instructions:  Stop taking Potassium (Kdur) *If you need a refill on your cardiac medications before your next appointment, please call your pharmacy*     Your next appointment:  The format for your next appointment:   In Person  Provider: Dr Acie Fredrickson              Signed, Mertie Moores, MD  04/26/2022 1:33 PM    [place]

## 2022-04-26 NOTE — ED Notes (Signed)
Cardiology at bedside.

## 2022-04-26 NOTE — H&P (Addendum)
Cardiology Office Note:    Date:  04/26/2022   ID:  [name], DOB 05-09-1955, MRN [id number]  PCP:  Ardith Dark, MD   [facility] Providers Cardiologist:  Siya Flurry   Referring MD: No ref. provider found   Chief Complaint  Patient presents with   Tachycardia    Afib at MD office     History of Present Illness:    Thomas [name] is a 67 y.o. male with a hx of DM, HLD, HTN, stroke. He was recently scheduled to have some neck  surgery with Dr. Wynetta Emery.  He had an echocardiogram prior to his scheduled back surgery and he was found to have severe aortic stenosis.  Has occasional arm numbness / weakness.   Scheduled him for a working visit to discuss his aortic stenosis. His daughter is Sheppard Coil ( our Engineer, site )   Seen with wife, Kendal Hymen.  Has been told he has a murmur years ago.  Has not had any cardiac issues related to his AS No cp, dyspnea, syncope, no presyncope  Is not limited from a cardiology standpoint Able to do yard work without any CP or dyspnea   Walks with a cane because of his R leg nerve / back issues.   Occasional palpitations that last for 1-2 seconds .   Avoids salt, Eats a fairly healthy diet Worked in [employer], and [employer] )  Drove a dump truck,  has been a Curator   I have discussed the case with Dr. Wynetta Emery. Tremar is myelopathic - has ongoing  compression of his spinal cord.  The case would be about 2.5 hours. Anticipated blood loss would be minimal .    Oct. 24, 2023  Thomas [name] is seen for follow up of his AS, recent neck surgery  His numbness in his arms is better  Still has lowe back issues with numbness in his R leg - no plans for surgery .   Jan. 29, 2024  Thomas [name] is seen today for leg swelling , worsening chest pain  Was found to have atrial flutter on ECG today   Avoids salt   Not much exercise  Not inclined   Performed CSM today ,  was able to demonstrate A-flutter  waves - was not able to record on paper   Some shortness of breath .  Not necessarliy worse than usual  Wt is 222 lbs ( up 4 lbs from last visit in Oct. )   No CP .   Can feel the heart fluttering on occasion        Past Medical History:  Diagnosis Date   Aortic stenosis    Cervical myelopathy (HCC)    Diabetes mellitus without complication (HCC)    Hypercholesterolemia    Hypertension    Stroke [facility]) 1982   pt states he had a mini stroke in 1982    Past Surgical History:  Procedure Laterality Date   ANTERIOR CERVICAL DECOMP/DISCECTOMY FUSION N/A 10/28/2021   Procedure: ACDF - C3-C4 - C4-C5 - C5-C6;  Surgeon: [name], MD;  Location: [facility] OR;  Service: Neurosurgery;  Laterality: N/A;   BACK SURGERY     HERNIA REPAIR      Current Medications: No outpatient medications have been marked as taking for the 04/26/22 encounter [facility] Encounter).     Allergies:   Trulicity [dulaglutide], Januvia [sitagliptin], Other, and Penicillins   Social History   Socioeconomic History   Marital status:  Married    Spouse name: Not on file   Number of children: 3   Years of education: Not on file   Highest education level: Not on file  Occupational History   Occupation: Retired from [employer], also drove a dump truck  Tobacco Use   Smoking status: Never   Smokeless tobacco: Never  Vaping Use   Vaping Use: Never used  Substance and Sexual Activity   Alcohol use: Never    Comment: occ beer    Drug use: No   Sexual activity: Not on file  Other Topics Concern   Not on file  Social History Narrative   Not on file   Social Determinants of Health   Financial Resource Strain: [place]  (03/04/2022)   Overall Financial Resource Strain (CARDIA)    Difficulty of Paying Living Expenses: Not hard at all  Food Insecurity: No Food Insecurity (03/04/2022)   Hunger Vital Sign    Worried About Running Out of Food in the Last Year: Never true    [place] in the Last Year: Never true   Transportation Needs: No Transportation Needs (03/04/2022)   PRAPARE - Hydrologist (Medical): No    Lack of Transportation (Non-Medical): No  Physical Activity: Insufficiently Active (03/04/2022)   Exercise Vital Sign    Days of Exercise per Week: 7 days    Minutes of Exercise per Session: 20 min  Stress: No Stress Concern Present (03/04/2022)   [place]    Feeling of Stress : Not at all  Social Connections: Moderately Isolated (03/04/2022)   Social Connection and Isolation Panel [NHANES]    Frequency of Communication with Friends and Family: Once a week    Frequency of Social Gatherings with Friends and Family: More than three times a week    Attends Religious Services: Never    Marine scientist or Organizations: No    Attends Music therapist: Never    Marital Status: Married     Family History: The patient's family history includes Arthritis in his father and mother; Breast cancer in his sister; Diabetes in his mother; Heart attack in his mother; Hypertension in his mother; Skin cancer in his father.  ROS:   Please see the history of present illness.     All other systems reviewed and are negative.  EKGs/Labs/Other Studies Reviewed:    The following studies were reviewed today:   EKG: April 26, 2022: Atrial flutter with a rapid ventricular response of 132.  With carotid sinus massage he briefly block down which identified the flutter waves.    Recent Labs: 10/06/2021: ALT 24; TSH 1.67 04/26/2022: BUN 19; Creatinine, Ser 1.51; Hemoglobin 14.8; Magnesium 1.4; Platelets 255; Potassium 4.4; Sodium 138  Recent Lipid Panel    Component Value Date/Time   CHOL 123 10/06/2021 0826   TRIG 158.0 (H) 10/06/2021 0826   HDL 33.00 (L) 10/06/2021 0826   CHOLHDL 4 10/06/2021 0826   VLDL 31.6 10/06/2021 0826   LDLCALC 59 10/06/2021 0826   LDLDIRECT 76.0 10/09/2020 0840      Risk Assessment/Calculations:           Physical Exam:    Physical Exam: Blood pressure 109/84, pulse (!) 130, temperature 98.5 F (36.9 C), resp. rate (!) 22, SpO2 96 %.       GEN:  Well nourished, well developed in no acute distress HEENT: Normal NECK: No JVD; No  carotid bruits LYMPHATICS: No lymphadenopathy CARDIAC: RR , tachy , soft systolic murmur  RESPIRATORY:  Clear to auscultation without rales, wheezing or rhonchi  ABDOMEN: Soft, non-tender, non-distended MUSCULOSKELETAL:  2-3+ pitting edema up to knees  No deformity  SKIN: Warm and dry NEUROLOGIC:  Alert and oriented x 3    ASSESSMENT:    No diagnosis found.    PLAN:      Aortic stenosis: He has at least moderate to severe aortic stenosis.  His gradient on today's echo does not appear as high but this may be due to low flow/low gradient aortic stenosis.  At some point he will need a TAVR.   2.  Acute on chronic combined systolic and diastolic congestive heart failure: He is having some shortness of breath.  Leg swelling is mildly worse.  He is not an extremis but clearly is having volume overload.  There is a chance that he has worsening EF is due to his rapid ventricular response and may improve with rate control and eventually cardioversion.   Our best option is to admit him to the hospital for rate control and eventual TEE/cardioversion.  We may elect to do heart catheterization for his TAVR workup before he is on Eliquis.   2.  HLD :  well controlled.   3.  HTN:    Blood pressure is a little low today.  I do not think that would be able to add any oral medications to slow his heart rate without compromising his blood pressure.   4.  Atrial flutter:  rapid atrial flutter :   BP is low.   I doubt he would tolerate IV cardizem.   Will likely need Iv amiodarone Will need TEE / CV at some point        Medication Adjustments/Labs and Tests Ordered: Current medicines are reviewed at length  with the patient today.  Concerns regarding medicines are outlined above.  Orders Placed This Encounter  Procedures   ED Echo US Bedside   DG Chest 2 View   Basic metabolic panel   Magnesium   CBC   TSH   Diet NPO time specified   Cardiac monitoring   Initiate Carrier Fluid Protocol   Full code   Inpatient consult to Cardiology   heparin per pharmacy consult   Oxygen therapy   Pulse oximetry, continuous   POC CBG, ED   ED EKG   EKG 12-Lead   Insert peripheral IV   Admit to Inpatient (patient's expected length of stay will be greater than 2 midnights or inpatient only procedure)   Meds ordered this encounter  Medications   magnesium sulfate IVPB 2 g 50 mL   FOLLOWED BY Linked Order Group    amiodarone (NEXTERONE) 1.8 mg/mL load via infusion 150 mg    amiodarone (NEXTERONE PREMIX) 360-4.14 MG/200ML-% (1.8 mg/mL) IV infusion    amiodarone (NEXTERONE PREMIX) 360-4.14 MG/200ML-% (1.8 mg/mL) IV infusion       Patient Instructions  Medication Instructions:  Stop taking Potassium (Kdur) *If you need a refill on your cardiac medications before your next appointment, please call your pharmacy*  Signed, Kristeen Miss, MD 04/26/2022 5:06 PM    [facility]  Addendum:  Patient presented to the ED for admission. On exam he is comfortable. Remains in atrial flutter with RVR. Mild volume overload.   -- Admit to inpatient -- start IV amiodarone, IV heparin with plans for eventual Eliquis -- placed for TEE/DCCV potentially on 1/31 -- will  give IV lasix 40mg  x1 now -- supplement mag, BMET in am -- SSI   Signed, Reino Bellis, NP-C 04/26/2022, 5:11 PM Pager: [phone]

## 2022-04-27 ENCOUNTER — Other Ambulatory Visit (HOSPITAL_COMMUNITY): Payer: Self-pay

## 2022-04-27 ENCOUNTER — Encounter (HOSPITAL_COMMUNITY)
Admission: EM | Disposition: A | Discharge status: Home / Self Care | Payer: Self-pay | Source: Home / Self Care | Attending: Cardiovascular Disease

## 2022-04-27 DIAGNOSIS — I350 Nonrheumatic aortic (valve) stenosis: Secondary | ICD-10-CM | POA: Diagnosis not present

## 2022-04-27 DIAGNOSIS — I4892 Unspecified atrial flutter: Secondary | ICD-10-CM

## 2022-04-27 HISTORY — PX: RIGHT/LEFT HEART CATH AND CORONARY ANGIOGRAPHY: CATH118266

## 2022-04-27 LAB — LIPID PANEL
Cholesterol: 91 mg/dL (ref 0–200)
HDL: 30 mg/dL — ABNORMAL LOW (ref >40)
LDL Cholesterol: 46 mg/dL (ref 0–99)
Total CHOL/HDL Ratio: 3 RATIO
Triglycerides: 77 mg/dL (ref <150)
VLDL: 15 mg/dL (ref 0–40)

## 2022-04-27 LAB — POCT I-STAT 7, (LYTES, BLD GAS, ICA,H+H)
Acid-base deficit: 2 mmol/L (ref 0.0–2.0)
Bicarbonate: 22.4 mmol/L (ref 20.0–28.0)
Calcium, Ion: 1.22 mmol/L (ref 1.15–1.40)
HCT: 41 % (ref 39.0–52.0)
Hemoglobin: 13.9 g/dL (ref 13.0–17.0)
O2 Saturation: 89 %
Potassium: 4.1 mmol/L (ref 3.5–5.1)
Sodium: 138 mmol/L (ref 135–145)
TCO2: 24 mmol/L (ref 22–32)
pCO2 arterial: 38.1 mmHg (ref 32–48)
pH, Arterial: 7.377 (ref 7.35–7.45)
pO2, Arterial: 58 mmHg — ABNORMAL LOW (ref 83–108)

## 2022-04-27 LAB — POCT I-STAT EG7
Acid-base deficit: 1 mmol/L (ref 0.0–2.0)
Acid-base deficit: 2 mmol/L (ref 0.0–2.0)
Bicarbonate: 24.2 mmol/L (ref 20.0–28.0)
Bicarbonate: 24.1 mmol/L (ref 20.0–28.0)
Calcium, Ion: 1.23 mmol/L (ref 1.15–1.40)
Calcium, Ion: 1.23 mmol/L (ref 1.15–1.40)
HCT: 43 % (ref 39.0–52.0)
HCT: 42 % (ref 39.0–52.0)
Hemoglobin: 14.6 g/dL (ref 13.0–17.0)
Hemoglobin: 14.3 g/dL (ref 13.0–17.0)
O2 Saturation: 61 %
O2 Saturation: 62 %
Potassium: 4 mmol/L (ref 3.5–5.1)
Potassium: 4 mmol/L (ref 3.5–5.1)
Sodium: 140 mmol/L (ref 135–145)
Sodium: 139 mmol/L (ref 135–145)
TCO2: 26 mmol/L (ref 22–32)
TCO2: 25 mmol/L (ref 22–32)
pCO2, Ven: 43.3 mmHg — ABNORMAL LOW (ref 44–60)
pCO2, Ven: 42.9 mmHg — ABNORMAL LOW (ref 44–60)
pH, Ven: 7.356 (ref 7.25–7.43)
pH, Ven: 7.357 (ref 7.25–7.43)
pO2, Ven: 33 mmHg (ref 32–45)
pO2, Ven: 34 mmHg (ref 32–45)

## 2022-04-27 LAB — CBG MONITORING, ED: Glucose-Capillary: 123 mg/dL — ABNORMAL HIGH (ref 70–99)

## 2022-04-27 LAB — BASIC METABOLIC PANEL
Anion gap: 10 (ref 5–15)
BUN: 24 mg/dL — ABNORMAL HIGH (ref 8–23)
CO2: 24 mmol/L (ref 22–32)
Calcium: 8.9 mg/dL (ref 8.9–10.3)
Chloride: 103 mmol/L (ref 98–111)
Creatinine, Ser: 1.49 mg/dL — ABNORMAL HIGH (ref 0.61–1.24)
GFR, Estimated: 51 mL/min — ABNORMAL LOW (ref >60)
Glucose, Bld: 123 mg/dL — ABNORMAL HIGH (ref 70–99)
Potassium: 3.9 mmol/L (ref 3.5–5.1)
Sodium: 137 mmol/L (ref 135–145)

## 2022-04-27 LAB — MAGNESIUM: Magnesium: 1.5 mg/dL — ABNORMAL LOW (ref 1.7–2.4)

## 2022-04-27 LAB — CBC
HCT: 42.2 % (ref 39.0–52.0)
Hemoglobin: 14 g/dL (ref 13.0–17.0)
MCH: 30.6 pg (ref 26.0–34.0)
MCHC: 33.2 g/dL (ref 30.0–36.0)
MCV: 92.3 fL (ref 80.0–100.0)
Platelets: 223 10*3/uL (ref 150–400)
RBC: 4.57 MIL/uL (ref 4.22–5.81)
RDW: 12.4 % (ref 11.5–15.5)
WBC: 7.3 10*3/uL (ref 4.0–10.5)
nRBC: 0 % (ref 0.0–0.2)

## 2022-04-27 LAB — GLUCOSE, CAPILLARY
Glucose-Capillary: 178 mg/dL — ABNORMAL HIGH (ref 70–99)
Glucose-Capillary: 141 mg/dL — ABNORMAL HIGH (ref 70–99)
Glucose-Capillary: 211 mg/dL — ABNORMAL HIGH (ref 70–99)

## 2022-04-27 LAB — HEMOGLOBIN A1C
Hgb A1c MFr Bld: 6.8 % — ABNORMAL HIGH (ref 4.8–5.6)
Mean Plasma Glucose: 148.46 mg/dL

## 2022-04-27 LAB — TSH: TSH: 0.361 u[IU]/mL (ref 0.350–4.500)

## 2022-04-27 LAB — HIV ANTIBODY (ROUTINE TESTING W REFLEX): HIV Screen 4th Generation wRfx: NONREACTIVE

## 2022-04-27 LAB — HEPARIN LEVEL (UNFRACTIONATED)
Heparin Unfractionated: 0.44 IU/mL (ref 0.30–0.70)
Heparin Unfractionated: 0.27 IU/mL — ABNORMAL LOW (ref 0.30–0.70)

## 2022-04-27 SURGERY — RIGHT/LEFT HEART CATH AND CORONARY ANGIOGRAPHY
Anesthesia: LOCAL

## 2022-04-27 MED ORDER — LIDOCAINE HCL (PF) 1 % IJ SOLN
INTRAMUSCULAR | Status: AC
  Filled 2022-04-27: qty 30

## 2022-04-27 MED ORDER — ONDANSETRON HCL 4 MG/2ML IJ SOLN: 4.0000 mg | Freq: Four times a day (QID) | INTRAMUSCULAR | Status: DC | PRN

## 2022-04-27 MED ORDER — AMIODARONE HCL 200 MG PO TABS
400.0000 mg | ORAL_TABLET | Freq: Two times a day (BID) | ORAL | Status: DC
  Administered 2022-04-27 – 2022-04-28 (×2): 400 mg via ORAL
  Filled 2022-04-27 (×2): qty 2

## 2022-04-27 MED ORDER — SODIUM CHLORIDE 0.9 % IV SOLN: INTRAVENOUS | Status: DC

## 2022-04-27 MED ORDER — SODIUM CHLORIDE 0.9% FLUSH: 3.0000 mL | INTRAVENOUS | Status: DC | PRN

## 2022-04-27 MED ORDER — MAGNESIUM SULFATE 2 GM/50ML IV SOLN
2.0000 g | Freq: Once | INTRAVENOUS | Status: AC
  Administered 2022-04-27: 2 g via INTRAVENOUS
  Filled 2022-04-27: qty 50

## 2022-04-27 MED ORDER — HEPARIN (PORCINE) IN NACL 1000-0.9 UT/500ML-% IV SOLN
INTRAVENOUS | Status: AC
  Filled 2022-04-27: qty 1000

## 2022-04-27 MED ORDER — DIAZEPAM 5 MG PO TABS: 5.0000 mg | ORAL_TABLET | Freq: Four times a day (QID) | ORAL | Status: DC | PRN

## 2022-04-27 MED ORDER — HYDRALAZINE HCL 20 MG/ML IJ SOLN: 10.0000 mg | INTRAMUSCULAR | Status: AC | PRN

## 2022-04-27 MED ORDER — IOHEXOL 350 MG/ML SOLN
INTRAVENOUS | Status: DC | PRN
  Administered 2022-04-27: 110 mL

## 2022-04-27 MED ORDER — FENTANYL CITRATE (PF) 100 MCG/2ML IJ SOLN
INTRAMUSCULAR | Status: DC | PRN
  Administered 2022-04-27: 25 ug via INTRAVENOUS

## 2022-04-27 MED ORDER — ASPIRIN 81 MG PO CHEW: 81.0000 mg | CHEWABLE_TABLET | ORAL | Status: DC

## 2022-04-27 MED ORDER — VERAPAMIL HCL 2.5 MG/ML IV SOLN
INTRAVENOUS | Status: DC | PRN
  Administered 2022-04-27: 10 mL via INTRA_ARTERIAL

## 2022-04-27 MED ORDER — SODIUM CHLORIDE 0.9 % IV SOLN: INTRAVENOUS | Status: AC

## 2022-04-27 MED ORDER — HEPARIN SODIUM (PORCINE) 1000 UNIT/ML IJ SOLN
INTRAMUSCULAR | Status: DC | PRN
  Administered 2022-04-27: 5000 [IU] via INTRAVENOUS

## 2022-04-27 MED ORDER — MIDAZOLAM HCL 2 MG/2ML IJ SOLN
INTRAMUSCULAR | Status: DC | PRN
  Administered 2022-04-27: 2 mg via INTRAVENOUS

## 2022-04-27 MED ORDER — LABETALOL HCL 5 MG/ML IV SOLN: 10.0000 mg | INTRAVENOUS | Status: AC | PRN

## 2022-04-27 MED ORDER — LIDOCAINE HCL (PF) 1 % IJ SOLN
INTRAMUSCULAR | Status: DC | PRN
  Administered 2022-04-27: 2 mL
  Administered 2022-04-27: 1 mL

## 2022-04-27 MED ORDER — HEPARIN (PORCINE) 25000 UT/250ML-% IV SOLN
1500.0000 [IU]/h | INTRAVENOUS | Status: DC
  Administered 2022-04-28: 1500 [IU]/h via INTRAVENOUS
  Filled 2022-04-27: qty 250

## 2022-04-27 MED ORDER — HEPARIN (PORCINE) IN NACL 1000-0.9 UT/500ML-% IV SOLN
INTRAVENOUS | Status: DC | PRN
  Administered 2022-04-27 (×2): 500 mL

## 2022-04-27 MED ORDER — VERAPAMIL HCL 2.5 MG/ML IV SOLN
INTRAVENOUS | Status: AC
  Filled 2022-04-27: qty 2

## 2022-04-27 MED ORDER — SODIUM CHLORIDE 0.9 % IV SOLN
250.0000 mL | INTRAVENOUS | Status: DC | PRN
  Administered 2022-04-28: 250 mL via INTRAVENOUS

## 2022-04-27 MED ORDER — HEPARIN SODIUM (PORCINE) 1000 UNIT/ML IJ SOLN
INTRAMUSCULAR | Status: AC
  Filled 2022-04-27: qty 10

## 2022-04-27 MED ORDER — ACETAMINOPHEN 325 MG PO TABS: 650.0000 mg | ORAL_TABLET | ORAL | Status: DC | PRN

## 2022-04-27 MED ORDER — MIDAZOLAM HCL 2 MG/2ML IJ SOLN
INTRAMUSCULAR | Status: AC
  Filled 2022-04-27: qty 2

## 2022-04-27 MED ORDER — SODIUM CHLORIDE 0.9% FLUSH
3.0000 mL | Freq: Two times a day (BID) | INTRAVENOUS | Status: DC
  Administered 2022-04-27 – 2022-04-28 (×2): 3 mL via INTRAVENOUS

## 2022-04-27 MED ORDER — FENTANYL CITRATE (PF) 100 MCG/2ML IJ SOLN
INTRAMUSCULAR | Status: AC
  Filled 2022-04-27: qty 2

## 2022-04-27 SURGICAL SUPPLY — 19 items
CATH 5FR JL3.5 JR4 ANG PIG MP (CATHETERS) IMPLANT
CATH BALLN WEDGE 5F 110CM (CATHETERS) IMPLANT
CATH INFINITI 5 FR 3DRC (CATHETERS) IMPLANT
CATH INFINITI 5 FR AR1 MOD (CATHETERS) IMPLANT
CATH INFINITI 5FR AL1 (CATHETERS) IMPLANT
CATH INFINITI 5FR JL4 (CATHETERS) IMPLANT
DEVICE RAD COMP TR BAND LRG (VASCULAR PRODUCTS) IMPLANT
GLIDESHEATH SLEND SS 6F .021 (SHEATH) IMPLANT
GUIDEWIRE INQWIRE 1.5J.035X260 (WIRE) IMPLANT
INQWIRE 1.5J .035X260CM (WIRE) ×1
KIT HEART LEFT (KITS) ×1 IMPLANT
PACK CARDIAC CATHETERIZATION (CUSTOM PROCEDURE TRAY) ×1 IMPLANT
PROTECTION STATION PRESSURIZED (MISCELLANEOUS) ×1
SHEATH GLIDE SLENDER 4/5FR (SHEATH) IMPLANT
SHEATH PROBE COVER 6X72 (BAG) IMPLANT
STATION PROTECTION PRESSURIZED (MISCELLANEOUS) IMPLANT
TRANSDUCER W/STOPCOCK (MISCELLANEOUS) ×1 IMPLANT
TUBING CIL FLEX 10 FLL-RA (TUBING) ×1 IMPLANT
WIRE EMERALD ST .035X150CM (WIRE) IMPLANT

## 2022-04-27 NOTE — Anesthesia Preprocedure Evaluation (Signed)
Anesthesia Evaluation  Patient identified by MRN, date of birth, ID band Patient awake    Reviewed: Allergy & Precautions, NPO status , Patient's Chart, lab work & pertinent test results  Airway Mallampati: II  TM Distance: >3 FB Neck ROM: Full    Dental no notable dental hx. (+) Edentulous Upper, Edentulous Lower   Pulmonary neg pulmonary ROS   Pulmonary exam normal breath sounds clear to auscultation       Cardiovascular hypertension, Pt. on medications Normal cardiovascular exam Rhythm:Irregular Rate:Abnormal  04/26/2022 echo 1. Global hypokinesis worse in inferior wall . Left ventricular ejection  fraction, by estimation, is 40 to 45%. The left ventricle has mildly  decreased function. The left ventricle demonstrates global hypokinesis.  The left ventricular internal cavity  size was moderately dilated. There is mild left ventricular hypertrophy.  Left ventricular diastolic parameters are consistent with Grade II  diastolic dysfunction (pseudonormalization). Elevated left ventricular  end-diastolic pressure.   2. Right ventricular systolic function is normal. The right ventricular  size is normal.   3. Left atrial size was moderately dilated.   4. The mitral valve is abnormal. Mild mitral valve regurgitation. No  evidence of mitral stenosis. Moderate mitral annular calcification.   5. AS remains severe Gradients low and DVI higher than TTE done 10/15/21  but EF has dropped considerably . The aortic valve is tricuspid. There is  severe calcifcation of the aortic valve. There is severe thickening of the  aortic valve. Aortic valve  regurgitation is mild. No aortic stenosis is present.   6. Aortic dilatation noted. There is mild dilatation of the aortic root,  measuring 40 mm. There is mild dilatation of the ascending aorta,  measuring 39 mm.   7. The inferior vena cava is normal in size with greater than 50%  respiratory  variability, suggesting right atrial pressure of 3 mmHg.      Neuro/Psych  negative psych ROS   GI/Hepatic negative GI ROS, Neg liver ROS,,,  Endo/Other  diabetes, Type 2, Oral Hypoglycemic AgentsHypothyroidism    Renal/GU Renal diseaseCKD III  negative genitourinary   Musculoskeletal negative musculoskeletal ROS (+)    Abdominal   Peds  Hematology   Anesthesia Other Findings All: pcn, trulicity, januvia  Reproductive/Obstetrics                             Anesthesia Physical Anesthesia Plan  ASA: 4  Anesthesia Plan: MAC   Post-op Pain Management:    Induction: Intravenous  PONV Risk Score and Plan: 3 and 1 and Propofol infusion and Treatment may vary due to age or medical condition  Airway Management Planned: Natural Airway and Nasal Cannula  Additional Equipment:   Intra-op Plan:   Post-operative Plan:   Informed Consent: I have reviewed the patients History and Physical, chart, labs and discussed the procedure including the risks, benefits and alternatives for the proposed anesthesia with the patient or authorized representative who has indicated his/her understanding and acceptance.     Dental advisory given  Plan Discussed with: CRNA  Anesthesia Plan Comments:         Anesthesia Quick Evaluation

## 2022-04-27 NOTE — Progress Notes (Signed)
[place] for heparin  Indication:  atrial flutter  Allergies  Allergen Reactions   Trulicity [Dulaglutide] Hives   Januvia [Sitagliptin] Other (See Comments)    Red eyes   Other     Stomach medicine, not sure of name. Had a mini stroke   Penicillins Swelling    Arm swelling Has patient had a PCN reaction causing immediate rash, facial/tongue/throat swelling, SOB or lightheadedness with hypotension: No Has patient had a PCN reaction causing severe rash involving mucus membranes or skin necrosis: No Has patient had a PCN reaction that required hospitalization No Has patient had a PCN reaction occurring within the last 10 years: No If all of the above answers are "NO", then may proceed with Cephalosporin use.      Vital Signs: Temp: 98 F (36.7 C) (01/30 1232) Temp Source: Oral (01/30 1232) BP: 107/67 (01/30 1704) Pulse Rate: 103 (01/30 1704)  Labs: Recent Labs    04/26/22 1238 04/27/22 0334 04/27/22 1000 04/27/22 1618 04/27/22 1619 04/27/22 1628  HGB 14.8 14.0  --  14.6 14.3 13.9  HCT 44.9 42.2  --  43.0 42.0 41.0  PLT 255 223  --   --   --   --   HEPARINUNFRC  --  0.44 0.27*  --   --   --   CREATININE 1.51* 1.49*  --   --   --   --      Estimated Creatinine Clearance: 59.1 mL/min (A) (by C-G formula based on SCr of 1.49 mg/dL (H)).   Medical History: Past Medical History:  Diagnosis Date   Aortic stenosis    Cervical myelopathy (HCC)    Diabetes mellitus without complication ([place])    Hypercholesterolemia    Hypertension    Stroke ([place]) 1982   pt states he had a mini stroke in 1982    Assessment: 29 YOM with new Afib RVR not on anticoagulation PTA. Pharmacy consulted to dose heparin.  S/p cath 1/30. To restart heparin 8 hours post sheath removal. Sheath was removed ~1700  Goal of Therapy:  Heparin level 0.3-0.7 units/ml Monitor platelets by anticoagulation protocol: Yes   Plan:  At 0100, restart heparin 1500  units/hr Will f/u 8hr heparin level post restart Daily heparin level and CBC  Sherlon Handing, PharmD, BCPS Please see amion for complete clinical pharmacist phone list 04/27/2022 5:26 PM

## 2022-04-27 NOTE — Interval H&P Note (Signed)
History and Physical Interval Note:  04/27/2022 3:46 PM  Thomas [name]  has presented today for surgery, with the diagnosis of cardiomyopathy.  The various methods of treatment have been discussed with the patient and family. After consideration of risks, benefits and other options for treatment, the patient has consented to  Procedure(s): RIGHT/LEFT HEART CATH AND CORONARY ANGIOGRAPHY (N/A) as a surgical intervention.  The patient's history has been reviewed, patient examined, no change in status, stable for surgery.  I have reviewed the patient's chart and labs.  Questions were answered to the patient's satisfaction.     Shelva Majestic

## 2022-04-27 NOTE — H&P (View-Only) (Signed)
Rounding Note    Patient Name: Thomas [name] Date of Encounter: 04/27/2022  [place] Cardiologist: Mertie Moores, MD   Subjective   Patient reports that his palpitations resolved overnight. He denies sensation of chest pain and does not feel short of breath. Patient reports that lower extremity edema is improved following lasix yesterday.   Inpatient Medications    Scheduled Meds:  amiodarone  400 mg Oral BID   gabapentin  400 mg Oral TID   insulin aspart  0-15 Units Subcutaneous TID WC   levothyroxine  150 mcg Oral QAC breakfast   simvastatin  40 mg Oral QHS   Continuous Infusions:  amiodarone 30 mg/hr (04/27/22 0642)   heparin 1,400 Units/hr (04/27/22 0829)   PRN Meds: acetaminophen, ondansetron (ZOFRAN) IV   Vital Signs    Vitals:   04/26/22 2000 04/27/22 0000 04/27/22 0410 04/27/22 0600  BP: 101/65 91/72 (!) 94/56 109/72  Pulse: (!) 129 (!) 52 86 88  Resp: (!) 26 19 18 20   Temp: 98.6 F (37 C) 98.3 F (36.8 C) 97.9 F (36.6 C) 98.3 F (36.8 C)  TempSrc: Oral Oral Oral Oral  SpO2: 97% 95% 97% 96%    Intake/Output Summary (Last 24 hours) at 04/27/2022 0935 Last data filed at 04/27/2022 0409 Gross per 24 hour  Intake 191.96 ml  Output 1900 ml  Net -1708.04 ml      04/26/2022    9:04 AM 03/04/2022    2:07 PM 01/27/2022    7:56 AM  Last 3 Weights  Weight (lbs) 222 lb 6.4 oz 214 lb 218 lb 9.6 oz  Weight (kg) 100.88 kg 97.07 kg 99.156 kg      Telemetry    Currently atrial flutter, 3:1 conduction. HR in the 90s. Overnight primarily aflutter with 2:1 conduction, rates in the 130s.- Personally Reviewed  ECG    Atrial flutter, 3:1 conduction - Personally Reviewed  Physical Exam   GEN: No acute distress.   Neck: No JVD Cardiac: Slightly irregular rhythm. Systolic murmurs heard both RUSB and over apex. Respiratory: Clear to auscultation bilaterally. GI: Soft, nontender, non-distended  MS: 2+ lower extremity edema, R>L.  Neuro:   Nonfocal  Psych: Normal affect   Labs    High Sensitivity Troponin:  No results for input(s): "TROPONINIHS" in the last 720 hours.   Chemistry Recent Labs  Lab 04/26/22 1238 04/27/22 0334  NA 138 137  K 4.4 3.9  CL 102 103  CO2 23 24  GLUCOSE 87 123*  BUN 19 24*  CREATININE 1.51* 1.49*  CALCIUM 9.2 8.9  MG 1.4* 1.5*  GFRNONAA 50* 51*  ANIONGAP 13 10    Lipids  Recent Labs  Lab 04/27/22 0334  CHOL 91  TRIG 77  HDL 30*  LDLCALC 46  CHOLHDL 3.0    Hematology Recent Labs  Lab 04/26/22 1238 04/27/22 0334  WBC 9.1 7.3  RBC 4.84 4.57  HGB 14.8 14.0  HCT 44.9 42.2  MCV 92.8 92.3  MCH 30.6 30.6  MCHC 33.0 33.2  RDW 12.3 12.4  PLT 255 223   Thyroid  Recent Labs  Lab 04/27/22 0334  TSH 0.361    BNPNo results for input(s): "BNP", "PROBNP" in the last 168 hours.  DDimer No results for input(s): "DDIMER" in the last 168 hours.   Radiology    DG Chest 2 View  Result Date: 04/26/2022 CLINICAL DATA:  Atrial fibrillation, tachycardia. EXAM: CHEST - 2 VIEW COMPARISON:  November 04, 2021 FINDINGS: The  heart size and mediastinal contours are within normal limits. Both lungs are clear. Chronic elevation of right hemidiaphragm unchanged. The visualized skeletal structures are stable. IMPRESSION: No active cardiopulmonary disease. Electronically Signed   By: Wei-Chen  Lin M.D.   On: 04/26/2022 13:49   ECHOCARDIOGRAM COMPLETE  Result Date: 04/26/2022    ECHOCARDIOGRAM REPORT   Patient Name:   Thomas [name] Date of Exam: 04/26/2022 Medical Rec #:  [id number]       Height:       72.0 in Accession #:    [id number]      Weight:       214.0 lb Date of Birth:  11/01/1955       BSA:          2.193 m Patient Age:    67 years        BP:           112/81 mmHg Patient Gender: M               HR:           126 bpm. Exam Location:  [address] Procedure: 2D Echo, Cardiac Doppler and Color Doppler Indications:    I35.0 Aortic Stenosis  History:        Patient has prior history of  Echocardiogram examinations, most                 recent 10/15/2021. Stroke; Risk Factors:Hypertension, Diabetes                 and HLD.  Sonographer:    Tammie Crouch RCS Referring Phys: [id number] CHRISTOPHER D MCALHANY IMPRESSIONS  1. Global hypokinesis worse in inferior wall . Left ventricular ejection fraction, by estimation, is 40 to 45%. The left ventricle has mildly decreased function. The left ventricle demonstrates global hypokinesis. The left ventricular internal cavity size was moderately dilated. There is mild left ventricular hypertrophy. Left ventricular diastolic parameters are consistent with Grade II diastolic dysfunction (pseudonormalization). Elevated left ventricular end-diastolic pressure.  2. Right ventricular systolic function is normal. The right ventricular size is normal.  3. Left atrial size was moderately dilated.  4. The mitral valve is abnormal. Mild mitral valve regurgitation. No evidence of mitral stenosis. Moderate mitral annular calcification.  5. AS remains severe Gradients low and DVI higher than TTE done 10/15/21 but EF has dropped considerably . The aortic valve is tricuspid. There is severe calcifcation of the aortic valve. There is severe thickening of the aortic valve. Aortic valve regurgitation is mild. No aortic stenosis is present.  6. Aortic dilatation noted. There is mild dilatation of the aortic root, measuring 40 mm. There is mild dilatation of the ascending aorta, measuring 39 mm.  7. The inferior vena cava is normal in size with greater than 50% respiratory variability, suggesting right atrial pressure of 3 mmHg. FINDINGS  Left Ventricle: Global hypokinesis worse in inferior wall. Left ventricular ejection fraction, by estimation, is 40 to 45%. The left ventricle has mildly decreased function. The left ventricle demonstrates global hypokinesis. The left ventricular internal cavity size was moderately dilated. There is mild left ventricular hypertrophy. Left ventricular  diastolic parameters are consistent with Grade II diastolic dysfunction (pseudonormalization). Elevated left ventricular end-diastolic pressure. Right Ventricle: The right ventricular size is normal. No increase in right ventricular wall thickness. Right ventricular systolic function is normal. Left Atrium: Left atrial size was moderately dilated. Right Atrium: Right atrial size was normal in size. Pericardium: There is no evidence of   pericardial effusion. Mitral Valve: The mitral valve is abnormal. There is moderate thickening of the mitral valve leaflet(s). There is moderate calcification of the mitral valve leaflet(s). Moderate mitral annular calcification. Mild mitral valve regurgitation. No evidence of mitral valve stenosis. Tricuspid Valve: The tricuspid valve is normal in structure. Tricuspid valve regurgitation is not demonstrated. No evidence of tricuspid stenosis. Aortic Valve: AS remains severe Gradients low and DVI higher than TTE done 10/15/21 but EF has dropped considerably. The aortic valve is tricuspid. There is severe calcifcation of the aortic valve. There is severe thickening of the aortic valve. Aortic valve regurgitation is mild. Aortic regurgitation PHT measures 198 msec. No aortic stenosis is present. Aortic valve mean gradient measures 24.0 mmHg. Aortic valve peak gradient measures 39.2 mmHg. Aortic valve area, by VTI measures 0.93 cm. Pulmonic Valve: The pulmonic valve was normal in structure. Pulmonic valve regurgitation is not visualized. No evidence of pulmonic stenosis. Aorta: Aortic dilatation noted. There is mild dilatation of the aortic root, measuring 40 mm. There is mild dilatation of the ascending aorta, measuring 39 mm. Venous: The inferior vena cava is normal in size with greater than 50% respiratory variability, suggesting right atrial pressure of 3 mmHg. IAS/Shunts: No atrial level shunt detected by color flow Doppler.  LEFT VENTRICLE PLAX 2D LVIDd:         4.90 cm   Diastology  LVIDs:         4.00 cm   LV e' medial:    5.77 cm/s LV PW:         1.40 cm   LV E/e' medial:  25.3 LV IVS:        1.20 cm   LV e' lateral:   9.57 cm/s LVOT diam:     1.90 cm   LV E/e' lateral: 15.3 LV SV:         55 LV SV Index:   25 LVOT Area:     2.84 cm  RIGHT VENTRICLE RV Basal diam:  2.90 cm RV S prime:     9.57 cm/s TAPSE (M-mode): 1.6 cm LEFT ATRIUM             Index        RIGHT ATRIUM           Index LA diam:        4.30 cm 1.96 cm/m   RA Area:     12.20 cm LA Vol (A2C):   45.7 ml 20.84 ml/m  RA Volume:   27.70 ml  12.63 ml/m LA Vol (A4C):   60.6 ml 27.64 ml/m LA Biplane Vol: 54.7 ml 24.95 ml/m  AORTIC VALVE AV Area (Vmax):    0.86 cm AV Area (Vmean):   0.81 cm AV Area (VTI):     0.93 cm AV Vmax:           313.00 cm/s AV Vmean:          233.000 cm/s AV VTI:            0.589 m AV Peak Grad:      39.2 mmHg AV Mean Grad:      24.0 mmHg LVOT Vmax:         95.30 cm/s LVOT Vmean:        66.333 cm/s LVOT VTI:          0.193 m LVOT/AV VTI ratio: 0.33 AI PHT:            198 msec  AORTA Ao Root diam:  4.00 cm Ao Asc diam:  3.90 cm MITRAL VALVE MV Area (PHT):              SHUNTS MV Decel Time:              Systemic VTI:  0.19 m MV E velocity: 146.00 cm/s  Systemic Diam: 1.90 cm Jenkins Rouge MD Electronically signed by Jenkins Rouge MD Signature Date/Time: 04/26/2022/10:48:02 AM    Final     Cardiac Studies   04/26/22 TTE  IMPRESSIONS     1. Global hypokinesis worse in inferior wall . Left ventricular ejection  fraction, by estimation, is 40 to 45%. The left ventricle has mildly  decreased function. The left ventricle demonstrates global hypokinesis.  The left ventricular internal cavity  size was moderately dilated. There is mild left ventricular hypertrophy.  Left ventricular diastolic parameters are consistent with Grade II  diastolic dysfunction (pseudonormalization). Elevated left ventricular  end-diastolic pressure.   2. Right ventricular systolic function is normal. The right ventricular   size is normal.   3. Left atrial size was moderately dilated.   4. The mitral valve is abnormal. Mild mitral valve regurgitation. No  evidence of mitral stenosis. Moderate mitral annular calcification.   5. AS remains severe Gradients low and DVI higher than TTE done 10/15/21  but EF has dropped considerably . The aortic valve is tricuspid. There is  severe calcifcation of the aortic valve. There is severe thickening of the  aortic valve. Aortic valve  regurgitation is mild. No aortic stenosis is present.   6. Aortic dilatation noted. There is mild dilatation of the aortic root,  measuring 40 mm. There is mild dilatation of the ascending aorta,  measuring 39 mm.   7. The inferior vena cava is normal in size with greater than 50%  respiratory variability, suggesting right atrial pressure of 3 mmHg.   FINDINGS   Left Ventricle: Global hypokinesis worse in inferior wall. Left  ventricular ejection fraction, by estimation, is 40 to 45%. The left  ventricle has mildly decreased function. The left ventricle demonstrates  global hypokinesis. The left ventricular  internal cavity size was moderately dilated. There is mild left  ventricular hypertrophy. Left ventricular diastolic parameters are  consistent with Grade II diastolic dysfunction (pseudonormalization).  Elevated left ventricular end-diastolic pressure.   Right Ventricle: The right ventricular size is normal. No increase in  right ventricular wall thickness. Right ventricular systolic function is  normal.   Left Atrium: Left atrial size was moderately dilated.   Right Atrium: Right atrial size was normal in size.   Pericardium: There is no evidence of pericardial effusion.   Mitral Valve: The mitral valve is abnormal. There is moderate thickening  of the mitral valve leaflet(s). There is moderate calcification of the  mitral valve leaflet(s). Moderate mitral annular calcification. Mild  mitral valve regurgitation. No evidence   of mitral valve stenosis.   Tricuspid Valve: The tricuspid valve is normal in structure. Tricuspid  valve regurgitation is not demonstrated. No evidence of tricuspid  stenosis.   Aortic Valve: AS remains severe Gradients low and DVI higher than TTE done  10/15/21 but EF has dropped considerably. The aortic valve is tricuspid.  There is severe calcifcation of the aortic valve. There is severe  thickening of the aortic valve. Aortic  valve regurgitation is mild. Aortic regurgitation PHT measures 198 msec.  No aortic stenosis is present. Aortic valve mean gradient measures 24.0  mmHg. Aortic valve peak gradient measures 39.2 mmHg. Aortic  valve area, by  VTI measures 0.93 cm.   Pulmonic Valve: The pulmonic valve was normal in structure. Pulmonic valve  regurgitation is not visualized. No evidence of pulmonic stenosis.   Aorta: Aortic dilatation noted. There is mild dilatation of the aortic  root, measuring 40 mm. There is mild dilatation of the ascending aorta,  measuring 39 mm.   Venous: The inferior vena cava is normal in size with greater than 50%  respiratory variability, suggesting right atrial pressure of 3 mmHg.   IAS/Shunts: No atrial level shunt detected by color flow Doppler.    Patient Profile     Thomas [name] is a 67 y.o. male with a hx of DM type II, HLD, HTN, stroke, Severe AS, spinal spinal. Patient sent to the ED from his cardiology office visit due to atrial fib/flutter with RVR.  Assessment & Plan    Atrial flutter  Patient sent to the ED from his cardiology office visit due to dyspnea and edema in the setting of atrial flutter with RVR. Initial ECG with atrial flutter in 2:1 pattern. Due to low BP, patient was started on Amiodarone. Rates are now improved, in the 90s. ECG continues to show atrial flutter, now with 3:1 conduction.   Continue Amiodarone infusion, transition to oral Amiodarone this evening. Would anticipate 400mg  BID x1 week, 200mg  BID x1 week,  then 200mg  daily.  Patient currently on heparin due to the possibility that he might be able to undergo inpatient LHC as a part of TAVR workup. Will review timing of DOAC with Dr. Sallyanne Kuster. Plan for TEE/DCCV with Dr. Harrell Gave on 1/31.  Acute on Chronic combined systolic and diastolic CHF  Patient noted to have increased shortness of breath and lower extremity edema at his cardiology appointment yesterday. TTE yesterday noted LVEF 40-45%, global hypokinesis. Mild LV dilation with grade II diastolic dysfunction. Received IV lasix 40mg  once yesterday evening with good urine output per patient.   Patient without obvious elevation of JVP and has normal pulmonary exam. 2+ lower extremity edema R>L. Reports that swelling and breathing are both improved.  Given low/low-normal BP, newly noted LV dysfunction (tachy-mediated?), and preload dependence with AS, will defer further diuresis at this time. Patient is likely to be very sensitive to loop diuretics. Will also hold home Lisinopril due to low BP  Severe Aortic Stenosis  Patient with mean valve gradient 24 mmHg, peak gradient 39.50mmHg. VTI measures 0.93 cm2.   Patient largely asymptomatic, no significant exertional limitations, no pre-syncopal symptoms, no orthopnea.  Given AS, cautious use of diuretics Could consider completing inpatient LHC today as a part of TAVR workup.   DM type II  Continue SSI  Hypothyroidism  Continue home dose synthroid.  Hyperlipidemia  Continue Simvastatin  CKD stage IIIa  Creatine near recent baseline at 1.49 today. Will need close monitoring of BMP following LHC. Hold loop diuretics at this time.  For questions or updates, please contact [place] Please consult [email or web address] for contact info under        Signed, Lily Kocher, PA-C  04/27/2022, 9:35 AM    I have seen and examined the patient along with Lily Kocher, PA-C .  I have reviewed the chart, notes and new data.  I agree  with PA/NP's note.  Key new complaints: Feeling much better once he achieves good ventricular rate control.  Denies orthopnea/dyspnea at rest.  No angina. Key examination changes: Irregular rhythm.  Very faint almost an audible second heart sound.  Mid peaking  systolic ejection murmur in the aortic focus radiating to the carotids.  No diastolic murmurs.  Trivial peripheral edema.  There is no evidence of jugular venous distention. Key new findings / data: Telemetry shows atrial flutter with variable AV block, now with rates around 90 bpm.  Echo findings consistent with severe calcific aortic stenosis.  At least by the images we can obtain a transthoracic echo, there is no evidence of significant ascending aortic aneurysm.  Left ventricular systolic function is decreased just in my opinion EF is actually closer to 30%), but in a global pattern which may be due to increased afterload from aortic stenosis or tachycardia related.    PLAN:  He will require aortic valve replacement, either SAVR or TAVR.  Will need right and left heart catheterization before a decision can be made regarding optimal pathway to AVR.  Preferable to do this before we start long acting anticoagulants.  Set him up for the procedure today. This procedure has been fully reviewed with the patient and written informed consent has been obtained.  Tomorrow plan for TEE guided cardioversion.  Was also reviewed in detail.  Finally, will require a CT angiogram of the aorta with TAVR protocol, which we may have to delay to prevent contrast nephrotoxicity.  Could probably performed as an outpatient.  Once all that that is obtained we will present him to the structural heart team for decision regarding TAVR versus SAVR.  Ideally will have a 30-day uninterrupted period of full anticoagulation after cardioversion.  This likely will not be a problem.  He was not particularly symptomatic until the onset of the atrial arrhythmia.  Shared Decision  Making/Informed Consent The risks [stroke (1 in 1000), death (1 in 1000), kidney failure [usually temporary] (1 in 500), bleeding (1 in 200), allergic reaction [possibly serious] (1 in 200)], benefits (diagnostic support and management of coronary artery disease) and alternatives of a cardiac catheterization were discussed in detail with Thomas [name] and he is willing to proceed.   The risks [stroke, cardiac arrhythmias rarely resulting in the need for a temporary or permanent pacemaker, skin irritation or burns, esophageal damage, perforation (1:10,000 risk), bleeding, pharyngeal hematoma as well as other potential complications associated with conscious sedation including aspiration, arrhythmia, respiratory failure and death], benefits (treatment guidance, restoration of normal sinus rhythm, diagnostic support) and alternatives of a transesophageal echocardiogram guided cardioversion were discussed in detail with Thomas [name] and he is willing to proceed.   Talaysia Pinheiro, MD, FACC [facility] [phone] 04/27/2022, 11:00 AM    

## 2022-04-27 NOTE — Progress Notes (Signed)
   Heart Failure Stewardship Pharmacist Progress Note   PCP: Vivi Barrack, MD PCP-Cardiologist: Mertie Moores, MD    HPI:  67 yo M with PMH of T2DM, HLD, HTN, and stroke.  He was seen by outpatient cardiologist on 1/29 with LE edema, weight gain, shortness of breath, and worsening chest pain. EKG showed aflutter with RVR. Sent to the ED for diuresis and possible TEE/DCCV. ECHO 1/29 showed LVEF 40-45% (was 60-65% in 09/2021), global hypokinesis, mild LVH, G2DD, RV normal, severe AS. CXR clear. Started on amiodarone with worsening LV function.   Current HF Medications: None  Prior to admission HF Medications: ACE/ARB/ARNI: lisinopril 10 mg BID  Pertinent Lab Values: Serum creatinine 1.49, BUN 24, Potassium 3.9, Sodium 137, Magnesium 1.5, A1c 6.8   Vital Signs: Weight: 222 lbs on admission Blood pressure: 90-100/70s  Heart rate: 90s  I/O: -0.8L yesterday; net -1.7L  Medication Assistance / Insurance Benefits Check: Does the patient have prescription insurance?  Yes Type of insurance plan: UHC Medicare  Does the patient qualify for medication assistance through manufacturers or grants? Pending  Eligible grants and/or patient assistance programs: pending Medication assistance applications in progress: none  Medication assistance applications approved: none Approved medication assistance renewals will be completed by: pending  Outpatient Pharmacy:  Prior to admission outpatient pharmacy: [facility] Is the patient willing to use [facility] at discharge? Yes Is the patient willing to transition their outpatient pharmacy to utilize a [facility] outpatient pharmacy?   Pending    Assessment: 1. Acute on chronic systolic and diastolic CHF (LVEF 54-65%), due to presumed NICM. NYHA class III symptoms. - Received 1 dose of IV lasix yesterday. Further dosing per cardiology. Will likely need at least PRN dosing at discharge. - Consider starting BB once euvolemic and BP improved -  Further GDMT limited by low BP.  - Consider adding SGLT2i before discharge. Was on Farxiga in th epast but stopped due to cost in 2023.    Plan: 1) Medication changes recommended at this time: - Continue diuresis  2) Patient assistance: - Farxiga copay [phone] (has already used free trial of Farxiga card) - Jardiance copay $47 Delene Loll copay [phone]  3)  Education  - To be completed prior to discharge  Kerby Nora, PharmD, BCPS Heart Failure Stewardship Pharmacist Phone [phone]

## 2022-04-27 NOTE — Plan of Care (Signed)
  Problem: Education: Goal: Knowledge of General Education information will improve Description: Including pain rating scale, medication(s)/side effects and non-pharmacologic comfort measures Outcome: Progressing   Problem: Health Behavior/Discharge Planning: Goal: Ability to manage health-related needs will improve Outcome: Progressing   Problem: Cardiovascular: Goal: Ability to achieve and maintain adequate cardiovascular perfusion will improve Outcome: Progressing Goal: Vascular access site(s) Level 0-1 will be maintained Outcome: Progressing   Problem: Pain Managment: Goal: General experience of comfort will improve Outcome: Progressing   Problem: Safety: Goal: Ability to remain free from injury will improve Outcome: Progressing   

## 2022-04-27 NOTE — Progress Notes (Addendum)
Rounding Note    Patient Name: Thomas [name] Date of Encounter: 04/27/2022  [place] Cardiologist: Mertie Moores, MD   Subjective   Patient reports that his palpitations resolved overnight. He denies sensation of chest pain and does not feel short of breath. Patient reports that lower extremity edema is improved following lasix yesterday.   Inpatient Medications    Scheduled Meds:  amiodarone  400 mg Oral BID   gabapentin  400 mg Oral TID   insulin aspart  0-15 Units Subcutaneous TID WC   levothyroxine  150 mcg Oral QAC breakfast   simvastatin  40 mg Oral QHS   Continuous Infusions:  amiodarone 30 mg/hr (04/27/22 0642)   heparin 1,400 Units/hr (04/27/22 0829)   PRN Meds: acetaminophen, ondansetron (ZOFRAN) IV   Vital Signs    Vitals:   04/26/22 2000 04/27/22 0000 04/27/22 0410 04/27/22 0600  BP: 101/65 91/72 (!) 94/56 109/72  Pulse: (!) 129 (!) 52 86 88  Resp: (!) 26 19 18 20   Temp: 98.6 F (37 C) 98.3 F (36.8 C) 97.9 F (36.6 C) 98.3 F (36.8 C)  TempSrc: Oral Oral Oral Oral  SpO2: 97% 95% 97% 96%    Intake/Output Summary (Last 24 hours) at 04/27/2022 0935 Last data filed at 04/27/2022 0409 Gross per 24 hour  Intake 191.96 ml  Output 1900 ml  Net -1708.04 ml      04/26/2022    9:04 AM 03/04/2022    2:07 PM 01/27/2022    7:56 AM  Last 3 Weights  Weight (lbs) 222 lb 6.4 oz 214 lb 218 lb 9.6 oz  Weight (kg) 100.88 kg 97.07 kg 99.156 kg      Telemetry    Currently atrial flutter, 3:1 conduction. HR in the 90s. Overnight primarily aflutter with 2:1 conduction, rates in the 130s.- Personally Reviewed  ECG    Atrial flutter, 3:1 conduction - Personally Reviewed  Physical Exam   GEN: No acute distress.   Neck: No JVD Cardiac: Slightly irregular rhythm. Systolic murmurs heard both RUSB and over apex. Respiratory: Clear to auscultation bilaterally. GI: Soft, nontender, non-distended  MS: 2+ lower extremity edema, R>L.  Neuro:   Nonfocal  Psych: Normal affect   Labs    High Sensitivity Troponin:  No results for input(s): "TROPONINIHS" in the last 720 hours.   Chemistry Recent Labs  Lab 04/26/22 1238 04/27/22 0334  NA 138 137  K 4.4 3.9  CL 102 103  CO2 23 24  GLUCOSE 87 123*  BUN 19 24*  CREATININE 1.51* 1.49*  CALCIUM 9.2 8.9  MG 1.4* 1.5*  GFRNONAA 50* 51*  ANIONGAP 13 10    Lipids  Recent Labs  Lab 04/27/22 0334  CHOL 91  TRIG 77  HDL 30*  LDLCALC 46  CHOLHDL 3.0    Hematology Recent Labs  Lab 04/26/22 1238 04/27/22 0334  WBC 9.1 7.3  RBC 4.84 4.57  HGB 14.8 14.0  HCT 44.9 42.2  MCV 92.8 92.3  MCH 30.6 30.6  MCHC 33.0 33.2  RDW 12.3 12.4  PLT 255 223   Thyroid  Recent Labs  Lab 04/27/22 0334  TSH 0.361    BNPNo results for input(s): "BNP", "PROBNP" in the last 168 hours.  DDimer No results for input(s): "DDIMER" in the last 168 hours.   Radiology    DG Chest 2 View  Result Date: 04/26/2022 CLINICAL DATA:  Atrial fibrillation, tachycardia. EXAM: CHEST - 2 VIEW COMPARISON:  November 04, 2021 FINDINGS: The  heart size and mediastinal contours are within normal limits. Both lungs are clear. Chronic elevation of right hemidiaphragm unchanged. The visualized skeletal structures are stable. IMPRESSION: No active cardiopulmonary disease. Electronically Signed   By: Sherian Rein M.D.   On: 04/26/2022 13:49   ECHOCARDIOGRAM COMPLETE  Result Date: 04/26/2022    ECHOCARDIOGRAM REPORT   Patient Name:   Thomas [name] Date of Exam: 04/26/2022 Medical Rec #:  [id number]       Height:       72.0 in Accession #:    [id number]      Weight:       214.0 lb Date of Birth:  [birth year]       BSA:          2.193 m Patient Age:    67 years        BP:           112/81 mmHg Patient Gender: M               HR:           126 bpm. Exam Location:  [address] Procedure: 2D Echo, Cardiac Doppler and Color Doppler Indications:    I35.0 Aortic Stenosis  History:        Patient has prior history of  Echocardiogram examinations, most                 recent 10/15/2021. Stroke; Risk Factors:Hypertension, Diabetes                 and HLD.  Sonographer:    Clearence Ped RCS Referring Phys: [id number] CHRISTOPHER D MCALHANY IMPRESSIONS  1. Global hypokinesis worse in inferior wall . Left ventricular ejection fraction, by estimation, is 40 to 45%. The left ventricle has mildly decreased function. The left ventricle demonstrates global hypokinesis. The left ventricular internal cavity size was moderately dilated. There is mild left ventricular hypertrophy. Left ventricular diastolic parameters are consistent with Grade II diastolic dysfunction (pseudonormalization). Elevated left ventricular end-diastolic pressure.  2. Right ventricular systolic function is normal. The right ventricular size is normal.  3. Left atrial size was moderately dilated.  4. The mitral valve is abnormal. Mild mitral valve regurgitation. No evidence of mitral stenosis. Moderate mitral annular calcification.  5. AS remains severe Gradients low and DVI higher than TTE done 10/15/21 but EF has dropped considerably . The aortic valve is tricuspid. There is severe calcifcation of the aortic valve. There is severe thickening of the aortic valve. Aortic valve regurgitation is mild. No aortic stenosis is present.  6. Aortic dilatation noted. There is mild dilatation of the aortic root, measuring 40 mm. There is mild dilatation of the ascending aorta, measuring 39 mm.  7. The inferior vena cava is normal in size with greater than 50% respiratory variability, suggesting right atrial pressure of 3 mmHg. FINDINGS  Left Ventricle: Global hypokinesis worse in inferior wall. Left ventricular ejection fraction, by estimation, is 40 to 45%. The left ventricle has mildly decreased function. The left ventricle demonstrates global hypokinesis. The left ventricular internal cavity size was moderately dilated. There is mild left ventricular hypertrophy. Left ventricular  diastolic parameters are consistent with Grade II diastolic dysfunction (pseudonormalization). Elevated left ventricular end-diastolic pressure. Right Ventricle: The right ventricular size is normal. No increase in right ventricular wall thickness. Right ventricular systolic function is normal. Left Atrium: Left atrial size was moderately dilated. Right Atrium: Right atrial size was normal in size. Pericardium: There is no evidence of  pericardial effusion. Mitral Valve: The mitral valve is abnormal. There is moderate thickening of the mitral valve leaflet(s). There is moderate calcification of the mitral valve leaflet(s). Moderate mitral annular calcification. Mild mitral valve regurgitation. No evidence of mitral valve stenosis. Tricuspid Valve: The tricuspid valve is normal in structure. Tricuspid valve regurgitation is not demonstrated. No evidence of tricuspid stenosis. Aortic Valve: AS remains severe Gradients low and DVI higher than TTE done 10/15/21 but EF has dropped considerably. The aortic valve is tricuspid. There is severe calcifcation of the aortic valve. There is severe thickening of the aortic valve. Aortic valve regurgitation is mild. Aortic regurgitation PHT measures 198 msec. No aortic stenosis is present. Aortic valve mean gradient measures 24.0 mmHg. Aortic valve peak gradient measures 39.2 mmHg. Aortic valve area, by VTI measures 0.93 cm. Pulmonic Valve: The pulmonic valve was normal in structure. Pulmonic valve regurgitation is not visualized. No evidence of pulmonic stenosis. Aorta: Aortic dilatation noted. There is mild dilatation of the aortic root, measuring 40 mm. There is mild dilatation of the ascending aorta, measuring 39 mm. Venous: The inferior vena cava is normal in size with greater than 50% respiratory variability, suggesting right atrial pressure of 3 mmHg. IAS/Shunts: No atrial level shunt detected by color flow Doppler.  LEFT VENTRICLE PLAX 2D LVIDd:         4.90 cm   Diastology  LVIDs:         4.00 cm   LV e' medial:    5.77 cm/s LV PW:         1.40 cm   LV E/e' medial:  25.3 LV IVS:        1.20 cm   LV e' lateral:   9.57 cm/s LVOT diam:     1.90 cm   LV E/e' lateral: 15.3 LV SV:         55 LV SV Index:   25 LVOT Area:     2.84 cm  RIGHT VENTRICLE RV Basal diam:  2.90 cm RV S prime:     9.57 cm/s TAPSE (M-mode): 1.6 cm LEFT ATRIUM             Index        RIGHT ATRIUM           Index LA diam:        4.30 cm 1.96 cm/m   RA Area:     12.20 cm LA Vol (A2C):   45.7 ml 20.84 ml/m  RA Volume:   27.70 ml  12.63 ml/m LA Vol (A4C):   60.6 ml 27.64 ml/m LA Biplane Vol: 54.7 ml 24.95 ml/m  AORTIC VALVE AV Area (Vmax):    0.86 cm AV Area (Vmean):   0.81 cm AV Area (VTI):     0.93 cm AV Vmax:           313.00 cm/s AV Vmean:          233.000 cm/s AV VTI:            0.589 m AV Peak Grad:      39.2 mmHg AV Mean Grad:      24.0 mmHg LVOT Vmax:         95.30 cm/s LVOT Vmean:        66.333 cm/s LVOT VTI:          0.193 m LVOT/AV VTI ratio: 0.33 AI PHT:            198 msec  AORTA Ao Root diam:  4.00 cm Ao Asc diam:  3.90 cm MITRAL VALVE MV Area (PHT):              SHUNTS MV Decel Time:              Systemic VTI:  0.19 m MV E velocity: 146.00 cm/s  Systemic Diam: 1.90 cm Jenkins Rouge MD Electronically signed by Jenkins Rouge MD Signature Date/Time: 04/26/2022/10:48:02 AM    Final     Cardiac Studies   04/26/22 TTE  IMPRESSIONS     1. Global hypokinesis worse in inferior wall . Left ventricular ejection  fraction, by estimation, is 40 to 45%. The left ventricle has mildly  decreased function. The left ventricle demonstrates global hypokinesis.  The left ventricular internal cavity  size was moderately dilated. There is mild left ventricular hypertrophy.  Left ventricular diastolic parameters are consistent with Grade II  diastolic dysfunction (pseudonormalization). Elevated left ventricular  end-diastolic pressure.   2. Right ventricular systolic function is normal. The right ventricular   size is normal.   3. Left atrial size was moderately dilated.   4. The mitral valve is abnormal. Mild mitral valve regurgitation. No  evidence of mitral stenosis. Moderate mitral annular calcification.   5. AS remains severe Gradients low and DVI higher than TTE done 10/15/21  but EF has dropped considerably . The aortic valve is tricuspid. There is  severe calcifcation of the aortic valve. There is severe thickening of the  aortic valve. Aortic valve  regurgitation is mild. No aortic stenosis is present.   6. Aortic dilatation noted. There is mild dilatation of the aortic root,  measuring 40 mm. There is mild dilatation of the ascending aorta,  measuring 39 mm.   7. The inferior vena cava is normal in size with greater than 50%  respiratory variability, suggesting right atrial pressure of 3 mmHg.   FINDINGS   Left Ventricle: Global hypokinesis worse in inferior wall. Left  ventricular ejection fraction, by estimation, is 40 to 45%. The left  ventricle has mildly decreased function. The left ventricle demonstrates  global hypokinesis. The left ventricular  internal cavity size was moderately dilated. There is mild left  ventricular hypertrophy. Left ventricular diastolic parameters are  consistent with Grade II diastolic dysfunction (pseudonormalization).  Elevated left ventricular end-diastolic pressure.   Right Ventricle: The right ventricular size is normal. No increase in  right ventricular wall thickness. Right ventricular systolic function is  normal.   Left Atrium: Left atrial size was moderately dilated.   Right Atrium: Right atrial size was normal in size.   Pericardium: There is no evidence of pericardial effusion.   Mitral Valve: The mitral valve is abnormal. There is moderate thickening  of the mitral valve leaflet(s). There is moderate calcification of the  mitral valve leaflet(s). Moderate mitral annular calcification. Mild  mitral valve regurgitation. No evidence   of mitral valve stenosis.   Tricuspid Valve: The tricuspid valve is normal in structure. Tricuspid  valve regurgitation is not demonstrated. No evidence of tricuspid  stenosis.   Aortic Valve: AS remains severe Gradients low and DVI higher than TTE done  10/15/21 but EF has dropped considerably. The aortic valve is tricuspid.  There is severe calcifcation of the aortic valve. There is severe  thickening of the aortic valve. Aortic  valve regurgitation is mild. Aortic regurgitation PHT measures 198 msec.  No aortic stenosis is present. Aortic valve mean gradient measures 24.0  mmHg. Aortic valve peak gradient measures 39.2 mmHg. Aortic  valve area, by  VTI measures 0.93 cm.   Pulmonic Valve: The pulmonic valve was normal in structure. Pulmonic valve  regurgitation is not visualized. No evidence of pulmonic stenosis.   Aorta: Aortic dilatation noted. There is mild dilatation of the aortic  root, measuring 40 mm. There is mild dilatation of the ascending aorta,  measuring 39 mm.   Venous: The inferior vena cava is normal in size with greater than 50%  respiratory variability, suggesting right atrial pressure of 3 mmHg.   IAS/Shunts: No atrial level shunt detected by color flow Doppler.    Patient Profile     Thomas [name] is a 67 y.o. male with a hx of DM type II, HLD, HTN, stroke, Severe AS, spinal spinal. Patient sent to the ED from his cardiology office visit due to atrial fib/flutter with RVR.  Assessment & Plan    Atrial flutter  Patient sent to the ED from his cardiology office visit due to dyspnea and edema in the setting of atrial flutter with RVR. Initial ECG with atrial flutter in 2:1 pattern. Due to low BP, patient was started on Amiodarone. Rates are now improved, in the 90s. ECG continues to show atrial flutter, now with 3:1 conduction.   Continue Amiodarone infusion, transition to oral Amiodarone this evening. Would anticipate 400mg  BID x1 week, 200mg  BID x1 week,  then 200mg  daily.  Patient currently on heparin due to the possibility that he might be able to undergo inpatient LHC as a part of TAVR workup. Will review timing of DOAC with Dr. Sallyanne Kuster. Plan for TEE/DCCV with Dr. Harrell Gave on 1/31.  Acute on Chronic combined systolic and diastolic CHF  Patient noted to have increased shortness of breath and lower extremity edema at his cardiology appointment yesterday. TTE yesterday noted LVEF 40-45%, global hypokinesis. Mild LV dilation with grade II diastolic dysfunction. Received IV lasix 40mg  once yesterday evening with good urine output per patient.   Patient without obvious elevation of JVP and has normal pulmonary exam. 2+ lower extremity edema R>L. Reports that swelling and breathing are both improved.  Given low/low-normal BP, newly noted LV dysfunction (tachy-mediated?), and preload dependence with AS, will defer further diuresis at this time. Patient is likely to be very sensitive to loop diuretics. Will also hold home Lisinopril due to low BP  Severe Aortic Stenosis  Patient with mean valve gradient 24 mmHg, peak gradient 39.50mmHg. VTI measures 0.93 cm2.   Patient largely asymptomatic, no significant exertional limitations, no pre-syncopal symptoms, no orthopnea.  Given AS, cautious use of diuretics Could consider completing inpatient LHC today as a part of TAVR workup.   DM type II  Continue SSI  Hypothyroidism  Continue home dose synthroid.  Hyperlipidemia  Continue Simvastatin  CKD stage IIIa  Creatine near recent baseline at 1.49 today. Will need close monitoring of BMP following LHC. Hold loop diuretics at this time.  For questions or updates, please contact [place] Please consult [email or web address] for contact info under        Signed, Lily Kocher, PA-C  04/27/2022, 9:35 AM    I have seen and examined the patient along with Lily Kocher, PA-C .  I have reviewed the chart, notes and new data.  I agree  with PA/NP's note.  Key new complaints: Feeling much better once he achieves good ventricular rate control.  Denies orthopnea/dyspnea at rest.  No angina. Key examination changes: Irregular rhythm.  Very faint almost an audible second heart sound.  Mid peaking  systolic ejection murmur in the aortic focus radiating to the carotids.  No diastolic murmurs.  Trivial peripheral edema.  There is no evidence of jugular venous distention. Key new findings / data: Telemetry shows atrial flutter with variable AV block, now with rates around 90 bpm.  Echo findings consistent with severe calcific aortic stenosis.  At least by the images we can obtain a transthoracic echo, there is no evidence of significant ascending aortic aneurysm.  Left ventricular systolic function is decreased just in my opinion EF is actually closer to 30%), but in a global pattern which may be due to increased afterload from aortic stenosis or tachycardia related.    PLAN:  He will require aortic valve replacement, either SAVR or TAVR.  Will need right and left heart catheterization before a decision can be made regarding optimal pathway to AVR.  Preferable to do this before we start long acting anticoagulants.  Set him up for the procedure today. This procedure has been fully reviewed with the patient and written informed consent has been obtained.  Tomorrow plan for TEE guided cardioversion.  Was also reviewed in detail.  Finally, will require a CT angiogram of the aorta with TAVR protocol, which we may have to delay to prevent contrast nephrotoxicity.  Could probably performed as an outpatient.  Once all that that is obtained we will present him to the structural heart team for decision regarding TAVR versus SAVR.  Ideally will have a 30-day uninterrupted period of full anticoagulation after cardioversion.  This likely will not be a problem.  He was not particularly symptomatic until the onset of the atrial arrhythmia.  Shared Decision  Making/Informed Consent The risks [stroke (1 in 1000), death (1 in 1000), kidney failure [usually temporary] (1 in 500), bleeding (1 in 200), allergic reaction [possibly serious] (1 in 200)], benefits (diagnostic support and management of coronary artery disease) and alternatives of a cardiac catheterization were discussed in detail with Mr. Fulford and he is willing to proceed.   The risks [stroke, cardiac arrhythmias rarely resulting in the need for a temporary or permanent pacemaker, skin irritation or burns, esophageal damage, perforation (1:10,000 risk), bleeding, pharyngeal hematoma as well as other potential complications associated with conscious sedation including aspiration, arrhythmia, respiratory failure and death], benefits (treatment guidance, restoration of normal sinus rhythm, diagnostic support) and alternatives of a transesophageal echocardiogram guided cardioversion were discussed in detail with Mr. Simson and he is willing to proceed.   Thurmon Fair, MD, [facility] [phone] 04/27/2022, 11:00 AM

## 2022-04-27 NOTE — Progress Notes (Signed)
[place] for heparin  Indication:  atrial flutter  Allergies  Allergen Reactions   Trulicity [Dulaglutide] Hives   Januvia [Sitagliptin] Other (See Comments)    Red eyes   Other     Stomach medicine, not sure of name. Had a mini stroke   Penicillins Swelling    Arm swelling Has patient had a PCN reaction causing immediate rash, facial/tongue/throat swelling, SOB or lightheadedness with hypotension: No Has patient had a PCN reaction causing severe rash involving mucus membranes or skin necrosis: No Has patient had a PCN reaction that required hospitalization No Has patient had a PCN reaction occurring within the last 10 years: No If all of the above answers are "NO", then may proceed with Cephalosporin use.      Vital Signs: Temp: 98.3 F (36.8 C) (01/30 0600) Temp Source: Oral (01/30 0600) BP: 117/72 (01/30 1000) Pulse Rate: 35 (01/30 1000)  Labs: Recent Labs    04/26/22 1238 04/27/22 0334 04/27/22 1000  HGB 14.8 14.0  --   HCT 44.9 42.2  --   PLT 255 223  --   HEPARINUNFRC  --  0.44 0.27*  CREATININE 1.51* 1.49*  --      Estimated Creatinine Clearance: 59.1 mL/min (A) (by C-G formula based on SCr of 1.49 mg/dL (H)).   Medical History: Past Medical History:  Diagnosis Date   Aortic stenosis    Cervical myelopathy (HCC)    Diabetes mellitus without complication ([place])    Hypercholesterolemia    Hypertension    Stroke ([place]) 1982   pt states he had a mini stroke in 1982    Assessment: 64 YOM with new Afib RVR not on anticoagulation PTA. Pharmacy consulted to dose heparin.  Heparin level originally therapeutic at 0.44 while on heparin 1400 units/hr. Repeat for confirmation resulted low at 0.27 - CBC stable with no signs of bleeding.  Goal of Therapy:  Heparin level 0.3-0.7 units/ml Monitor platelets by anticoagulation protocol: Yes   Plan:  Increase heparin to 1500 units/hr Check heparin level at 1800 Daily CBC and  heparin level  Erskine Speed, PharmD Clinical Pharmacist 04/27/2022 11:06 AM

## 2022-04-27 NOTE — Progress Notes (Signed)
[facility] for heparin  Indication:  atrial flutter Brief A/P: Heparin level within goal range Continue Heparin at current rate   Allergies  Allergen Reactions   Trulicity [Dulaglutide] Hives   Januvia [Sitagliptin] Other (See Comments)    Red eyes   Other     Stomach medicine, not sure of name. Had a mini stroke   Penicillins Swelling    Arm swelling Has patient had a PCN reaction causing immediate rash, facial/tongue/throat swelling, SOB or lightheadedness with hypotension: No Has patient had a PCN reaction causing severe rash involving mucus membranes or skin necrosis: No Has patient had a PCN reaction that required hospitalization No Has patient had a PCN reaction occurring within the last 10 years: No If all of the above answers are "NO", then may proceed with Cephalosporin use.      Vital Signs: Temp: 98.3 F (36.8 C) (01/30 0000) Temp Source: Oral (01/30 0000) BP: 91/72 (01/30 0000) Pulse Rate: 52 (01/30 0000)  Labs: Recent Labs    04/26/22 1238 04/27/22 0334  HGB 14.8 14.0  HCT 44.9 42.2  PLT 255 223  HEPARINUNFRC  --  0.44  CREATININE 1.51* 1.49*     Estimated Creatinine Clearance: 59.1 mL/min (A) (by C-G formula based on SCr of 1.49 mg/dL (H)).   Medical History: Past Medical History:  Diagnosis Date   Aortic stenosis    Cervical myelopathy (HCC)    Diabetes mellitus without complication ([place])    Hypercholesterolemia    Hypertension    Stroke [facility]) 1982   pt states he had a mini stroke in 1982    Assessment: 67 y.o. male with Aflutter for heparin  Goal of Therapy:  Heparin level 0.3-0.7 units/ml Monitor platelets by anticoagulation protocol: Yes   Plan:  Continue Heparin at current rate   Phillis Knack, PharmD, BCPS  04/27/2022 4:28 AM

## 2022-04-27 NOTE — ED Notes (Signed)
ED TO INPATIENT HANDOFF REPORT  ED Nurse Name and Phone #:  [phone]  S Name/Age/Gender Thomas [name] 67 y.o. male Room/Bed: 012C/012C  Code Status   Code Status: Full Code  Home/SNF/Other Home Patient oriented to: self, place, time, and situation Is this baseline? Yes   Triage Complete: Triage complete  Chief Complaint Atrial flutter (HCC) [I48.92] Atrial fibrillation with RVR (HCC) [I48.91]  Triage Note Patient was seen at his cardiologist today for follow up and found to be in afib rvr.  He has hx of heart murmur.  Patient denies MI.  He denies any chest pain, denies any shortness of [name]   Allergies Allergies  Allergen Reactions   Trulicity [Dulaglutide] Hives   Januvia [Sitagliptin] Other (See Comments)    Red eyes   Other     Stomach medicine, not sure of name. Had a mini stroke   Penicillins Swelling    Arm swelling Has patient had a PCN reaction causing immediate rash, facial/tongue/throat swelling, SOB or lightheadedness with hypotension: No Has patient had a PCN reaction causing severe rash involving mucus membranes or skin necrosis: No Has patient had a PCN reaction that required hospitalization No Has patient had a PCN reaction occurring within the last 10 years: No If all of the above answers are "NO", then may proceed with Cephalosporin use.    Level of Care/Admitting Diagnosis ED Disposition     ED Disposition  Admit   Condition  --   Comment  Hospital Area: [facility] [[id number]]  Level of Care: Progressive [102]  Admit to Progressive based on following criteria: CARDIOVASCULAR & THORACIC of moderate stability with acute coronary syndrome symptoms/low risk myocardial infarction/hypertensive urgency/arrhythmias/heart failure potentially compromising stability and stable post cardiovascular intervention patients.  May admit patient to [facility] or [facility] if equivalent level of care is available:: No  Covid Evaluation:  Asymptomatic - no recent exposure (last 10 days) testing not required  Diagnosis: Atrial fibrillation with RVR [facility]) [[id number]]  Admitting Physician: Thurmon Fair [[id number]]  Attending Physician: Thurmon Fair [[id number]]  Certification:: I certify this patient will need inpatient services for at least 2 midnights          B Medical/Surgery History Past Medical History:  Diagnosis Date   Aortic stenosis    Cervical myelopathy (HCC)    Diabetes mellitus without complication (HCC)    Hypercholesterolemia    Hypertension    Stroke (HCC) 1982   pt states he had a mini stroke in 1982   Past Surgical History:  Procedure Laterality Date   ANTERIOR CERVICAL DECOMP/DISCECTOMY FUSION N/A 10/28/2021   Procedure: ACDF - C3-C4 - C4-C5 - C5-C6;  Surgeon: [name], MD;  Location: [facility] OR;  Service: Neurosurgery;  Laterality: N/A;   BACK SURGERY     HERNIA REPAIR       A IV Location/Drains/Wounds Patient Lines/Drains/Airways Status     Active Line/Drains/Airways     Name Placement date Placement time Site Days   Peripheral IV 04/26/22 18 G Left Antecubital 04/26/22  1605  Antecubital  1   Peripheral IV 04/26/22 20 G Anterior;Right Forearm 04/26/22  1737  Forearm  1   Incision (Closed) 10/28/21 Neck Other (Comment) 10/28/21  1415  -- 181            Intake/Output Last 24 hours  Intake/Output Summary (Last 24 hours) at 04/27/2022 1042 Last data filed at 04/27/2022 0409 Gross per 24 hour  Intake 191.96 ml  Output 1900 ml  Net -1708.04 ml    Labs/Imaging Results for orders placed or performed during the hospital encounter of 04/26/22 (from the past 48 hour(s))  POC CBG, ED     Status: None   Collection Time: 04/26/22 12:37 PM  Result Value Ref Range   Glucose-Capillary 86 70 - 99 mg/dL    Comment: Glucose reference range applies only to samples taken after fasting for at least 8 hours.  Basic metabolic panel     Status: Abnormal   Collection Time: 04/26/22 12:38 PM  Result Value  Ref Range   Sodium 138 135 - 145 mmol/L   Potassium 4.4 3.5 - 5.1 mmol/L   Chloride 102 98 - 111 mmol/L   CO2 23 22 - 32 mmol/L   Glucose, Bld 87 70 - 99 mg/dL    Comment: Glucose reference range applies only to samples taken after fasting for at least 8 hours.   BUN 19 8 - 23 mg/dL   Creatinine, Ser 3.79 (H) 0.61 - 1.24 mg/dL   Calcium 9.2 8.9 - 02.4 mg/dL   GFR, Estimated 50 (L) >60 mL/min    Comment: (NOTE) Calculated using the CKD-EPI Creatinine Equation (2021)    Anion gap 13 5 - 15    Comment: Performed at [facility], [address] [place], [place] [address]  Magnesium     Status: Abnormal   Collection Time: 04/26/22 12:38 PM  Result Value Ref Range   Magnesium 1.4 (L) 1.7 - 2.4 mg/dL    Comment: Performed at [facility], [address], [place], [place] [address]  CBC     Status: None   Collection Time: 04/26/22 12:38 PM  Result Value Ref Range   WBC 9.1 4.0 - 10.5 K/uL   RBC 4.84 4.22 - 5.81 MIL/uL   Hemoglobin 14.8 13.0 - 17.0 g/dL   HCT 42.6 83.4 - 19.6 %   MCV 92.8 80.0 - 100.0 fL   MCH 30.6 26.0 - 34.0 pg   MCHC 33.0 30.0 - 36.0 g/dL   RDW 22.2 97.9 - 89.2 %   Platelets 255 150 - 400 K/uL   nRBC 0.0 0.0 - 0.2 %    Comment: Performed at [facility], [address] [place], [place] [address]  TSH     Status: None   Collection Time: 04/26/22 12:38 PM  Result Value Ref Range   TSH 0.504 0.350 - 4.500 uIU/mL    Comment: Performed by a 3rd Generation assay with a functional sensitivity of <=0.01 uIU/mL. Performed at [facility], [address] [place], [place] [address]   TSH     Status: None   Collection Time: 04/27/22  3:34 AM  Result Value Ref Range   TSH 0.361 0.350 - 4.500 uIU/mL    Comment: Performed by a 3rd Generation assay with a functional sensitivity of <=0.01 uIU/mL. Performed at [facility], [address] [place], [place] [address]   Hemoglobin A1c     Status: Abnormal   Collection Time: 04/27/22  3:34 AM   Result Value Ref Range   Hgb A1c MFr Bld 6.8 (H) 4.8 - 5.6 %    Comment: (NOTE) Pre diabetes:          5.7%-6.4%  Diabetes:              >6.4%  Glycemic control for   <7.0% adults with diabetes    Mean Plasma Glucose 148.46 mg/dL    Comment:  Performed at [facility], [address] [place], [place] [address]  Basic metabolic panel     Status: Abnormal   Collection Time: 04/27/22  3:34 AM  Result Value Ref Range   Sodium 137 135 - 145 mmol/L   Potassium 3.9 3.5 - 5.1 mmol/L   Chloride 103 98 - 111 mmol/L   CO2 24 22 - 32 mmol/L   Glucose, Bld 123 (H) 70 - 99 mg/dL    Comment: Glucose reference range applies only to samples taken after fasting for at least 8 hours.   BUN 24 (H) 8 - 23 mg/dL   Creatinine, Ser 2.95 (H) 0.61 - 1.24 mg/dL   Calcium 8.9 8.9 - 62.1 mg/dL   GFR, Estimated 51 (L) >60 mL/min    Comment: (NOTE) Calculated using the CKD-EPI Creatinine Equation (2021)    Anion gap 10 5 - 15    Comment: Performed at [facility], [address] [place], [place] [address]  Lipid panel     Status: Abnormal   Collection Time: 04/27/22  3:34 AM  Result Value Ref Range   Cholesterol 91 0 - 200 mg/dL   Triglycerides 77 <784 mg/dL   HDL 30 (L) >69 mg/dL   Total CHOL/HDL Ratio 3.0 RATIO   VLDL 15 0 - 40 mg/dL   LDL Cholesterol 46 0 - 99 mg/dL    Comment:        Total Cholesterol/HDL:CHD Risk Coronary Heart Disease Risk Table                     Men   Women  1/2 Average Risk   3.4   3.3  Average Risk       5.0   4.4  2 X Average Risk   9.6   7.1  3 X Average Risk  23.4   11.0        Use the calculated Patient Ratio above and the CHD Risk Table to determine the patient's CHD Risk.        ATP III CLASSIFICATION (LDL):  <100     mg/dL   Optimal  629-528  mg/dL   Near or Above                    Optimal  130-159  mg/dL   Borderline  413-244  mg/dL   High  >010     mg/dL   Very High Performed at [facility], [address], [place], [place]  [address]   Heparin level (unfractionated)     Status: None   Collection Time: 04/27/22  3:34 AM  Result Value Ref Range   Heparin Unfractionated 0.44 0.30 - 0.70 IU/mL    Comment: (NOTE) The clinical reportable range upper limit is being lowered to >1.10 to align with the FDA approved guidance for the current laboratory assay.  If heparin results are below expected values, and patient dosage has  been confirmed, suggest follow up testing of antithrombin III levels. Performed at [facility], [address] [place], [place] [address]   CBC     Status: None   Collection Time: 04/27/22  3:34 AM  Result Value Ref Range   WBC 7.3 4.0 - 10.5 K/uL   RBC 4.57 4.22 - 5.81 MIL/uL   Hemoglobin 14.0 13.0 - 17.0 g/dL   HCT 34.7 42.5 - 95.6 %   MCV 92.3 80.0 - 100.0 fL   MCH 30.6  26.0 - 34.0 pg   MCHC 33.2 30.0 - 36.0 g/dL   RDW 62.8 36.6 - 29.4 %   Platelets 223 150 - 400 K/uL   nRBC 0.0 0.0 - 0.2 %    Comment: Performed at [facility], [address], [place], [place] [address]  Magnesium     Status: Abnormal   Collection Time: 04/27/22  3:34 AM  Result Value Ref Range   Magnesium 1.5 (L) 1.7 - 2.4 mg/dL    Comment: Performed at [facility], [address], [place], [place] [address]  CBG monitoring, ED     Status: Abnormal   Collection Time: 04/27/22  7:54 AM  Result Value Ref Range   Glucose-Capillary 123 (H) 70 - 99 mg/dL    Comment: Glucose reference range applies only to samples taken after fasting for at least 8 hours.   Comment 1 Notify RN    Comment 2 Document in Chart    DG Chest 2 View  Result Date: 04/26/2022 CLINICAL DATA:  Atrial fibrillation, tachycardia. EXAM: CHEST - 2 VIEW COMPARISON:  November 04, 2021 FINDINGS: The heart size and mediastinal contours are within normal limits. Both lungs are clear. Chronic elevation of right hemidiaphragm unchanged. The visualized skeletal structures are stable. IMPRESSION: No active cardiopulmonary disease.  Electronically Signed   By: Sherian Rein M.D.   On: 04/26/2022 13:49   ECHOCARDIOGRAM COMPLETE  Result Date: 04/26/2022    ECHOCARDIOGRAM REPORT   Patient Name:   Thomas [name] Date of Exam: 04/26/2022 Medical Rec #:  [id number]       Height:       72.0 in Accession #:    [id number]      Weight:       214.0 lb Date of Birth:  11-18-1955       BSA:          2.193 m Patient Age:    66 years        BP:           112/81 mmHg Patient Gender: M               HR:           126 bpm. Exam Location:  [address] Procedure: 2D Echo, Cardiac Doppler and Color Doppler Indications:    I35.0 Aortic Stenosis  History:        Patient has prior history of Echocardiogram examinations, most                 recent 10/15/2021. Stroke; Risk Factors:Hypertension, Diabetes                 and HLD.  Sonographer:    Clearence Ped RCS Referring Phys: [id number] CHRISTOPHER D MCALHANY IMPRESSIONS  1. Global hypokinesis worse in inferior wall . Left ventricular ejection fraction, by estimation, is 40 to 45%. The left ventricle has mildly decreased function. The left ventricle demonstrates global hypokinesis. The left ventricular internal cavity size was moderately dilated. There is mild left ventricular hypertrophy. Left ventricular diastolic parameters are consistent with Grade II diastolic dysfunction (pseudonormalization). Elevated left ventricular end-diastolic pressure.  2. Right ventricular systolic function is normal. The right ventricular size is normal.  3. Left atrial size was moderately dilated.  4. The mitral valve is abnormal. Mild mitral valve regurgitation. No evidence of mitral stenosis. Moderate mitral annular calcification.  5. AS remains severe Gradients low and DVI higher than TTE done 10/15/21 but EF has  dropped considerably . The aortic valve is tricuspid. There is severe calcifcation of the aortic valve. There is severe thickening of the aortic valve. Aortic valve regurgitation is mild. No aortic stenosis is present.  6.  Aortic dilatation noted. There is mild dilatation of the aortic root, measuring 40 mm. There is mild dilatation of the ascending aorta, measuring 39 mm.  7. The inferior vena cava is normal in size with greater than 50% respiratory variability, suggesting right atrial pressure of 3 mmHg. FINDINGS  Left Ventricle: Global hypokinesis worse in inferior wall. Left ventricular ejection fraction, by estimation, is 40 to 45%. The left ventricle has mildly decreased function. The left ventricle demonstrates global hypokinesis. The left ventricular internal cavity size was moderately dilated. There is mild left ventricular hypertrophy. Left ventricular diastolic parameters are consistent with Grade II diastolic dysfunction (pseudonormalization). Elevated left ventricular end-diastolic pressure. Right Ventricle: The right ventricular size is normal. No increase in right ventricular wall thickness. Right ventricular systolic function is normal. Left Atrium: Left atrial size was moderately dilated. Right Atrium: Right atrial size was normal in size. Pericardium: There is no evidence of pericardial effusion. Mitral Valve: The mitral valve is abnormal. There is moderate thickening of the mitral valve leaflet(s). There is moderate calcification of the mitral valve leaflet(s). Moderate mitral annular calcification. Mild mitral valve regurgitation. No evidence of mitral valve stenosis. Tricuspid Valve: The tricuspid valve is normal in structure. Tricuspid valve regurgitation is not demonstrated. No evidence of tricuspid stenosis. Aortic Valve: AS remains severe Gradients low and DVI higher than TTE done 10/15/21 but EF has dropped considerably. The aortic valve is tricuspid. There is severe calcifcation of the aortic valve. There is severe thickening of the aortic valve. Aortic valve regurgitation is mild. Aortic regurgitation PHT measures 198 msec. No aortic stenosis is present. Aortic valve mean gradient measures 24.0 mmHg. Aortic  valve peak gradient measures 39.2 mmHg. Aortic valve area, by VTI measures 0.93 cm. Pulmonic Valve: The pulmonic valve was normal in structure. Pulmonic valve regurgitation is not visualized. No evidence of pulmonic stenosis. Aorta: Aortic dilatation noted. There is mild dilatation of the aortic root, measuring 40 mm. There is mild dilatation of the ascending aorta, measuring 39 mm. Venous: The inferior vena cava is normal in size with greater than 50% respiratory variability, suggesting right atrial pressure of 3 mmHg. IAS/Shunts: No atrial level shunt detected by color flow Doppler.  LEFT VENTRICLE PLAX 2D LVIDd:         4.90 cm   Diastology LVIDs:         4.00 cm   LV e' medial:    5.77 cm/s LV PW:         1.40 cm   LV E/e' medial:  25.3 LV IVS:        1.20 cm   LV e' lateral:   9.57 cm/s LVOT diam:     1.90 cm   LV E/e' lateral: 15.3 LV SV:         55 LV SV Index:   25 LVOT Area:     2.84 cm  RIGHT VENTRICLE RV Basal diam:  2.90 cm RV S prime:     9.57 cm/s TAPSE (M-mode): 1.6 cm LEFT ATRIUM             Index        RIGHT ATRIUM           Index LA diam:        4.30 cm 1.96 cm/m  RA Area:     12.20 cm LA Vol (A2C):   45.7 ml 20.84 ml/m  RA Volume:   27.70 ml  12.63 ml/m LA Vol (A4C):   60.6 ml 27.64 ml/m LA Biplane Vol: 54.7 ml 24.95 ml/m  AORTIC VALVE AV Area (Vmax):    0.86 cm AV Area (Vmean):   0.81 cm AV Area (VTI):     0.93 cm AV Vmax:           313.00 cm/s AV Vmean:          233.000 cm/s AV VTI:            0.589 m AV Peak Grad:      39.2 mmHg AV Mean Grad:      24.0 mmHg LVOT Vmax:         95.30 cm/s LVOT Vmean:        66.333 cm/s LVOT VTI:          0.193 m LVOT/AV VTI ratio: 0.33 AI PHT:            198 msec  AORTA Ao Root diam: 4.00 cm Ao Asc diam:  3.90 cm MITRAL VALVE MV Area (PHT):              SHUNTS MV Decel Time:              Systemic VTI:  0.19 m MV E velocity: 146.00 cm/s  Systemic Diam: 1.90 cm Jenkins Rouge MD Electronically signed by Jenkins Rouge MD Signature Date/Time:  04/26/2022/10:48:02 AM    Final     Pending Labs Unresulted Labs (From admission, onward)     Start     Ordered   04/27/22 1000  Heparin level (unfractionated)  Once-Timed,   TIMED        04/27/22 0733   04/27/22 0500  Heparin level (unfractionated)  Daily,   R      04/26/22 1844   04/27/22 0500  CBC  Daily,   R      04/26/22 1844   04/26/22 1742  HIV Antibody (routine testing w rflx)  (HIV Antibody (Routine testing w reflex) panel)  Once,   R        04/26/22 1741            Vitals/Pain Today's Vitals   04/27/22 0800 04/27/22 0900 04/27/22 0930 04/27/22 1000  BP: 104/81 98/74  117/72  Pulse: (!) 34 98 94 (!) 35  Resp: 18 18 20  (!) 23  Temp:      TempSrc:      SpO2: 94% 99% 94% 94%  PainSc:        Isolation Precautions No active isolations  Medications Medications  levothyroxine (SYNTHROID) tablet 150 mcg (150 mcg Oral Given 04/27/22 0954)  gabapentin (NEURONTIN) capsule 400 mg (400 mg Oral Given 04/27/22 0954)  acetaminophen (TYLENOL) tablet 650 mg (has no administration in time range)  ondansetron (ZOFRAN) injection 4 mg (has no administration in time range)  amiodarone (NEXTERONE) 1.8 mg/mL load via infusion 150 mg (150 mg Intravenous Bolus from Bag 04/26/22 1730)    Followed by  amiodarone (NEXTERONE PREMIX) 360-4.14 MG/200ML-% (1.8 mg/mL) IV infusion (0 mg/hr Intravenous Stopped 04/26/22 2243)    Followed by  amiodarone (NEXTERONE PREMIX) 360-4.14 MG/200ML-% (1.8 mg/mL) IV infusion (30 mg/hr Intravenous New Bag/Given 04/27/22 0642)  insulin aspart (novoLOG) injection 0-15 Units (2 Units Subcutaneous Given 04/27/22 0810)  heparin ADULT infusion 100 units/mL (25000 units/219mL) (1,400 Units/hr Intravenous New Bag/Given 04/27/22 0829)  simvastatin (ZOCOR) tablet 40 mg (40 mg Oral Given 04/26/22 2129)  amiodarone (PACERONE) tablet 400 mg (has no administration in time range)  magnesium sulfate IVPB 2 g 50 mL (0 g Intravenous Stopped 04/26/22 1737)  furosemide (LASIX)  injection 40 mg (40 mg Intravenous Given 04/26/22 1737)  heparin bolus via infusion 5,000 Units (5,000 Units Intravenous Bolus from Bag 04/26/22 1757)  magnesium sulfate IVPB 2 g 50 mL (0 g Intravenous Stopped 04/27/22 0909)    Mobility walks     Focused Assessments Cardiac Assessment Handoff:  Cardiac Rhythm: Atrial fibrillation Lab Results  Component Value Date   TROPONINI <0.03 01/14/2016   No results found for: "DDIMER" Does the Patient currently have chest pain? No    R Recommendations: See Admitting Provider Note  Report given to:   Additional Notes: AOX4, asymptomatic aflutter, cath lab today, cardioversion tomorrow, Heparin at 1400 units/hr, amio at 30mg /HR, No chest pain, family updated.

## 2022-04-28 ENCOUNTER — Other Ambulatory Visit (HOSPITAL_COMMUNITY): Payer: Self-pay

## 2022-04-28 ENCOUNTER — Inpatient Hospital Stay (HOSPITAL_COMMUNITY): Payer: Medicare Other

## 2022-04-28 ENCOUNTER — Inpatient Hospital Stay (HOSPITAL_COMMUNITY): Payer: Medicare Other | Admitting: Anesthesiology

## 2022-04-28 ENCOUNTER — Encounter (HOSPITAL_COMMUNITY)
Admission: EM | Disposition: A | Discharge status: Home / Self Care | Payer: Self-pay | Source: Home / Self Care | Attending: Cardiovascular Disease

## 2022-04-28 ENCOUNTER — Ambulatory Visit: Payer: Medicare Other | Admitting: Cardiovascular Disease

## 2022-04-28 ENCOUNTER — Encounter (HOSPITAL_COMMUNITY): Payer: Self-pay | Admitting: Cardiovascular Disease

## 2022-04-28 ENCOUNTER — Inpatient Hospital Stay (HOSPITAL_COMMUNITY)
Admit: 2022-04-28 | Discharge: 2022-04-28 | Disposition: A | Discharge status: Home / Self Care | Payer: Medicare Other | Attending: Cardiology | Admitting: Cardiology

## 2022-04-28 ENCOUNTER — Other Ambulatory Visit: Payer: Self-pay

## 2022-04-28 DIAGNOSIS — I4891 Unspecified atrial fibrillation: Secondary | ICD-10-CM

## 2022-04-28 DIAGNOSIS — E1122 Type 2 diabetes mellitus with diabetic chronic kidney disease: Secondary | ICD-10-CM

## 2022-04-28 DIAGNOSIS — I340 Nonrheumatic mitral (valve) insufficiency: Secondary | ICD-10-CM

## 2022-04-28 DIAGNOSIS — I083 Combined rheumatic disorders of mitral, aortic and tricuspid valves: Secondary | ICD-10-CM

## 2022-04-28 DIAGNOSIS — I129 Hypertensive chronic kidney disease with stage 1 through stage 4 chronic kidney disease, or unspecified chronic kidney disease: Secondary | ICD-10-CM

## 2022-04-28 DIAGNOSIS — I483 Typical atrial flutter: Secondary | ICD-10-CM

## 2022-04-28 DIAGNOSIS — N183 Chronic kidney disease, stage 3 unspecified: Secondary | ICD-10-CM

## 2022-04-28 DIAGNOSIS — I350 Nonrheumatic aortic (valve) stenosis: Secondary | ICD-10-CM

## 2022-04-28 DIAGNOSIS — Z7984 Long term (current) use of oral hypoglycemic drugs: Secondary | ICD-10-CM

## 2022-04-28 HISTORY — PX: TEE WITHOUT CARDIOVERSION: SHX5443

## 2022-04-28 HISTORY — PX: CARDIOVERSION: SHX1299

## 2022-04-28 LAB — CBC
HCT: 43.2 % (ref 39.0–52.0)
Hemoglobin: 14.8 g/dL (ref 13.0–17.0)
MCH: 30.8 pg (ref 26.0–34.0)
MCHC: 34.3 g/dL (ref 30.0–36.0)
MCV: 89.8 fL (ref 80.0–100.0)
Platelets: 219 10*3/uL (ref 150–400)
RBC: 4.81 MIL/uL (ref 4.22–5.81)
RDW: 12.5 % (ref 11.5–15.5)
WBC: 7.7 10*3/uL (ref 4.0–10.5)
nRBC: 0 % (ref 0.0–0.2)

## 2022-04-28 LAB — BASIC METABOLIC PANEL
Anion gap: 12 (ref 5–15)
BUN: 18 mg/dL (ref 8–23)
CO2: 21 mmol/L — ABNORMAL LOW (ref 22–32)
Calcium: 8.8 mg/dL — ABNORMAL LOW (ref 8.9–10.3)
Chloride: 102 mmol/L (ref 98–111)
Creatinine, Ser: 1.57 mg/dL — ABNORMAL HIGH (ref 0.61–1.24)
GFR, Estimated: 48 mL/min — ABNORMAL LOW (ref >60)
Glucose, Bld: 126 mg/dL — ABNORMAL HIGH (ref 70–99)
Potassium: 4 mmol/L (ref 3.5–5.1)
Sodium: 135 mmol/L (ref 135–145)

## 2022-04-28 LAB — ECHO TEE
AV Mean grad: 19 mmHg
AV Peak grad: 28.3 mmHg
Ao pk vel: 2.66 m/s

## 2022-04-28 LAB — GLUCOSE, CAPILLARY
Glucose-Capillary: 129 mg/dL — ABNORMAL HIGH (ref 70–99)
Glucose-Capillary: 125 mg/dL — ABNORMAL HIGH (ref 70–99)
Glucose-Capillary: 172 mg/dL — ABNORMAL HIGH (ref 70–99)

## 2022-04-28 LAB — MAGNESIUM: Magnesium: 1.6 mg/dL — ABNORMAL LOW (ref 1.7–2.4)

## 2022-04-28 SURGERY — ECHOCARDIOGRAM, TRANSESOPHAGEAL
Anesthesia: Monitor Anesthesia Care

## 2022-04-28 MED ORDER — PROPOFOL 10 MG/ML IV BOLUS
INTRAVENOUS | Status: DC | PRN
  Administered 2022-04-28: 30 mg via INTRAVENOUS
  Administered 2022-04-28: 50 mg via INTRAVENOUS

## 2022-04-28 MED ORDER — ROSUVASTATIN CALCIUM 10 MG PO TABS
10.0000 mg | ORAL_TABLET | Freq: Every day | ORAL | 3 refills | Status: DC
  Filled 2022-04-28: qty 90, 90d supply, fill #0

## 2022-04-28 MED ORDER — IOHEXOL 350 MG/ML SOLN
100.0000 mL | Freq: Once | INTRAVENOUS | Status: AC | PRN
  Administered 2022-04-28: 100 mL via INTRAVENOUS

## 2022-04-28 MED ORDER — LIDOCAINE VISCOUS HCL 2 % MT SOLN
OROMUCOSAL | Status: AC
  Filled 2022-04-28: qty 15

## 2022-04-28 MED ORDER — MAGNESIUM SULFATE 4 GM/100ML IV SOLN
4.0000 g | Freq: Once | INTRAVENOUS | Status: AC
  Administered 2022-04-28: 4 g via INTRAVENOUS
  Filled 2022-04-28: qty 100

## 2022-04-28 MED ORDER — PROPOFOL 500 MG/50ML IV EMUL
INTRAVENOUS | Status: DC | PRN
  Administered 2022-04-28: 75 ug/kg/min via INTRAVENOUS

## 2022-04-28 MED ORDER — AMIODARONE HCL 200 MG PO TABS
ORAL_TABLET | ORAL | 1 refills | Status: DC
  Filled 2022-04-28: qty 45, 17d supply, fill #0

## 2022-04-28 MED ORDER — APIXABAN 5 MG PO TABS
5.0000 mg | ORAL_TABLET | Freq: Two times a day (BID) | ORAL | Status: DC
  Administered 2022-04-28: 5 mg via ORAL
  Filled 2022-04-28: qty 1

## 2022-04-28 MED ORDER — METFORMIN HCL 1000 MG PO TABS
ORAL_TABLET | ORAL | 0 refills | Status: DC
  Filled 2022-04-28: qty 180, 90d supply, fill #0

## 2022-04-28 MED ORDER — SODIUM CHLORIDE 0.9 % IV SOLN: INTRAVENOUS | Status: AC

## 2022-04-28 MED ORDER — SODIUM CHLORIDE 0.9 % IV SOLN: INTRAVENOUS | Status: DC | PRN

## 2022-04-28 MED ORDER — APIXABAN 5 MG PO TABS
5.0000 mg | ORAL_TABLET | Freq: Two times a day (BID) | ORAL | 3 refills | Status: DC
  Filled 2022-04-28: qty 60, 30d supply, fill #0

## 2022-04-28 NOTE — Progress Notes (Signed)
  Echocardiogram Echocardiogram Transesophageal has been performed.  Thomas [name] 04/28/2022, 9:31 AM

## 2022-04-28 NOTE — CV Procedure (Signed)
   TRANSESOPHAGEAL ECHOCARDIOGRAM GUIDED DIRECT CURRENT CARDIOVERSION  NAME:  Thomas [name]   MRN: [id number] DOB:  1956/02/25   ADMIT DATE: 04/26/2022  INDICATIONS: Symptomatic atrial fibrillation  PROCEDURE:   Informed consent was obtained prior to the procedure. The risks, benefits and alternatives for the procedure were discussed and the patient comprehended these risks.  Risks include, but are not limited to, cough, sore throat, vomiting, nausea, somnolence, esophageal and stomach trauma or perforation, bleeding, low blood pressure, aspiration, pneumonia, infection, trauma to the teeth and death.    After a procedural time-out, the oropharynx was anesthetized with viscous lidocaine and the patient was sedated by the anesthesia service. The transesophageal probe was inserted in the esophagus and stomach without difficulty and multiple views were obtained. Anesthesia was monitored by Dr. Valma Cava. Patient received 223.5 mg IV propofol.  COMPLICATIONS:    Complications: No complications Patient tolerated procedure well.  FINDINGS:  LEFT VENTRICLE: EF = 35-40% with global hypokinesis  RIGHT VENTRICLE: Normal size and function.   LEFT ATRIUM: No thrombus/mass.  LEFT ATRIAL APPENDAGE: No thrombus/mass.   RIGHT ATRIUM: No thrombus/mass.  AORTIC VALVE:  Trileaflet, calcified and restricted leaflets. Moderate regurgitation. No vegetation.  MITRAL VALVE:    Normal structure, with mildly restricted leaflet motion due toi LV dilation. Mild regurgitation. No vegetation.  TRICUSPID VALVE: Normal structure. Trivial regurgitation. No vegetation.  PULMONIC VALVE: Grossly normal structure. No regurgitation. No apparent vegetation.  INTERATRIAL SEPTUM: No PFO or ASD seen by color Doppler.  PERICARDIUM: No effusion noted.  DESCENDING AORTA: Trivial diffuse plaque seen   CARDIOVERSION:     Indications:  Symptomatic Atrial Fibrillation  Procedure Details:  Once the TEE was complete,  the patient had the defibrillator pads placed in the anterior and posterior position. Once an appropriate level of sedation was confirmed, the patient was cardioverted x 2 with 120, 150 J of biphasic synchronized energy with anterior pressure.  The patient converted to NSR.  There were no apparent complications.  The patient had normal neuro status and respiratory status post procedure with vitals stable as recorded elsewhere.  Adequate airway was maintained throughout and vital signs monitored per protocol.  Buford Dresser, MD, PhD, [facility] at [facility] at [facility] [address] [place], [place] [address] [phone]   8:33 AM

## 2022-04-28 NOTE — Progress Notes (Signed)
Heart Failure Navigator Progress Note  Assessed for Heart & Vascular TOC clinic readiness.  Patient EF 40-45%, has a structural heart appointment on 05/21/2022 for a TAVR evaluation. .   Navigator available for reassessment of patient.   Thomas [name], BSN, Clinical cytogeneticist Only

## 2022-04-28 NOTE — TOC Benefit Eligibility Note (Signed)
Patient Advocate Encounter  Insurance verification completed.    The patient is currently admitted and upon discharge could be taking Eliquis 5 mg.  The current 30 day co-pay is $47.00.   The patient is insured through [employer] Medicare Part D   Lilliam Chamblee, CPHT Pharmacy Patient Advocate Specialist [facility] Patient Advocate Team Direct Number: [phone]  Fax: [phone]       

## 2022-04-28 NOTE — Progress Notes (Signed)
Rounding Note    Patient Name: Thomas [name] Date of Encounter: 04/28/2022  [facility] Cardiologist: Mertie Moores, MD   Subjective   Seen after successful TEE/cardioversion.  Maintaining sinus rhythm, occasional PACs. Creatinine essentially unchanged at 1.57, following angiography yesterday. Minor nonobstructive CAD at cath.  Invasive evaluation confirmed severe aortic stenosis. TEE shows tricuspid aortic valve with severe calcific changes and restricted leaflet motion.  There is no evidence of aneurysm of the aortic root or ascending aorta. Left ventricular systolic function looks even worse on the TEE, severely depressed.  Inpatient Medications    Scheduled Meds:  amiodarone  400 mg Oral BID   gabapentin  400 mg Oral TID   insulin aspart  0-15 Units Subcutaneous TID WC   levothyroxine  150 mcg Oral QAC breakfast   simvastatin  40 mg Oral QHS   sodium chloride flush  3 mL Intravenous Q12H   Continuous Infusions:  sodium chloride 250 mL (04/28/22 0732)   heparin 1,500 Units/hr (04/28/22 0805)   PRN Meds: sodium chloride, acetaminophen, acetaminophen, diazepam, ondansetron (ZOFRAN) IV, ondansetron (ZOFRAN) IV, sodium chloride flush   Vital Signs    Vitals:   04/28/22 0835 04/28/22 0845 04/28/22 0855 04/28/22 0904  BP: (!) 89/66 96/65 102/62 (!) 98/59  Pulse: 66 65 67 66  Resp: 19 17 14 14   Temp: (!) 97 F (36.1 C)     TempSrc: Temporal     SpO2: 91% 94% 96% 97%  Weight:      Height:        Intake/Output Summary (Last 24 hours) at 04/28/2022 0926 Last data filed at 04/28/2022 0836 Gross per 24 hour  Intake 754.65 ml  Output 1900 ml  Net -1145.35 ml      04/28/2022    7:31 AM 04/27/2022    5:47 PM 04/26/2022    9:04 AM  Last 3 Weights  Weight (lbs) 222 lb 222 lb 222 lb 6.4 oz  Weight (kg) 100.699 kg 100.699 kg 100.88 kg      Telemetry    Sinus rhythm with occasional PACs- Personally Reviewed  ECG    Sinus rhythm with occasional PACs,  nonspecific IVCD/incomplete left bundle branch block, mildly prolonged QTc 462 ms- Personally Reviewed  Physical Exam  Looks well.  Appears comfortable lying flat in bed GEN: No acute distress.   Neck: No JVD Cardiac: RRR with occasional ectopy, second heart sound is essentially absent, grade 2/6 late peaking systolic ejection murmur, barely audible diastolic murmur at the right lower sternal border, rubs, or gallops.  Respiratory: Clear to auscultation bilaterally. GI: Soft, nontender, non-distended  MS: No edema; No deformity. Neuro:  Nonfocal  Psych: Normal affect   Labs    High Sensitivity Troponin:  No results for input(s): "TROPONINIHS" in the last 720 hours.   Chemistry Recent Labs  Lab 04/26/22 1238 04/27/22 0334 04/27/22 1618 04/27/22 1619 04/27/22 1628 04/28/22 0542  NA 138 137   < > 139 138 135  K 4.4 3.9   < > 4.0 4.1 4.0  CL 102 103  --   --   --  102  CO2 23 24  --   --   --  21*  GLUCOSE 87 123*  --   --   --  126*  BUN 19 24*  --   --   --  18  CREATININE 1.51* 1.49*  --   --   --  1.57*  CALCIUM 9.2 8.9  --   --   --  8.8*  MG 1.4* 1.5*  --   --   --  1.6*  GFRNONAA 50* 51*  --   --   --  48*  ANIONGAP 13 10  --   --   --  12   < > = values in this interval not displayed.    Lipids  Recent Labs  Lab 04/27/22 0334  CHOL 91  TRIG 77  HDL 30*  LDLCALC 46  CHOLHDL 3.0    Hematology Recent Labs  Lab 04/26/22 1238 04/27/22 0334 04/27/22 1618 04/27/22 1619 04/27/22 1628 04/28/22 0542  WBC 9.1 7.3  --   --   --  7.7  RBC 4.84 4.57  --   --   --  4.81  HGB 14.8 14.0   < > 14.3 13.9 14.8  HCT 44.9 42.2   < > 42.0 41.0 43.2  MCV 92.8 92.3  --   --   --  89.8  MCH 30.6 30.6  --   --   --  30.8  MCHC 33.0 33.2  --   --   --  34.3  RDW 12.3 12.4  --   --   --  12.5  PLT 255 223  --   --   --  219   < > = values in this interval not displayed.   Thyroid  Recent Labs  Lab 04/27/22 0334  TSH 0.361    BNPNo results for input(s): "BNP", "PROBNP"  in the last 168 hours.  DDimer No results for input(s): "DDIMER" in the last 168 hours.   Radiology    CARDIAC CATHETERIZATION  Result Date: 04/27/2022   Mid RCA lesion is 20% stenosed.   Mid LM lesion is 10% stenosed.   Prox LAD lesion is 20% stenosed.   Mid Cx lesion is 20% stenosed.   There is severe aortic valve stenosis. Mild coronary calcification with nonobstructive disease with smooth 10% distal left main stenosis, 20% proximal LAD stenosis, 20% AV groove circumflex stenosis, and 20% RCA stenosis. Moderate right heart pressure elevation with moderate pulmonary hypertension with mean PA pressure 35 mmHg. Severe aortic stenosis with a mean gradient 28.2, peak to peak gradient 33 mmHg, and calculated AVA 0.9 cm. RECOMMENDATION: Patient will be treated with anticoagulation with his atrial flutter/fibrillation with probable attempted cardioversion. Ultimately will need structural heart team evaluation for aortic valve replacement.   DG Chest 2 View  Result Date: 04/26/2022 CLINICAL DATA:  Atrial fibrillation, tachycardia. EXAM: CHEST - 2 VIEW COMPARISON:  November 04, 2021 FINDINGS: The heart size and mediastinal contours are within normal limits. Both lungs are clear. Chronic elevation of right hemidiaphragm unchanged. The visualized skeletal structures are stable. IMPRESSION: No active cardiopulmonary disease. Electronically Signed   By: Abelardo Diesel M.D.   On: 04/26/2022 13:49    Cardiac Studies   Reviewed images of TEE and coronary angiography.  Patient Profile     67 y.o. male with severe calcific aortic stenosis presenting with atrial flutter with rapid ventricular response and congestive heart failure exacerbation.  Has nonobstructive CAD by angiography, no evidence of aortic root enlargement by TEE.  Depressed left ventricular systolic function likely due to severe aortic stenosis and superimposed tachycardia cardiomyopathy.  Successfully underwent cardioversion today.  Additional  problems include CKD stage IIIb type 2 diabetes mellitus and hypercholesterolemia.  Assessment & Plan    He is a good candidate for TAVR based on the absence of CAD or aortic root abnormalities, but will need evaluation  for femoral access with a CT angiogram. He has diabetes mellitus and CKD stage III, but his creatinine appears to be close to baseline.  Try to get his CT done today and then refer him as an outpatient for evaluation by the structural heart team. Plan to continue amiodarone 400 mg twice daily for a week, then 400 mg once daily until follow-up.  Transition from heparin to apixaban 5 mg twice daily. Ideally will transition from glipizide to SGLT2 inhibitor.  He was on Farxiga in the past but stopped due to cost.  Will try to restart it and if possible sign him up for the pharmaceutical manufacturer assistance program. Similarly, may need assistance with Eliquis prescription as well.  For questions or updates, please contact [place] Please consult [email or web address] for contact info under        Signed, Sanda Klein, MD  04/28/2022, 9:26 AM

## 2022-04-28 NOTE — Discharge Instructions (Signed)

## 2022-04-28 NOTE — Anesthesia Postprocedure Evaluation (Signed)
Anesthesia Post Note  Patient: Thomas [name]  Procedure(s) Performed: TRANSESOPHAGEAL ECHOCARDIOGRAM (TEE) CARDIOVERSION     Patient location during evaluation: PACU Anesthesia Type: MAC Level of consciousness: awake and alert Pain management: pain level controlled Vital Signs Assessment: post-procedure vital signs reviewed and stable Respiratory status: spontaneous breathing, nonlabored ventilation, respiratory function stable and patient connected to nasal cannula oxygen Cardiovascular status: stable and blood pressure returned to baseline Postop Assessment: no apparent nausea or vomiting Anesthetic complications: no  No notable events documented.  Last Vitals:  Vitals:   04/28/22 0855 04/28/22 0904  BP: 102/62 (!) 98/59  Pulse: 67 66  Resp: 14 14  Temp:    SpO2: 96% 97%    Last Pain:  Vitals:   04/28/22 0904  TempSrc:   PainSc: 0-No pain                 Barnet Glasgow

## 2022-04-28 NOTE — Discharge Summary (Signed)
Discharge Summary    Patient ID: Thomas [name] MRN: [id number]; DOB: 03/23/1956  Admit date: 04/26/2022 Discharge date: 04/28/2022  PCP:  Vivi Barrack, [place] Prov[facility] Cardiologist:  Mertie Moores, MD   Discharge Diagnoses    Principal Problem:   Atrial flutter [facility]) Active Problems:   Atrial fibrillation with RVR [facility])    Diagnostic Studies/Procedures   Right and left heart cath 04/27/22:   Mid RCA lesion is 20% stenosed.   Mid LM lesion is 10% stenosed.   Prox LAD lesion is 20% stenosed.   Mid Cx lesion is 20% stenosed.   There is severe aortic valve stenosis.   Mild coronary calcification with nonobstructive disease with smooth 10% distal left main stenosis, 20% proximal LAD stenosis, 20% AV groove circumflex stenosis, and 20% RCA stenosis.   Moderate right heart pressure elevation with moderate pulmonary hypertension with mean PA pressure 35 mmHg.   Severe aortic stenosis with a mean gradient 28.2, peak to peak gradient 33 mmHg, and calculated AVA 0.9 cm.   RECOMMENDATION: Patient will be treated with anticoagulation with his atrial flutter/fibrillation with probable attempted cardioversion. Ultimately will need structural heart team evaluation for aortic valve replacement.    TEE 04/28/22 pending final read   CT coronary 04/28/22: IMPRESSION: 1. Tricuspid aortic valve with severely reduced cusp excursion. Severely thickened and severely calcified aortic valve cusps. 2. Aortic valve calcium score: 5671 3. Annulus area: 476 mm2, suitable for 26 mm Sapien 3 valve. Trivial LVOT calcifications. Membranous septal length 6 mm. 4. Sufficient coronary artery heights from annulus. 5. Optimum fluoroscopic angle for delivery: LAO 0 CAU 0 _____________  Echo 04/26/22:  1. Global hypokinesis worse in inferior wall . Left ventricular ejection  fraction, by estimation, is 40 to 45%. The left ventricle has mildly  decreased function. The left  ventricle demonstrates global hypokinesis.  The left ventricular internal cavity  size was moderately dilated. There is mild left ventricular hypertrophy.  Left ventricular diastolic parameters are consistent with Grade II  diastolic dysfunction (pseudonormalization). Elevated left ventricular  end-diastolic pressure.   2. Right ventricular systolic function is normal. The right ventricular  size is normal.   3. Left atrial size was moderately dilated.   4. The mitral valve is abnormal. Mild mitral valve regurgitation. No  evidence of mitral stenosis. Moderate mitral annular calcification.   5. AS remains severe Gradients low and DVI higher than TTE done 10/15/21  but EF has dropped considerably . The aortic valve is tricuspid. There is  severe calcifcation of the aortic valve. There is severe thickening of the  aortic valve. Aortic valve  regurgitation is mild. No aortic stenosis is present.   6. Aortic dilatation noted. There is mild dilatation of the aortic root,  measuring 40 mm. There is mild dilatation of the ascending aorta,  measuring 39 mm.   7. The inferior vena cava is normal in size with greater than 50%  respiratory variability, suggesting right atrial pressure of 3 mmHg.    History of Present Illness     Thomas [name] is a 67 y.o. male with a history of DM 2, hyperlipidemia, hypertension, aortic stenosis, and stroke.  He was seen in outpatient clinic on 04/17/2022 with lower extremity edema, weight gain, shortness of breath, and worsening chest pain.  EKG showed atrial flutter with RVR.  Given his volume overload and RVR, he was sent to the ER for better management.  Hospital Course  Consultants: structural heart team  Atrial flutter-new diagnosis this admission He was noted to be in RVR 2:1 conduction with hypotension on arrival.  Due to pressure, he was started on IV amiodarone with improvement in his rates to the 90s and atrial flutter with 3-1 conduction.  He was  started on heparin IV due to possible need for right and left heart catheterization during this admission given his history of aortic stenosis.  He underwent TEE-cardioversion today with conversion back to sinus rhythm. Will discharge on an amiodarone taper. He is anticoagulated with eliquis 5 mg BID x 4 weeks post cardioversion.    Aortic stenosis - severe Transthoracic echocardiogram suggests severe AS with low gradient. Underwent TEE and CT scans today. Will be evaluated by both CT surgery and structural heart team to determine best path to AVR. May be a good candidate for TAVR in the absence of aortic root aneurysm and coronary artery disease.    Acute systolic and diastolic heart failure Transthoracic echocardiogram performed in atrial flutter with final read summarizing LVEF 99-37%, grade 2 diastolic dysfunction mild MR, moderate MAC, and aortic stenosis as listed above. However, EF may be closer to 25-30% on further review. TEE read as 35-40%. Suspect this will improve in sinus rhythm. Caution with diuretics given severe aortic stenosis. GDMT complicated by hypotension. Consider adding SGLT2i outpatient - may need assistance as eliquis will already be $47. Wilder Glade and jardiance would both be an additional $47 copay.  Apparently was on farxiga in the past but stopped due to cost.    Nonobstructive CAD Did not add ASA in the setting of eliquis. Focus on risk factor reduction. No chest pain.   Hyperlipidemia with LDL goal < 70 04/27/2022: Cholesterol 91; HDL 30; LDL Cholesterol 46; Triglycerides 77; VLDL 15 LP (a) still in process Changed zocor to 10 mg crestor in the setting of amiodarone.   Pt was seen and examined by Dr. Sallyanne Kuster and deemed stable for discharge. Follow up has been arranged.    Did the patient have an acute coronary syndrome (MI, NSTEMI, STEMI, etc) this admission?:  No                               Did the patient have a percutaneous coronary intervention (stent /  angioplasty)?:  No.        The patient will be scheduled for a TOC follow up appointment in 7-14 days.  A message has been sent to the [facility] and Scheduling Pool at the office where the patient should be seen for follow up.  _____________  Discharge Vitals Blood pressure 115/70, pulse 74, temperature (!) 97.2 F (36.2 C), temperature source Oral, resp. rate 16, height 6' (1.829 m), weight 100.7 kg, SpO2 95 %.  Filed Weights   04/27/22 1747 04/28/22 0731  Weight: 100.7 kg 100.7 kg    Labs & Radiologic Studies    CBC Recent Labs    04/27/22 0334 04/27/22 1618 04/27/22 1628 04/28/22 0542  WBC 7.3  --   --  7.7  HGB 14.0   < > 13.9 14.8  HCT 42.2   < > 41.0 43.2  MCV 92.3  --   --  89.8  PLT 223  --   --  219   < > = values in this interval not displayed.   Basic Metabolic Panel Recent Labs    04/27/22 0334 04/27/22 1618 04/27/22 1628 04/28/22 0542  NA 137   < > 138 135  K 3.9   < > 4.1 4.0  CL 103  --   --  102  CO2 24  --   --  21*  GLUCOSE 123*  --   --  126*  BUN 24*  --   --  18  CREATININE 1.49*  --   --  1.57*  CALCIUM 8.9  --   --  8.8*  MG 1.5*  --   --  1.6*   < > = values in this interval not displayed.   Liver Function Tests No results for input(s): "AST", "ALT", "ALKPHOS", "BILITOT", "PROT", "ALBUMIN" in the last 72 hours. No results for input(s): "LIPASE", "AMYLASE" in the last 72 hours. High Sensitivity Troponin:   No results for input(s): "TROPONINIHS" in the last 720 hours.  BNP Invalid input(s): "POCBNP" D-Dimer No results for input(s): "DDIMER" in the last 72 hours. Hemoglobin A1C Recent Labs    04/27/22 0334  HGBA1C 6.8*   Fasting Lipid Panel Recent Labs    04/27/22 0334  CHOL 91  HDL 30*  LDLCALC 46  TRIG 77  CHOLHDL 3.0   Thyroid Function Tests Recent Labs    04/27/22 0334  TSH 0.361   _____________  CT Angio Abd/Pel w/ and/or w/o  Result Date: 04/28/2022 CLINICAL DATA:  Preop evaluation for aortic valve  replacement EXAM: CT ANGIOGRAPHY CHEST, ABDOMEN AND PELVIS TECHNIQUE: Multidetector CT imaging through the chest, abdomen and pelvis was performed using the standard protocol during bolus administration of intravenous contrast. Multiplanar reconstructed images and MIPs were obtained and reviewed to evaluate the vascular anatomy. RADIATION DOSE REDUCTION: This exam was performed according to the departmental dose-optimization program which includes automated exposure control, adjustment of the mA and/or kV according to patient size and/or use of iterative reconstruction technique. CONTRAST:  130m OMNIPAQUE IOHEXOL 350 MG/ML SOLN COMPARISON:  None Available. FINDINGS: CTA CHEST FINDINGS Cardiovascular: Normal heart size. No pericardial effusion. Normal caliber thoracic aorta with mild atherosclerotic disease. Standard three-vessel aortic arch with no significant stenosis. Aortic valve thickening and calcifications. Left main and 3 vessel coronary artery disease. Mediastinum/Nodes: Mildly patulous esophagus. Thyroid is unremarkable. Mildly enlarged mediastinal and right hilar lymph nodes, likely reactive. Reference subcarinal lymph node measuring 1.2 cm in short axis on series 4, image 97. Reference right perihilar lymph node measuring 1.0 cm in short axis on series 4, image 94 Lungs/Pleura: Central airways are patent. Mild smooth septal thickening and diffuse ground-glass opacities. Right basilar atelectasis. No pleural effusion or pneumothorax. Musculoskeletal: Orthopedic hardware of the cervical spine. No chest wall abnormality. No acute or significant osseous findings. CTA ABDOMEN AND PELVIS FINDINGS Hepatobiliary: No focal liver abnormality is seen. No gallstones, gallbladder wall thickening, or biliary dilatation. Pancreas: Unremarkable. No pancreatic ductal dilatation or surrounding inflammatory changes. Spleen: Normal in size without focal abnormality. Adrenals/Urinary Tract: Bilateral adrenal glands are  unremarkable. No hydronephrosis nephrolithiasis. Bilateral renal scarring. Simple appearing cyst of the mid region of the right kidney, no specific follow-up imaging is recommended. Bladder is unremarkable. Stomach/Bowel: Stomach is within normal limits. Appendix appears normal. Circumferential wall thickening of the distal right colon seen on series 4, image 194. large rectal diverticulum versus evolving area of fat necrosis seen on series 4 image 231. No evidence of obstruction. Vascular/lymphatic: Normal caliber abdominal aorta with mild atherosclerotic disease and no significant stenosis. No pathologically enlarged lymph nodes seen in the abdomen or pelvis. Reproductive: Prostate is unremarkable. Other: Fat containing structures of the  left hemiabdomen on series 4, image 206, and series 4 image 178, consistent with fat necrosis. No abdominal wall hernia or abnormality. No abdominopelvic ascites. Musculoskeletal: No acute or significant osseous findings. VASCULAR MEASUREMENTS PERTINENT TO TAVR: AORTA: Minimal Aortic Diameter-14.2 mm Severity of Aortic Calcification-mild RIGHT PELVIS: Right Common Iliac Artery - Minimal Diameter-7.9 mm Tortuosity-none Calcification-mild Right External Iliac Artery - Minimal Diameter-7.8 mm Tortuosity-none Calcification-none Right Common Femoral Artery - Minimal Diameter-5.6 mm Tortuosity-none Calcification-moderate LEFT PELVIS: Left Common Iliac Artery - Minimal Diameter-7.8 mm Tortuosity-none Calcification-moderate Left External Iliac Artery - Minimal Diameter-7.4 mm Tortuosity-none Calcification-none Left Common Femoral Artery - Minimal Diameter-7.6 mm Tortuosity-none Calcification-none Review of the MIP images confirms the above findings. IMPRESSION: 1. Vascular findings and measurements pertinent to potential TAVR procedure, as detailed above. 2. Thickening and calcification of the aortic valve, compatible with reported clinical history of aortic stenosis. 3. Mild-to-moderate  aortoiliac atherosclerosis. Left main and 3 vessel coronary artery disease. 4. Circumferential wall thickening of the distal right colon, possibly due to decompression, although neoplasm could have a similar appearance. Recommend further evaluation with colonoscopy. 5. Mild pulmonary edema. Electronically Signed   By: Yetta Glassman M.D.   On: 04/28/2022 12:32   CT ANGIO CHEST AORTA W/CM & OR WO/CM  Result Date: 04/28/2022 CLINICAL DATA:  Preop evaluation for aortic valve replacement EXAM: CT ANGIOGRAPHY CHEST, ABDOMEN AND PELVIS TECHNIQUE: Multidetector CT imaging through the chest, abdomen and pelvis was performed using the standard protocol during bolus administration of intravenous contrast. Multiplanar reconstructed images and MIPs were obtained and reviewed to evaluate the vascular anatomy. RADIATION DOSE REDUCTION: This exam was performed according to the departmental dose-optimization program which includes automated exposure control, adjustment of the mA and/or kV according to patient size and/or use of iterative reconstruction technique. CONTRAST:  180m OMNIPAQUE IOHEXOL 350 MG/ML SOLN COMPARISON:  None Available. FINDINGS: CTA CHEST FINDINGS Cardiovascular: Normal heart size. No pericardial effusion. Normal caliber thoracic aorta with mild atherosclerotic disease. Standard three-vessel aortic arch with no significant stenosis. Aortic valve thickening and calcifications. Left main and 3 vessel coronary artery disease. Mediastinum/Nodes: Mildly patulous esophagus. Thyroid is unremarkable. Mildly enlarged mediastinal and right hilar lymph nodes, likely reactive. Reference subcarinal lymph node measuring 1.2 cm in short axis on series 4, image 97. Reference right perihilar lymph node measuring 1.0 cm in short axis on series 4, image 94 Lungs/Pleura: Central airways are patent. Mild smooth septal thickening and diffuse ground-glass opacities. Right basilar atelectasis. No pleural effusion or  pneumothorax. Musculoskeletal: Orthopedic hardware of the cervical spine. No chest wall abnormality. No acute or significant osseous findings. CTA ABDOMEN AND PELVIS FINDINGS Hepatobiliary: No focal liver abnormality is seen. No gallstones, gallbladder wall thickening, or biliary dilatation. Pancreas: Unremarkable. No pancreatic ductal dilatation or surrounding inflammatory changes. Spleen: Normal in size without focal abnormality. Adrenals/Urinary Tract: Bilateral adrenal glands are unremarkable. No hydronephrosis nephrolithiasis. Bilateral renal scarring. Simple appearing cyst of the mid region of the right kidney, no specific follow-up imaging is recommended. Bladder is unremarkable. Stomach/Bowel: Stomach is within normal limits. Appendix appears normal. Circumferential wall thickening of the distal right colon seen on series 4, image 194. large rectal diverticulum versus evolving area of fat necrosis seen on series 4 image 231. No evidence of obstruction. Vascular/lymphatic: Normal caliber abdominal aorta with mild atherosclerotic disease and no significant stenosis. No pathologically enlarged lymph nodes seen in the abdomen or pelvis. Reproductive: Prostate is unremarkable. Other: Fat containing structures of the left hemiabdomen on series 4, image 206, and series 4  image 178, consistent with fat necrosis. No abdominal wall hernia or abnormality. No abdominopelvic ascites. Musculoskeletal: No acute or significant osseous findings. VASCULAR MEASUREMENTS PERTINENT TO TAVR: AORTA: Minimal Aortic Diameter-14.2 mm Severity of Aortic Calcification-mild RIGHT PELVIS: Right Common Iliac Artery - Minimal Diameter-7.9 mm Tortuosity-none Calcification-mild Right External Iliac Artery - Minimal Diameter-7.8 mm Tortuosity-none Calcification-none Right Common Femoral Artery - Minimal Diameter-5.6 mm Tortuosity-none Calcification-moderate LEFT PELVIS: Left Common Iliac Artery - Minimal Diameter-7.8 mm Tortuosity-none  Calcification-moderate Left External Iliac Artery - Minimal Diameter-7.4 mm Tortuosity-none Calcification-none Left Common Femoral Artery - Minimal Diameter-7.6 mm Tortuosity-none Calcification-none Review of the MIP images confirms the above findings. IMPRESSION: 1. Vascular findings and measurements pertinent to potential TAVR procedure, as detailed above. 2. Thickening and calcification of the aortic valve, compatible with reported clinical history of aortic stenosis. 3. Mild-to-moderate aortoiliac atherosclerosis. Left main and 3 vessel coronary artery disease. 4. Circumferential wall thickening of the distal right colon, possibly due to decompression, although neoplasm could have a similar appearance. Recommend further evaluation with colonoscopy. 5. Mild pulmonary edema. Electronically Signed   By: Yetta Glassman M.D.   On: 04/28/2022 12:32   CT CORONARY MORPH W/CTA COR W/SCORE W/CA W/CM &/OR WO/CM  Addendum Date: 04/28/2022   ADDENDUM REPORT: 04/28/2022 11:47 EXAM: OVER-READ INTERPRETATION  CT CHEST The following report is an over-read performed by radiologist Dr. Maudry [facility] on 04/28/2022. This over-read does not include interpretation of cardiac or coronary anatomy or pathology. The cardiac TAVR interpretation by the cardiologist is attached. COMPARISON:  None. FINDINGS: Extracardiac findings will be described separately under dictation for contemporaneously obtained CTA chest, abdomen and pelvis. IMPRESSION: Please see separate dictation for contemporaneously obtained CTA chest, abdomen and pelvis dated 04/28/2022 for full description of relevant extracardiac findings. Electronically Signed   By: Yetta Glassman M.D.   On: 04/28/2022 11:47   Result Date: 04/28/2022 CLINICAL DATA:  Aortic valve replacement (TAVR), pre-op eval, severe aortic stenosis EXAM: Cardiac TAVR CT TECHNIQUE: The patient was scanned on a Siemens Force 119 slice scanner. A 120 kV retrospective  scan was triggered in the descending thoracic aorta at 111 HU's. Gantry rotation speed was 270 msecs and collimation was .9 mm. The 3D data set was reconstructed in 5% intervals of the R-R cycle. Systolic and diastolic phases were analyzed on a dedicated work station using MPR, MIP and VRT modes. The patient received 137m OMNIPAQUE IOHEXOL 350 MG/ML SOLN of contrast. FINDINGS: Image quality: Good Aortic Valve: Tricuspid aortic valve with severely reduced cusp excursion. Severely thickened and severely calcified aortic valve cusps. AV calcium score: 5671 Virtual Basal Annulus Measurements: Maximum/Minimum Diameter: 29.1 x 22.5 mm Perimeter: 79.3 mm Area:  476 mm2 Trivial LVOT calcifications. Membranous septal length: 6.4 mm, above virtual annulus Based on these measurements, the annulus would be suitable for a 26 mm Sapien 3 valve. Alternatively, Heart Team can consider 29 mm Evolut valve. Recommend Heart Team discussion for valve selection. Sinus of Valsalva Measurements: Non-coronary:  36 mm Right - coronary:  36 mm Left - coronary:  37 mm Sinus of Valsalva Height: Left: 27.7 mm Right: 27.9 mm Aorta: Conventional 3 vessel branch pattern of aortic arch. No coarctation of the aorta. Sinotubular Junction:  37 mm Ascending Thoracic Aorta:  39 mm Aortic Arch:  28 mm Descending Thoracic Aorta:  25 mm Coronary Artery Height above Annulus: Left main: 21.1 mm Right coronary: 21 mm Coronary Arteries: Normal coronary origin. Right dominance. The study was performed without use of NTG and  insufficient for plaque evaluation. Coronary artery calcium seen in 3 vessel distribution. RCA origin at the level of the ST junction. Optimum Fluoroscopic Angle for Delivery: LAO 0 CAU 0 OTHER: Atria: Left atrial appendage: Contrast mixing with incomplete opacification. Mitral valve: Grossly normal, trivial mitral annular calcifications. Pulmonary artery: Mild dilation, 34 mm. Pulmonary veins: Normal anatomy. IMPRESSION: 1. Tricuspid aortic  valve with severely reduced cusp excursion. Severely thickened and severely calcified aortic valve cusps. 2. Aortic valve calcium score: 5671 3. Annulus area: 476 mm2, suitable for 26 mm Sapien 3 valve. Trivial LVOT calcifications. Membranous septal length 6 mm. 4. Sufficient coronary artery heights from annulus. 5. Optimum fluoroscopic angle for delivery: LAO 0 CAU 0 Electronically Signed: By: Cherlynn Kaiser M.D. On: 04/28/2022 11:17   CARDIAC CATHETERIZATION  Result Date: 04/27/2022   Mid RCA lesion is 20% stenosed.   Mid LM lesion is 10% stenosed.   Prox LAD lesion is 20% stenosed.   Mid Cx lesion is 20% stenosed.   There is severe aortic valve stenosis. Mild coronary calcification with nonobstructive disease with smooth 10% distal left main stenosis, 20% proximal LAD stenosis, 20% AV groove circumflex stenosis, and 20% RCA stenosis. Moderate right heart pressure elevation with moderate pulmonary hypertension with mean PA pressure 35 mmHg. Severe aortic stenosis with a mean gradient 28.2, peak to peak gradient 33 mmHg, and calculated AVA 0.9 cm. RECOMMENDATION: Patient will be treated with anticoagulation with his atrial flutter/fibrillation with probable attempted cardioversion. Ultimately will need structural heart team evaluation for aortic valve replacement.   DG Chest 2 View  Result Date: 04/26/2022 CLINICAL DATA:  Atrial fibrillation, tachycardia. EXAM: CHEST - 2 VIEW COMPARISON:  November 04, 2021 FINDINGS: The heart size and mediastinal contours are within normal limits. Both lungs are clear. Chronic elevation of right hemidiaphragm unchanged. The visualized skeletal structures are stable. IMPRESSION: No active cardiopulmonary disease. Electronically Signed   By: Abelardo Diesel M.D.   On: 04/26/2022 13:49   ECHOCARDIOGRAM COMPLETE  Result Date: 04/26/2022    ECHOCARDIOGRAM REPORT   Patient Name:   AHMIR BRACKEN Date of Exam: 04/26/2022 Medical Rec #:  [id number]       Height:       72.0 in  Accession #:    [id number]      Weight:       214.0 lb Date of Birth:  1955/04/17       BSA:          2.193 m Patient Age:    30 years        BP:           112/81 mmHg Patient Gender: M               HR:           126 bpm. Exam Location:  [place] Procedure: 2D Echo, Cardiac Doppler and Color Doppler Indications:    I35.0 Aortic Stenosis  History:        Patient has prior history of Echocardiogram examinations, most                 recent 10/15/2021. Stroke; Risk Factors:Hypertension, Diabetes                 and HLD.  Sonographer:    Marygrace Drought RCS Referring Phys: [place]  1. Global hypokinesis worse in inferior wall . Left ventricular ejection fraction, by estimation, is 40 to 45%. The left ventricle has mildly decreased  function. The left ventricle demonstrates global hypokinesis. The left ventricular internal cavity size was moderately dilated. There is mild left ventricular hypertrophy. Left ventricular diastolic parameters are consistent with Grade II diastolic dysfunction (pseudonormalization). Elevated left ventricular end-diastolic pressure.  2. Right ventricular systolic function is normal. The right ventricular size is normal.  3. Left atrial size was moderately dilated.  4. The mitral valve is abnormal. Mild mitral valve regurgitation. No evidence of mitral stenosis. Moderate mitral annular calcification.  5. AS remains severe Gradients low and DVI higher than TTE done 10/15/21 but EF has dropped considerably . The aortic valve is tricuspid. There is severe calcifcation of the aortic valve. There is severe thickening of the aortic valve. Aortic valve regurgitation is mild. No aortic stenosis is present.  6. Aortic dilatation noted. There is mild dilatation of the aortic root, measuring 40 mm. There is mild dilatation of the ascending aorta, measuring 39 mm.  7. The inferior vena cava is normal in size with greater than 50% respiratory variability, suggesting right  atrial pressure of 3 mmHg. FINDINGS  Left Ventricle: Global hypokinesis worse in inferior wall. Left ventricular ejection fraction, by estimation, is 40 to 45%. The left ventricle has mildly decreased function. The left ventricle demonstrates global hypokinesis. The left ventricular internal cavity size was moderately dilated. There is mild left ventricular hypertrophy. Left ventricular diastolic parameters are consistent with Grade II diastolic dysfunction (pseudonormalization). Elevated left ventricular end-diastolic pressure. Right Ventricle: The right ventricular size is normal. No increase in right ventricular wall thickness. Right ventricular systolic function is normal. Left Atrium: Left atrial size was moderately dilated. Right Atrium: Right atrial size was normal in size. Pericardium: There is no evidence of pericardial effusion. Mitral Valve: The mitral valve is abnormal. There is moderate thickening of the mitral valve leaflet(s). There is moderate calcification of the mitral valve leaflet(s). Moderate mitral annular calcification. Mild mitral valve regurgitation. No evidence of mitral valve stenosis. Tricuspid Valve: The tricuspid valve is normal in structure. Tricuspid valve regurgitation is not demonstrated. No evidence of tricuspid stenosis. Aortic Valve: AS remains severe Gradients low and DVI higher than TTE done 10/15/21 but EF has dropped considerably. The aortic valve is tricuspid. There is severe calcifcation of the aortic valve. There is severe thickening of the aortic valve. Aortic valve regurgitation is mild. Aortic regurgitation PHT measures 198 msec. No aortic stenosis is present. Aortic valve mean gradient measures 24.0 mmHg. Aortic valve peak gradient measures 39.2 mmHg. Aortic valve area, by VTI measures 0.93 cm. Pulmonic Valve: The pulmonic valve was normal in structure. Pulmonic valve regurgitation is not visualized. No evidence of pulmonic stenosis. Aorta: Aortic dilatation noted.  There is mild dilatation of the aortic root, measuring 40 mm. There is mild dilatation of the ascending aorta, measuring 39 mm. Venous: The inferior vena cava is normal in size with greater than 50% respiratory variability, suggesting right atrial pressure of 3 mmHg. IAS/Shunts: No atrial level shunt detected by color flow Doppler.  LEFT VENTRICLE PLAX 2D LVIDd:         4.90 cm   Diastology LVIDs:         4.00 cm   LV e' medial:    5.77 cm/s LV PW:         1.40 cm   LV E/e' medial:  25.3 LV IVS:        1.20 cm   LV e' lateral:   9.57 cm/s LVOT diam:     1.90 cm   LV  E/e' lateral: 15.3 LV SV:         55 LV SV Index:   25 LVOT Area:     2.84 cm  RIGHT VENTRICLE RV Basal diam:  2.90 cm RV S prime:     9.57 cm/s TAPSE (M-mode): 1.6 cm LEFT ATRIUM             Index        RIGHT ATRIUM           Index LA diam:        4.30 cm 1.96 cm/m   RA Area:     12.20 cm LA Vol (A2C):   45.7 ml 20.84 ml/m  RA Volume:   27.70 ml  12.63 ml/m LA Vol (A4C):   60.6 ml 27.64 ml/m LA Biplane Vol: 54.7 ml 24.95 ml/m  AORTIC VALVE AV Area (Vmax):    0.86 cm AV Area (Vmean):   0.81 cm AV Area (VTI):     0.93 cm AV Vmax:           313.00 cm/s AV Vmean:          233.000 cm/s AV VTI:            0.589 m AV Peak Grad:      39.2 mmHg AV Mean Grad:      24.0 mmHg LVOT Vmax:         95.30 cm/s LVOT Vmean:        66.333 cm/s LVOT VTI:          0.193 m LVOT/AV VTI ratio: 0.33 AI PHT:            198 msec  AORTA Ao Root diam: 4.00 cm Ao Asc diam:  3.90 cm MITRAL VALVE MV Area (PHT):              SHUNTS MV Decel Time:              Systemic VTI:  0.19 m MV E velocity: 146.00 cm/s  Systemic Diam: 1.90 cm Jenkins Rouge MD Electronically signed by Jenkins Rouge MD Signature Date/Time: 04/26/2022/10:48:02 AM    Final    Disposition   Pt is being discharged home today in good condition.  Follow-up Plans & Appointments     Follow-up Information     Liliane Shi, PA-C Follow up on 05/12/2022.   Specialties: Cardiology, Physician Assistant Why:  10:05 for hospital follow up Contact information: [address] [place] [address] [phone]                Discharge Instructions     Diet - low sodium heart healthy   Complete by: As directed    Discharge instructions   Complete by: As directed    No driving for 2 days. No lifting over 5 lbs for 1 week. No sexual activity for 1 week.  Keep procedure site clean & dry. If you notice increased pain, swelling, bleeding or pus, call/return!  You may shower, but no soaking baths/hot tubs/pools for 1 week.   Increase activity slowly   Complete by: As directed         Discharge Medications   Allergies as of 04/28/2022       Reactions   Trulicity [dulaglutide] Hives   Januvia [sitagliptin] Other (See Comments)   Red eyes   Other    Stomach medicine, not sure of name. Had a mini stroke   Penicillins Swelling   Arm swelling Has  patient had a PCN reaction causing immediate rash, facial/tongue/throat swelling, SOB or lightheadedness with hypotension: No Has patient had a PCN reaction causing severe rash involving mucus membranes or skin necrosis: No Has patient had a PCN reaction that required hospitalization No Has patient had a PCN reaction occurring within the last 10 years: No If all of the above answers are "NO", then may proceed with Cephalosporin use.        Medication List     STOP taking these medications    lisinopril 10 MG tablet Commonly known as: ZESTRIL   nortriptyline 50 MG capsule Commonly known as: PAMELOR   simvastatin 40 MG tablet Commonly known as: ZOCOR       TAKE these medications    amiodarone 200 MG tablet Commonly known as: PACERONE Take 400 mg (2 tablets) twice daily for 7 days, then take 400 mg (2 tablets) daily for 7 days, then take 200 mg (1 tablet) daily thereafter.   apixaban 5 MG Tabs tablet Commonly known as: ELIQUIS Take 1 tablet (5 mg total) by mouth 2 (two) times daily.   Apple Cider Vinegar  500 MG Tabs Take 500 mg by mouth at bedtime.   diclofenac sodium 1 % Gel Commonly known as: VOLTAREN Apply 1 Application topically 4 (four) times daily as needed (pain).   Fluzone High-Dose Quadrivalent 0.7 ML Susy Generic drug: Influenza Vac High-Dose Quad Inject 0.5 mLs into the muscle once.   gabapentin 400 MG capsule Commonly known as: NEURONTIN Take 400 mg by mouth 3 (three) times daily.   GINGER PO Take 1 tablet by mouth daily.   glimepiride 4 MG tablet Commonly known as: AMARYL Take 1 tablet (4 mg total) by mouth daily with breakfast.   HYDROmorphone 2 MG tablet Commonly known as: DILAUDID Take 2 mg by mouth 4 (four) times daily as needed for severe pain.   levothyroxine 150 MCG tablet Commonly known as: SYNTHROID Take 1 tablet (150 mcg total) by mouth daily before breakfast.   metFORMIN 1000 MG tablet Commonly known as: GLUCOPHAGE TAKE 1 TABLET (1,000 MG TOTAL) BY MOUTH TWICE A DAY WITH FOOD.  May restart on 05/01/22. What changed: additional instructions   OneTouch Delica Plus [id number] Misc CHECK BLOOD SUGAR TWICE DAILY   OneTouch Verio test strip Generic drug: glucose blood daily.   QC Tumeric Complex 500 MG Caps Generic drug: Turmeric Take 500 mg by mouth at bedtime.   rosuvastatin 10 MG tablet Commonly known as: Crestor Take 1 tablet (10 mg total) by mouth daily.           Outstanding Labs/Studies   none  Duration of Discharge Encounter   Greater than 30 minutes including physician time.  Signed, Tami Lin Renetta Suman, PA 04/28/2022, 4:52 PM

## 2022-04-28 NOTE — Progress Notes (Addendum)
   Heart Failure Stewardship Pharmacist Progress Note   PCP: Vivi Barrack, MD PCP-Cardiologist: Mertie Moores, MD    HPI:  67 yo M with PMH of T2DM, HLD, HTN, and stroke.  He was seen by outpatient cardiologist on 1/29 with LE edema, weight gain, shortness of breath, and worsening chest pain. EKG showed aflutter with RVR. Sent to the ED for diuresis and possible TEE/DCCV. ECHO 1/29 showed LVEF 40-45% (was 60-65% in 09/2021), global hypokinesis, mild LVH, G2DD, RV normal, severe AS. CXR clear. Started on amiodarone with worsening LV function. R/LHC on 1/30 showed mild nonobstructive CAD, severe aortic stenosis, and moderate pulmonary hypertension (RA 10, PA 35, wedge 26, CO 5.7, CI 2.6). S/p successful cardioversion on 1/31.  Current HF Medications: None  Prior to admission HF Medications: ACE/ARB/ARNI: lisinopril 10 mg BID  Pertinent Lab Values: Serum creatinine 1.57, BUN 18, Potassium 4.0, Sodium 135, Magnesium 1.6, A1c 6.8   Vital Signs: Weight: 222 lbs Blood pressure: 90/60-140/80s  Heart rate: 90s (before cardioversion) I/O: -2.2L yesterday; net -2.9L  Medication Assistance / Insurance Benefits Check: Does the patient have prescription insurance?  Yes Type of insurance plan: UHC Medicare  Does the patient qualify for medication assistance through manufacturers or grants? Pending  Eligible grants and/or patient assistance programs: pending Medication assistance applications in progress: none  Medication assistance applications approved: none Approved medication assistance renewals will be completed by: pending  Outpatient Pharmacy:  Prior to admission [facility] Is the patient willing to use [facility] at discharge? Yes Is the patient willing to transition their outpatient pharmacy to utilize a [facility] outpatient pharmacy?   Pending    Assessment: 1. Acute on chronic systolic and diastolic CHF (LVEF 70-96%), due to presumed NICM. NYHA class III  symptoms. - Volume overload on cath yesterday, will likely need additional diuresis but need to be cautious with severe AS being preload dependent. Mg low - recommend replacing with 4g IV x 1 - Consider starting BB once euvolemic and BP improved - Likely won't be able to add ACE/ARB/ARNI until aortic valve fixed - Consider adding MRA and SGLT2i before discharge. Was on Farxiga in the past but stopped due to cost in 2023.    Plan: 1) Medication changes recommended at this time: - Magnesium 4 g IV x 1  2) Patient assistance: - Farxiga copay [phone] (has already used free trial of Farxiga card) - Jardiance copay $47 Delene Loll copay $47  3)  Education  - To be completed prior to discharge  Kerby Nora, PharmD, BCPS Heart Failure Stewardship Pharmacist Phone [phone]

## 2022-04-28 NOTE — Plan of Care (Signed)
  Problem: Education: Goal: Ability to describe self-care measures that may prevent or decrease complications (Diabetes Survival Skills Education) will improve Outcome: Adequate for Discharge Goal: Individualized Educational Video(s) Outcome: Adequate for Discharge   Problem: Coping: Goal: Ability to adjust to condition or change in health will improve Outcome: Adequate for Discharge   Problem: Fluid Volume: Goal: Ability to maintain a balanced intake and output will improve Outcome: Adequate for Discharge   Problem: Health Behavior/Discharge Planning: Goal: Ability to identify and utilize available resources and services will improve Outcome: Adequate for Discharge Goal: Ability to manage health-related needs will improve Outcome: Adequate for Discharge   Problem: Metabolic: Goal: Ability to maintain appropriate glucose levels will improve Outcome: Adequate for Discharge   Problem: Nutritional: Goal: Maintenance of adequate nutrition will improve Outcome: Adequate for Discharge Goal: Progress toward achieving an optimal weight will improve Outcome: Adequate for Discharge   Problem: Skin Integrity: Goal: Risk for impaired skin integrity will decrease Outcome: Adequate for Discharge   Problem: Tissue Perfusion: Goal: Adequacy of tissue perfusion will improve Outcome: Adequate for Discharge   Problem: Education: Goal: Understanding of CV disease, CV risk reduction, and recovery process will improve Outcome: Adequate for Discharge Goal: Individualized Educational Video(s) Outcome: Adequate for Discharge   Problem: Activity: Goal: Ability to return to baseline activity level will improve Outcome: Adequate for Discharge   Problem: Cardiovascular: Goal: Ability to achieve and maintain adequate cardiovascular perfusion will improve Outcome: Adequate for Discharge Goal: Vascular access site(s) Level 0-1 will be maintained Outcome: Adequate for Discharge   Problem: Health  Behavior/Discharge Planning: Goal: Ability to safely manage health-related needs after discharge will improve Outcome: Adequate for Discharge   Problem: Education: Goal: Knowledge of General Education information will improve Description: Including pain rating scale, medication(s)/side effects and non-pharmacologic comfort measures Outcome: Adequate for Discharge   Problem: Health Behavior/Discharge Planning: Goal: Ability to manage health-related needs will improve Outcome: Adequate for Discharge   Problem: Clinical Measurements: Goal: Ability to maintain clinical measurements within normal limits will improve Outcome: Adequate for Discharge Goal: Will remain free from infection Outcome: Adequate for Discharge Goal: Diagnostic test results will improve Outcome: Adequate for Discharge Goal: Respiratory complications will improve Outcome: Adequate for Discharge Goal: Cardiovascular complication will be avoided Outcome: Adequate for Discharge   Problem: Activity: Goal: Risk for activity intolerance will decrease Outcome: Adequate for Discharge   Problem: Nutrition: Goal: Adequate nutrition will be maintained Outcome: Adequate for Discharge   Problem: Coping: Goal: Level of anxiety will decrease Outcome: Adequate for Discharge   Problem: Elimination: Goal: Will not experience complications related to bowel motility Outcome: Adequate for Discharge Goal: Will not experience complications related to urinary retention Outcome: Adequate for Discharge   Problem: Pain Managment: Goal: General experience of comfort will improve Outcome: Adequate for Discharge   Problem: Safety: Goal: Ability to remain free from injury will improve Outcome: Adequate for Discharge   Problem: Skin Integrity: Goal: Risk for impaired skin integrity will decrease Outcome: Adequate for Discharge   

## 2022-04-28 NOTE — Transfer of Care (Signed)
Immediate Anesthesia Transfer of Care Note  Patient: Thomas [name]  Procedure(s) Performed: TRANSESOPHAGEAL ECHOCARDIOGRAM (TEE) CARDIOVERSION  Patient Location: Endoscopy Unit  Anesthesia Type:MAC  Level of Consciousness: drowsy and patient cooperative  Airway & Oxygen Therapy: Patient Spontanous Breathing and Patient connected to nasal cannula oxygen  Post-op Assessment: Report given to RN, Post -op Vital signs reviewed and stable, and Patient moving all extremities X 4  Post vital signs: Reviewed and stable  Last Vitals:  Vitals Value Taken Time  BP 89/66   Temp    Pulse 67   Resp 16   SpO2 91     Last Pain:  Vitals:   04/28/22 0731  TempSrc: Temporal  PainSc: 0-No pain         Complications: No notable events documented.

## 2022-04-28 NOTE — Progress Notes (Addendum)
[place] for heparin to apixaban Indication:  atrial flutter  Allergies  Allergen Reactions   Trulicity [Dulaglutide] Hives   Januvia [Sitagliptin] Other (See Comments)    Red eyes   Other     Stomach medicine, not sure of name. Had a mini stroke   Penicillins Swelling    Arm swelling Has patient had a PCN reaction causing immediate rash, facial/tongue/throat swelling, SOB or lightheadedness with hypotension: No Has patient had a PCN reaction causing severe rash involving mucus membranes or skin necrosis: No Has patient had a PCN reaction that required hospitalization No Has patient had a PCN reaction occurring within the last 10 years: No If all of the above answers are "NO", then may proceed with Cephalosporin use.      Vital Signs: Temp: 97 F (36.1 C) (01/31 0835) Temp Source: Temporal (01/31 0835) BP: 98/59 (01/31 0904) Pulse Rate: 66 (01/31 0904)  Labs: Recent Labs    04/26/22 1238 04/27/22 0334 04/27/22 1000 04/27/22 1618 04/27/22 1619 04/27/22 1628 04/28/22 0542  HGB 14.8 14.0  --    < > 14.3 13.9 14.8  HCT 44.9 42.2  --    < > 42.0 41.0 43.2  PLT 255 223  --   --   --   --  219  HEPARINUNFRC  --  0.44 0.27*  --   --   --   --   CREATININE 1.51* 1.49*  --   --   --   --  1.57*   < > = values in this interval not displayed.     Estimated Creatinine Clearance: 56.1 mL/min (A) (by C-G formula based on SCr of 1.57 mg/dL (H)).   Medical History: Past Medical History:  Diagnosis Date   Aortic stenosis    Cervical myelopathy (HCC)    Diabetes mellitus without complication ([place])    Hypercholesterolemia    Hypertension    Stroke ([place]) 1982   pt states he had a mini stroke in 1982    Assessment: 22 YOM with new Afib RVR not on anticoagulation PTA. Pharmacy consulted to dose heparin.  S/p cath 1/30 and heparin resumed. Pt s/p TEE/DCCV this am with plans to discharge so will transition to apixaban.  Goal of Therapy:   Heparin level 0.3-0.7 units/ml Monitor platelets by anticoagulation protocol: Yes   Plan:  Stop heparin Begin apixaban 5mg  BID  ADDENDUM 1230: Discussed enrollment in pt assistance for Eliquis. Pt would need to spend >/=3% of monthly income on medications to qualify. Pt aware, will determine need over the next couple months depending on new medication expenses.   Arrie Senate, PharmD, BCPS, [facility] Clinical Pharmacist [phone] Please check AMION for all [facility] numbers 04/28/2022

## 2022-04-28 NOTE — Care Management (Signed)
  Transition of Care [facility]) Screening Note   Patient Details  Name: Thomas [name] Date of Birth: 1956/01/12   Transition of Care [facility]) CM/SW Contact:    Bethena Roys, RN Phone Number: 04/28/2022, 3:26 PM    Transition of Care Department [facility]) has reviewed the patient and no TOC needs have been identified at this time. We will continue to monitor patient advancement through interdisciplinary progression rounds. If new patient transition needs arise, please place a TOC consult.

## 2022-04-28 NOTE — Interval H&P Note (Signed)
History and Physical Interval Note:  04/28/2022 7:54 AM  Thomas [name]  has presented today for surgery, with the diagnosis of aflutter.  The various methods of treatment have been discussed with the patient and family. After consideration of risks, benefits and other options for treatment, the patient has consented to  Procedure(s): TRANSESOPHAGEAL ECHOCARDIOGRAM (TEE) (N/A) CARDIOVERSION (N/A) as a surgical intervention.  The patient's history has been reviewed, patient examined, no change in status, stable for surgery.  I have reviewed the patient's chart and labs.  Questions were answered to the patient's satisfaction.     Ramiel Forti Harrell Gave

## 2022-04-28 NOTE — Anesthesia Procedure Notes (Signed)
Procedure Name: MAC Date/Time: 04/28/2022 8:11 AM  Performed by: Darletta Moll, CRNAPre-anesthesia Checklist: Patient identified, Emergency Drugs available, Suction available and Patient being monitored Patient Re-evaluated:Patient Re-evaluated prior to induction Oxygen Delivery Method: Nasal cannula

## 2022-04-29 ENCOUNTER — Telehealth: Payer: Self-pay

## 2022-04-29 ENCOUNTER — Encounter (HOSPITAL_COMMUNITY): Payer: Self-pay | Admitting: Cardiology

## 2022-04-29 LAB — LIPOPROTEIN A (LPA): Lipoprotein (a): 37.3 nmol/L — ABNORMAL HIGH (ref <75.0)

## 2022-04-29 NOTE — Patient Outreach (Signed)
  Care Coordination TOC Note Transition Care Management Unsuccessful Follow-up Telephone Call  Date of discharge and from where:  04/28/22-[place]   Attempts:  1st Attempt  Reason for unsuccessful TCM follow-up call:  Unable to leave message-mailbox full    Enzo Montgomery, RN,BSN,CCM [facility] Telephonic Care Management Coordinator Direct Phone: [phone] Toll Free: [phone] Fax: [phone]

## 2022-04-29 NOTE — Progress Notes (Signed)
Procedure Type: Isolated AVR Perioperative Outcome Estimate % Operative Mortality 2.74% Morbidity & Mortality 11.5% Stroke 1.23% Renal Failure 2.79% Reoperation 3.57% Prolonged Ventilation 6.86% Deep Sternal Wound Infection 0.06% [place] Hospital Stay (>14 days) 6.31% [facility] Hospital Stay (<6 days)* 39.4%

## 2022-04-29 NOTE — Telephone Encounter (Signed)
**Note De-Identified Thomas [name] Obfuscation** Transition Care Management Unsuccessful Follow-up Telephone Call  Date of discharge and from where:  04/28/2022 from [facility]  Attempts:  1st Attempt  Reason for unsuccessful TCM follow-up call:  Voice mail full

## 2022-04-29 NOTE — Telephone Encounter (Signed)
**Note De-Identified Thomas [name] Obfuscation** ----- **Note De-Identified Thomas [name] Obfuscation** Message from Ledora Bottcher, [place] sent at 04/28/2022  4:39 PM EST ----- Pt will need a TOC phone call.   Thanks Angie

## 2022-04-30 ENCOUNTER — Telehealth: Payer: Self-pay

## 2022-04-30 NOTE — Patient Outreach (Signed)
  Care Coordination TOC Note Transition Care Management Follow-up Telephone Call Date of discharge and from where: 04/28/22-[facility] Dx: "Atrial flutter" How have you been since you were released from the hospital? Call completed with spouse who voices patient is "doing great." She voices "he is feeling so much better and they are thankful that his heart was able to get back in "normal ryhtmn." Any questions or concerns? Yes-Spouse said she had questions about Amiodarone dosage and instructions. She called MD office and they were able to explain it to her. She denies any further questions/concerns regarding med and able to verbalize correct instructions on taking med.  Items Reviewed: Did the pt receive and understand the discharge instructions provided? Yes  Medications obtained and verified? Yes  Other? No  Any new allergies since your discharge? No  Dietary orders reviewed? Yes Do you have support at home? Yes   Home Care and Equipment/Supplies: Were home health services ordered? not applicable If so, what is the name of the agency? N/A  Has the agency set up a time to come to the patient's home? not applicable Were any new equipment or medical supplies ordered?  No What is the name of the medical supply agency? N/A Were you able to get the supplies/equipment? not applicable Do you have any questions related to the use of the equipment or supplies? No  Functional Questionnaire: (I = Independent and D = Dependent) ADLs: I  Bathing/Dressing- I  Meal Prep- A  Eating- I  Maintaining continence- I  Transferring/Ambulation- I  Managing Meds- I  Follow up appointments reviewed:  PCP Hospital f/u appt confirmed? No  Specialist Hospital f/u appt confirmed? Yes  Scheduled to see Margaret Pyle on 05/12/22 @ 10:05 am. Are transportation arrangements needed? No  If their condition worsens, is the pt aware to call PCP or go to the Emergency Dept.? Yes Was the patient provided  with contact information for the PCP's office or ED? Yes Was to pt encouraged to call back with questions or concerns? Yes  SDOH assessments and interventions completed:   Yes SDOH Interventions Today    Flowsheet Row Most Recent Value  SDOH Interventions   Transportation Interventions Intervention Not Indicated       Care Coordination Interventions:  Education provided on meds and cardiac mgmt    Encounter Outcome:  Pt. Visit Completed     Enzo Montgomery, RN,BSN,CCM [facility] Telephonic Care Management Coordinator Direct Phone: [phone] Toll Free: [phone] Fax: [phone]

## 2022-04-30 NOTE — Telephone Encounter (Signed)
**Note De-Identified Thomas [name] Obfuscation** Patient contacted regarding discharge from [facility] on 04/28/2022.  Patient understands to follow up with provider Richardson Dopp, PA-c on 05/12/2022 at 10:05 at [address] [place] in [place], [place] [address].  Patient understands discharge instructions? Yes Patient understands medications and regiment? Yes Patient understands to bring all medications to this visit? Yes  Ask patient:  Are you enrolled in My Chart: Yes  The pt states that he is doing well and he denies having any irregular heart beats/rhythm, CP/discomfort, SOB, nausea, vomiting, diaphoresis, dizziness , or lightheadedness.    He verified that he does have [facility] phone number and is aware to call us if he has any questions or concerns. He thanked me for calling him.

## 2022-05-02 ENCOUNTER — Other Ambulatory Visit: Payer: Self-pay | Admitting: Family Medicine

## 2022-05-04 DIAGNOSIS — Z79899 Other long term (current) drug therapy: Secondary | ICD-10-CM | POA: Diagnosis not present

## 2022-05-04 DIAGNOSIS — M25569 Pain in unspecified knee: Secondary | ICD-10-CM | POA: Diagnosis not present

## 2022-05-04 DIAGNOSIS — Z79891 Long term (current) use of opiate analgesic: Secondary | ICD-10-CM | POA: Diagnosis not present

## 2022-05-04 DIAGNOSIS — M961 Postlaminectomy syndrome, not elsewhere classified: Secondary | ICD-10-CM | POA: Diagnosis not present

## 2022-05-04 DIAGNOSIS — M4322 Fusion of spine, cervical region: Secondary | ICD-10-CM | POA: Diagnosis not present

## 2022-05-04 DIAGNOSIS — M542 Cervicalgia: Secondary | ICD-10-CM | POA: Diagnosis not present

## 2022-05-04 DIAGNOSIS — M5451 Vertebrogenic low back pain: Secondary | ICD-10-CM | POA: Diagnosis not present

## 2022-05-04 DIAGNOSIS — G894 Chronic pain syndrome: Secondary | ICD-10-CM | POA: Diagnosis not present

## 2022-05-12 ENCOUNTER — Other Ambulatory Visit (HOSPITAL_COMMUNITY): Payer: Self-pay

## 2022-05-12 ENCOUNTER — Other Ambulatory Visit: Payer: Self-pay | Admitting: *Deleted

## 2022-05-12 ENCOUNTER — Encounter: Payer: Self-pay | Admitting: Cardiology

## 2022-05-12 ENCOUNTER — Ambulatory Visit: Payer: Medicare Other | Attending: Physician Assistant | Admitting: Cardiology

## 2022-05-12 VITALS — BP 104/80 | HR 108 | Ht 72.0 in | Wt 213.2 lb

## 2022-05-12 DIAGNOSIS — I483 Typical atrial flutter: Secondary | ICD-10-CM

## 2022-05-12 DIAGNOSIS — E785 Hyperlipidemia, unspecified: Secondary | ICD-10-CM | POA: Diagnosis not present

## 2022-05-12 DIAGNOSIS — I152 Hypertension secondary to endocrine disorders: Secondary | ICD-10-CM | POA: Diagnosis not present

## 2022-05-12 DIAGNOSIS — I350 Nonrheumatic aortic (valve) stenosis: Secondary | ICD-10-CM | POA: Diagnosis not present

## 2022-05-12 DIAGNOSIS — I2510 Atherosclerotic heart disease of native coronary artery without angina pectoris: Secondary | ICD-10-CM | POA: Diagnosis not present

## 2022-05-12 DIAGNOSIS — E1169 Type 2 diabetes mellitus with other specified complication: Secondary | ICD-10-CM | POA: Diagnosis not present

## 2022-05-12 DIAGNOSIS — I5042 Chronic combined systolic (congestive) and diastolic (congestive) heart failure: Secondary | ICD-10-CM | POA: Diagnosis not present

## 2022-05-12 DIAGNOSIS — R011 Cardiac murmur, unspecified: Secondary | ICD-10-CM

## 2022-05-12 DIAGNOSIS — E1159 Type 2 diabetes mellitus with other circulatory complications: Secondary | ICD-10-CM

## 2022-05-12 MED ORDER — AMIODARONE HCL 200 MG PO TABS: ORAL_TABLET | ORAL | 3 refills | Status: DC

## 2022-05-12 MED ORDER — ROSUVASTATIN CALCIUM 10 MG PO TABS: 10.0000 mg | ORAL_TABLET | Freq: Every day | ORAL | 3 refills | Status: DC

## 2022-05-12 NOTE — Progress Notes (Signed)
Cardiology Office Note:    Date:  05/12/2022   ID:  Thomas [name], DOB February 21, 1956, MRN [id number]  PCP:  Ardith Dark, MD  [facility] Providers Cardiologist:  Kristeen Miss, MD    Referring MD: Ardith Dark, MD   Chief Complaint:  No chief complaint on file.    Patient Profile:  Atrial flutter:  Patient noted to be in atrial flutter during his recent admission and was loaded on Amiodarone with successful DCCV on 04/28/22. He was discharged with taper to complete Amiodarone loading. Anticoagulation with Eliquis 5mg  BID.  Aortic stenosis (severe)  Patient with known aortic stenosis. Found to be worsening during recent admission with decreasing LVEF. Aortic regurgitation PHT measures 198 msec. Aortic valve mean gradient measures 24.0 mmHg. Aortic valve peak gradient measures 39.2 mmHg. Aortic valve area, by VTI measures 0.93 cm. Patient also underwent coronary CTA with findings:  Aortic Valve:   Tricuspid aortic valve with severely reduced cusp excursion. Severely thickened and severely calcified aortic valve cusps.   AV calcium score: 5671   Virtual Basal Annulus Measurements:   Maximum/Minimum Diameter: 29.1 x 22.5 mm   Perimeter: 79.3 mm   Area:  476 mm2   Trivial LVOT calcifications.   Membranous septal length: 6.4 mm, above virtual annulus  Chronic systolic and diastolic CHF  TTE during recent admission with LVEF read as 40-45% (suspected closed to 25-30%), grade II diastolic dysfunction. TEE read LVEF 35-40%.   Nonobstructive CAD  LHC completed during recent hospitalization on 04/27/22. Mild coronary calcification with nonobstructive disease with smooth 10% distal left main stenosis, 20% proximal LAD stenosis, 20% AV groove circumflex stenosis, and 20% RCA stenosis.   Hyperlipidemia  LDL 46 as of 04/27/22, goal <70.  Cardiac Studies & Procedures   CARDIAC CATHETERIZATION  CARDIAC CATHETERIZATION 04/27/2022  Narrative   Mid RCA lesion is  20% stenosed.   Mid LM lesion is 10% stenosed.   Prox LAD lesion is 20% stenosed.   Mid Cx lesion is 20% stenosed.   There is severe aortic valve stenosis.  Mild coronary calcification with nonobstructive disease with smooth 10% distal left main stenosis, 20% proximal LAD stenosis, 20% AV groove circumflex stenosis, and 20% RCA stenosis.  Moderate right heart pressure elevation with moderate pulmonary hypertension with mean PA pressure 35 mmHg.  Severe aortic stenosis with a mean gradient 28.2, peak to peak gradient 33 mmHg, and calculated AVA 0.9 cm.  RECOMMENDATION: Patient will be treated with anticoagulation with his atrial flutter/fibrillation with probable attempted cardioversion. Ultimately will need structural heart team evaluation for aortic valve replacement.  Findings Coronary Findings Diagnostic  Dominance: Right  Left Main Mid LM lesion is 10% stenosed.  Left Anterior Descending Prox LAD lesion is 20% stenosed.  Left Circumflex Mid Cx lesion is 20% stenosed.  Right Coronary Artery Mid RCA lesion is 20% stenosed.  Intervention  No interventions have been documented.     ECHOCARDIOGRAM  ECHOCARDIOGRAM COMPLETE 04/26/2022  Narrative ECHOCARDIOGRAM REPORT    Patient Name:   Thomas [name] Date of Exam: 04/26/2022 Medical Rec #:  04/28/2022       Height:       72.0 in Accession #:    [id number]      Weight:       214.0 lb Date of Birth:  [birth year]       BSA:          2.193 m Patient Age:    67 years  BP:           112/81 mmHg Patient Gender: M               HR:           126 bpm. Exam Location:  [address]  Procedure: 2D Echo, Cardiac Doppler and Color Doppler  Indications:    I35.0 Aortic Stenosis  History:        Patient has prior history of Echocardiogram examinations, most recent 10/15/2021. Stroke; Risk Factors:Hypertension, Diabetes and HLD.  Sonographer:    Clearence Ped RCS Referring Phys: [id number] CHRISTOPHER D  MCALHANY  IMPRESSIONS   1. Global hypokinesis worse in inferior wall . Left ventricular ejection fraction, by estimation, is 40 to 45%. The left ventricle has mildly decreased function. The left ventricle demonstrates global hypokinesis. The left ventricular internal cavity size was moderately dilated. There is mild left ventricular hypertrophy. Left ventricular diastolic parameters are consistent with Grade II diastolic dysfunction (pseudonormalization). Elevated left ventricular end-diastolic pressure. 2. Right ventricular systolic function is normal. The right ventricular size is normal. 3. Left atrial size was moderately dilated. 4. The mitral valve is abnormal. Mild mitral valve regurgitation. No evidence of mitral stenosis. Moderate mitral annular calcification. 5. AS remains severe Gradients low and DVI higher than TTE done 10/15/21 but EF has dropped considerably . The aortic valve is tricuspid. There is severe calcifcation of the aortic valve. There is severe thickening of the aortic valve. Aortic valve regurgitation is mild. No aortic stenosis is present. 6. Aortic dilatation noted. There is mild dilatation of the aortic root, measuring 40 mm. There is mild dilatation of the ascending aorta, measuring 39 mm. 7. The inferior vena cava is normal in size with greater than 50% respiratory variability, suggesting right atrial pressure of 3 mmHg.  FINDINGS Left Ventricle: Global hypokinesis worse in inferior wall. Left ventricular ejection fraction, by estimation, is 40 to 45%. The left ventricle has mildly decreased function. The left ventricle demonstrates global hypokinesis. The left ventricular internal cavity size was moderately dilated. There is mild left ventricular hypertrophy. Left ventricular diastolic parameters are consistent with Grade II diastolic dysfunction (pseudonormalization). Elevated left ventricular end-diastolic pressure.  Right Ventricle: The right ventricular size is  normal. No increase in right ventricular wall thickness. Right ventricular systolic function is normal.  Left Atrium: Left atrial size was moderately dilated.  Right Atrium: Right atrial size was normal in size.  Pericardium: There is no evidence of pericardial effusion.  Mitral Valve: The mitral valve is abnormal. There is moderate thickening of the mitral valve leaflet(s). There is moderate calcification of the mitral valve leaflet(s). Moderate mitral annular calcification. Mild mitral valve regurgitation. No evidence of mitral valve stenosis.  Tricuspid Valve: The tricuspid valve is normal in structure. Tricuspid valve regurgitation is not demonstrated. No evidence of tricuspid stenosis.  Aortic Valve: AS remains severe Gradients low and DVI higher than TTE done 10/15/21 but EF has dropped considerably. The aortic valve is tricuspid. There is severe calcifcation of the aortic valve. There is severe thickening of the aortic valve. Aortic valve regurgitation is mild. Aortic regurgitation PHT measures 198 msec. No aortic stenosis is present. Aortic valve mean gradient measures 24.0 mmHg. Aortic valve peak gradient measures 39.2 mmHg. Aortic valve area, by VTI measures 0.93 cm.  Pulmonic Valve: The pulmonic valve was normal in structure. Pulmonic valve regurgitation is not visualized. No evidence of pulmonic stenosis.  Aorta: Aortic dilatation noted. There is mild dilatation of the aortic root, measuring  40 mm. There is mild dilatation of the ascending aorta, measuring 39 mm.  Venous: The inferior vena cava is normal in size with greater than 50% respiratory variability, suggesting right atrial pressure of 3 mmHg.  IAS/Shunts: No atrial level shunt detected by color flow Doppler.   LEFT VENTRICLE PLAX 2D LVIDd:         4.90 cm   Diastology LVIDs:         4.00 cm   LV e' medial:    5.77 cm/s LV PW:         1.40 cm   LV E/e' medial:  25.3 LV IVS:        1.20 cm   LV e' lateral:   9.57  cm/s LVOT diam:     1.90 cm   LV E/e' lateral: 15.3 LV SV:         55 LV SV Index:   25 LVOT Area:     2.84 cm   RIGHT VENTRICLE RV Basal diam:  2.90 cm RV S prime:     9.57 cm/s TAPSE (M-mode): 1.6 cm  LEFT ATRIUM             Index        RIGHT ATRIUM           Index LA diam:        4.30 cm 1.96 cm/m   RA Area:     12.20 cm LA Vol (A2C):   45.7 ml 20.84 ml/m  RA Volume:   27.70 ml  12.63 ml/m LA Vol (A4C):   60.6 ml 27.64 ml/m LA Biplane Vol: 54.7 ml 24.95 ml/m AORTIC VALVE AV Area (Vmax):    0.86 cm AV Area (Vmean):   0.81 cm AV Area (VTI):     0.93 cm AV Vmax:           313.00 cm/s AV Vmean:          233.000 cm/s AV VTI:            0.589 m AV Peak Grad:      39.2 mmHg AV Mean Grad:      24.0 mmHg LVOT Vmax:         95.30 cm/s LVOT Vmean:        66.333 cm/s LVOT VTI:          0.193 m LVOT/AV VTI ratio: 0.33 AI PHT:            198 msec  AORTA Ao Root diam: 4.00 cm Ao Asc diam:  3.90 cm  MITRAL VALVE MV Area (PHT):              SHUNTS MV Decel Time:              Systemic VTI:  0.19 m MV E velocity: 146.00 cm/s  Systemic Diam: 1.90 cm  Charlton Haws MD Electronically signed by Charlton Haws MD Signature Date/Time: 04/26/2022/10:48:02 AM    Final     CT SCANS  CT CORONARY MORPH W/CTA COR W/SCORE 04/28/2022  Addendum 04/28/2022 11:49 AM ADDENDUM REPORT: 04/28/2022 11:47  EXAM: OVER-READ INTERPRETATION  CT CHEST  The following report is an over-read performed by radiologist Dr. Jacob Moores [facility] on 04/28/2022. This over-read does not include interpretation of cardiac or coronary anatomy or pathology. The cardiac TAVR interpretation by the cardiologist is attached.  COMPARISON:  None.  FINDINGS: Extracardiac findings will be described separately under dictation for contemporaneously obtained CTA chest, abdomen and  pelvis.  IMPRESSION: Please see separate dictation for contemporaneously obtained CTA chest, abdomen and  pelvis dated 04/28/2022 for full description of relevant extracardiac findings.   Electronically Signed By: Yetta Glassman M.D. On: 04/28/2022 11:47  Narrative CLINICAL DATA:  Aortic valve replacement (TAVR), pre-op eval, severe aortic stenosis  EXAM: Cardiac TAVR CT  TECHNIQUE: The patient was scanned on a Siemens Force 106 slice scanner. A 120 kV retrospective scan was triggered in the descending thoracic aorta at 111 HU's. Gantry rotation speed was 270 msecs and collimation was .9 mm. The 3D data set was reconstructed in 5% intervals of the R-R cycle. Systolic and diastolic phases were analyzed on a dedicated work station using MPR, MIP and VRT modes. The patient received 178mL OMNIPAQUE IOHEXOL 350 MG/ML SOLN of contrast.  FINDINGS: Image quality: Good  Aortic Valve:  Tricuspid aortic valve with severely reduced cusp excursion. Severely thickened and severely calcified aortic valve cusps.  AV calcium score: 5671  Virtual Basal Annulus Measurements:  Maximum/Minimum Diameter: 29.1 x 22.5 mm  Perimeter: 79.3 mm  Area:  476 mm2  Trivial LVOT calcifications.  Membranous septal length: 6.4 mm, above virtual annulus  Based on these measurements, the annulus would be suitable for a 26 mm Sapien 3 valve. Alternatively, Heart Team can consider 29 mm Evolut valve. Recommend Heart Team discussion for valve selection.  Sinus of Valsalva Measurements:  Non-coronary:  36 mm  Right - coronary:  36 mm  Left - coronary:  37 mm  Sinus of Valsalva Height:  Left: 27.7 mm  Right: 27.9 mm  Aorta: Conventional 3 vessel branch pattern of aortic arch. No coarctation of the aorta.  Sinotubular Junction:  37 mm  Ascending Thoracic Aorta:  39 mm  Aortic Arch:  28 mm  Descending Thoracic Aorta:  25 mm  Coronary Artery Height above Annulus:  Left main: 21.1 mm  Right coronary: 21 mm  Coronary Arteries: Normal coronary origin. Right dominance. The study was  performed without use of NTG and insufficient for plaque evaluation. Coronary artery calcium seen in 3 vessel distribution. RCA origin at the level of the ST junction.  Optimum Fluoroscopic Angle for Delivery: LAO 0 CAU 0  OTHER:  Atria:  Left atrial appendage: Contrast mixing with incomplete opacification.  Mitral valve: Grossly normal, trivial mitral annular calcifications.  Pulmonary artery: Mild dilation, 34 mm.  Pulmonary veins: Normal anatomy.  IMPRESSION: 1. Tricuspid aortic valve with severely reduced cusp excursion. Severely thickened and severely calcified aortic valve cusps. 2. Aortic valve calcium score: 5671 3. Annulus area: 476 mm2, suitable for 26 mm Sapien 3 valve. Trivial LVOT calcifications. Membranous septal length 6 mm. 4. Sufficient coronary artery heights from annulus. 5. Optimum fluoroscopic angle for delivery: LAO 0 CAU 0  Electronically Signed: By: Cherlynn Kaiser M.D. On: 04/28/2022 11:17           History of Present Illness:   Thomas [name] is a 67 y.o. male with the above problem list.  He presents for follow up after recent hospitalization from 04/26/22-04/28/22. Since discharge, he reports that he has been generally feeling well. He has been able to be active with walking and has not experienced shortness of [name], chest pain, lightheadedness/dizziness. Denies recurrence of palpitations and rapid heart rate. Denies orthopnea, increase in lower extremity edema, weight gain. He has also not experienced any GI side effects of Amiodarone including upset stomach, nausea, vomiting. He reports compliance with all medications prescribed following hospital discharge, has not missed any  doses of Eliquis.      Past Medical History:  Diagnosis Date   Cervical myelopathy ([place])    Diabetes mellitus without complication ([place])    Hypercholesterolemia    Hypertension    Severe aortic stenosis    Stroke ([place]) 1982   pt states he had a mini stroke in 1982    Current Medications: Current Meds  Medication Sig   apixaban (ELIQUIS) 5 MG TABS tablet Take 1 tablet (5 mg total) by mouth 2 (two) times daily.   Apple Cider Vinegar 500 MG TABS Take 500 mg by mouth at bedtime.   diclofenac sodium (VOLTAREN) 1 % GEL Apply 1 Application topically 4 (four) times daily as needed (pain).   FLUZONE HIGH-DOSE QUADRIVALENT 0.7 ML SUSY Inject 0.5 mLs into the muscle once.   gabapentin (NEURONTIN) 400 MG capsule Take 400 mg by mouth 3 (three) times daily.   Ginger, Zingiber officinalis, (GINGER PO) Take 1 tablet by mouth daily.   glimepiride (AMARYL) 4 MG tablet Take 1 tablet (4 mg total) by mouth daily with breakfast.   HYDROmorphone (DILAUDID) 2 MG tablet Take 2 mg by mouth 4 (four) times daily as needed for severe pain.   Lancets (ONETOUCH DELICA PLUS [id number]) MISC CHECK BLOOD SUGAR TWICE DAILY   levothyroxine (SYNTHROID) 150 MCG tablet Take 1 tablet (150 mcg total) by mouth daily before breakfast.   metFORMIN (GLUCOPHAGE) 1000 MG tablet TAKE 1 TABLET (1,000 MG TOTAL) BY MOUTH TWICE A DAY WITH FOOD.  May restart on 05/01/22.   ONETOUCH VERIO test strip daily.   rosuvastatin (CRESTOR) 10 MG tablet Take 1 tablet (10 mg total) by mouth daily.   Turmeric (QC TUMERIC COMPLEX) 500 MG CAPS Take 500 mg by mouth at bedtime.   [DISCONTINUED] amiodarone (PACERONE) 200 MG tablet Take 400 mg (2 tablets) twice daily for 7 days, then take 400 mg (2 tablets) daily for 7 days, then take 200 mg (1 tablet) daily thereafter.   [DISCONTINUED] amiodarone (PACERONE) 200 MG tablet Take 1 tablet by mouth twice a day for 7 days then reduce to 1 tablet by mouth daily    Allergies:   Trulicity [dulaglutide], Januvia [sitagliptin], Other, and Penicillins   Social History   Occupational History   Occupation: Retired from [employer], also drove a dump truck  Tobacco Use   Smoking status: Never   Smokeless tobacco: Never  Scientific laboratory technician Use: Never used  Substance and Sexual  Activity   Alcohol use: Never    Comment: occ beer    Drug use: No   Sexual activity: Not on file    Family Hx: The patient's family history includes Arthritis in his father and mother; Breast cancer in his sister; Diabetes in his mother; Heart attack in his mother; Hypertension in his mother; Skin cancer in his father.  Review of Systems  Constitutional: Negative for decreased appetite and weight gain.  Cardiovascular:  Positive for leg swelling (reports stable edema).  Respiratory:  Negative for cough and shortness of [name].   Gastrointestinal:  Negative for change in bowel habit, bowel incontinence, constipation, diarrhea, nausea and vomiting.  All other systems reviewed and are negative.    EKGs/Labs/Other Test Reviewed:    EKG:  EKG is ordered today.  The ekg ordered today demonstrates atrial flutter with variable AV block, primarily 3:1 conduction.   Recent Labs: 10/06/2021: ALT 24 04/27/2022: TSH 0.361 04/28/2022: BUN 18; Creatinine, Ser 1.57; Hemoglobin 14.8; Magnesium 1.6; Platelets 219; Potassium 4.0; Sodium 135  Recent Lipid Panel Recent Labs    04/27/22 0334  CHOL 91  TRIG 77  HDL 30*  VLDL 15  LDLCALC 46      Risk Assessment/Calculations/Metrics:    CHA2DS2-VASc Score = 6   This indicates a 9.7% annual risk of stroke. The patient's score is based upon: CHF History: 1 HTN History: 1 Diabetes History: 1 Stroke History: 2 Vascular Disease History: 0 Age Score: 1 Gender Score: 0             Physical Exam:    VS:  BP 104/80   Pulse (!) 108   Ht 6' (1.829 m)   Wt 213 lb 3.2 oz (96.7 kg)   SpO2 96%   BMI 28.92 kg/m     Wt Readings from Last 3 Encounters:  05/12/22 213 lb 3.2 oz (96.7 kg)  04/28/22 222 lb (100.7 kg)  04/26/22 222 lb 6.4 oz (100.9 kg)    Vitals reviewed.  Constitutional:      Appearance: Not in distress. Frail.  Eyes:     Conjunctiva/sclera: Conjunctivae normal.     Pupils: Pupils are equal, round, and reactive to light.   Neck:     Vascular: JVD normal.  Pulmonary:     Effort: Pulmonary effort is normal.     [name] sounds: Normal [name] sounds. No wheezing. No rhonchi. No rales.  Chest:     Chest wall: Not tender to palpatation.  Cardiovascular:     PMI at left midclavicular line. Tachycardia present. Irregular rhythm. Normal S1. Normal S2.      Murmurs: There is a harsh midsystolic murmur at the URSB, radiating to the neck.     No gallop.  No click. No rub.  Pulses:    Intact distal pulses.  Edema:    Ankle: bilateral 1+ edema of the ankle. Abdominal:     General: There is no distension.  Musculoskeletal: Normal range of motion. Skin:    General: Skin is warm and dry.  Neurological:     General: No focal deficit present.     Mental Status: Oriented to person, place and time.         ASSESSMENT & PLAN:   Atrial flutter [facility]) Patient first noted to be in atrial flutter during cardiology office visit on 1/29 with 2:1 conduction. He was admitted for rate control and management of HF exacerbation. During that admission, Amiodarone loading was initiated and patient underwent TEE/DCCV on 1/31 with restoration of sinus rhythm. Patient in clinic today for follow-up, noted to be in atrial flutter on ECG today, variable AV block with HR ~108 bpm. Although long-term rhythm control is likely to be challenging, would like to attempt repeat DCCV given plans for TAVR in the near future. Discussed with Dr. Elease Hashimoto today. We will continue Amiodarone 200mg  BID for an additional week, then drop to 200mg  daily. Plan for repeat DCCV on 3/4 (first available slot) with CBC, BMP one week prior. Patient educated on the importance of not missing doses of Eliquis. He was also advised to call the office if he becomes symptomatic with palpitations, rapid HR, chest pain, shortness of [name].   Hyperlipidemia associated with type 2 diabetes mellitus (HCC) Lab Results  Component Value Date   CHOL 91 04/27/2022   HDL 30 (L)  04/27/2022   LDLCALC 46 04/27/2022   LDLDIRECT 76.0 10/09/2020   TRIG 77 04/27/2022   CHOLHDL 3.0 04/27/2022   LDL at goal <70. Continue Crestor 10mg  daily.  Chronic combined systolic  and diastolic heart failure [facility]) Patient recently hospitalized in the setting of dyspnea, lower extremity edema, weight gain. Transthoracic echocardiogram performed in atrial flutter with final read summarizing LVEF 12-45%, grade 2 diastolic dysfunction mild MR, moderate MAC, and severe aortic stenosis. However, EF may be closer to 25-30% on further review. TEE read as 35-40%. Despite recurrence of atrial flutter since discharge, patient has been feeling well, without symptoms of HF such as orthopnea, exertional dyspnea, chest pain. Weight in clinic today is actually down from discharge weight, suspect difference in scale calibration. Patient has been weighing at home and says weight has been stable following discharge. On physical exam, he continues to have 1+ pitting edema in bilateral ankles that reportedly improves with elevation. GDMT continues to be limited by severe aortic stenosis and low BP (104/80 mmHg).   Given severe AS/preload dependence, will not attempt to add beta blocker, ACEi/ARB/ARNI, MRA or SGLT2. If BP increases, will consider SGLT2i.  Continue daily standing weights. Patient instructed to call the office for weight gain >4 lbs in 24 hours.   Aortic stenosis Patient with known AS. Underwent TTE and TEE during recent hospitalization. Transthoracic echocardiogram suggests severe AS with low gradient. Aortic regurgitation PHT measures 198 msec. Aortic valve mean gradient measures 24.0 mmHg. Aortic valve peak gradient measures 39.2 mmHg. Aortic valve area, by VTI measures 0.93 cm. Although patient has relatively few symptoms, severity of valve disease warrants AVR.   Structural heart follow-up on 05/21/22 with Dr. Angelena Form.  CT surgery appointment on 3//7 with Dr. Tenny Craw.  Mild CAD LHC completed  during recent hospitalization. Mild coronary calcification with nonobstructive disease with smooth 10% distal left main stenosis, 20% proximal LAD stenosis, 20% AV groove circumflex stenosis, and 20% RCA stenosis. No chest pain, shortness of [name], or significant exertional limitation. Will defer ASA at this time with Eliquis. Continue Crestor 10mg  daily with LDL goal <70.         Shared Decision Making/Informed Consent The risks (stroke, cardiac arrhythmias rarely resulting in the need for a temporary or permanent pacemaker, skin irritation or burns and complications associated with conscious sedation including aspiration, arrhythmia, respiratory failure and death), benefits (restoration of normal sinus rhythm) and alternatives of a direct current cardioversion were explained in detail to Mr. Baris and he agrees to proceed.     Dispo:  No follow-ups on file.   Medication Adjustments/Labs and Tests Ordered: Current medicines are reviewed at length with the patient today.  Concerns regarding medicines are outlined above.  Tests Ordered: Orders Placed This Encounter  Procedures   Basic metabolic panel   CBC   EKG 12-Lead   Medication Changes: Meds ordered this encounter  Medications   rosuvastatin (CRESTOR) 10 MG tablet    Sig: Take 1 tablet (10 mg total) by mouth daily.    Dispense:  90 tablet    Refill:  3   DISCONTD: amiodarone (PACERONE) 200 MG tablet    Sig: Take 1 tablet by mouth twice a day for 7 days then reduce to 1 tablet by mouth daily    Dispense:  90 tablet    Refill:  3   amiodarone (PACERONE) 200 MG tablet    Sig: Take 1 tablet by mouth twice a day for 7 days then reduce to 1 tablet by mouth daily    Dispense:  102 tablet    Refill:  3   Signed, Lily Kocher, PA-C  05/12/2022 11:44 AM    [facility] [address]  [place], [place]  [address] Phone: [phone]; Fax: [phone]

## 2022-05-12 NOTE — Patient Instructions (Signed)
Medication Instructions:  Your physician has recommended you make the following change in your medication:   START Crestor 10 mg taking 1 daily  INCREASE the Amiodarone to 1 tablet by mouth twice a day for 7 days then reduce to 1 tablet daily   *If you need a refill on your cardiac medications before your next appointment, please call your pharmacy*   Lab Work: YOU WILL NEED TO [place] 05/26/22 FOR:  BMET & CBC.Marland Kitchen CALL TO SEE IF YOU NEED TO SCHEDULE AN APPOINTMENT.  If you have labs (blood work) drawn today and your tests are completely normal, you will receive your results only by: [place] (if you have MyChart) OR A paper copy in the mail If you have any lab test that is abnormal or we need to change your treatment, we will call you to review the results.   Testing/Procedures: Your physician has recommended that you have a Cardioversion (DCCV). Electrical Cardioversion uses a jolt of electricity to your heart either through paddles or wired patches attached to your chest. This is a controlled, usually prescheduled, procedure. Defibrillation is done under light anesthesia in the hospital, and you usually go home the day of the procedure. This is done to get your heart back into a normal rhythm. You are not awake for the procedure. Please see the instruction sheet given to you BELOW:      Dear [name]  You are scheduled for a Cardioversion on Monday, March 4 with Dr. Radford Pax.  Please arrive at the [facility] (Main Entrance A) at [facility]: [place], [place] [address] at 8:30 AM.   DIET:  Nothing to eat or drink after midnight except a sip of water with medications (see medication instructions below)  MEDICATION INSTRUCTIONS: HOLD METFORMIN AND GLIMEPIRIDE UNTIL AFTER THE PROCEDURE Continue taking your anticoagulant (blood thinner): Apixaban (Eliquis).  You will need to continue this after your procedure until you are  told by your provider that it is safe to stop.    LABS:   Come to the lab at [facility] at [address] between the hours of 8:00 am and 4:30 pm. You do NOT have to be fasting.  FYI:  For your safety, and to allow [place] to monitor your vital signs accurately during the surgery/procedure we request: If you have artificial nails, gel coating, SNS etc, please have those removed prior to your surgery/procedure. Not having the nail coverings /polish removed may result in cancellation or delay of your surgery/procedure.  You must have a responsible person to drive you home and stay in the waiting area during your procedure. Failure to do so could result in cancellation.  Bring your insurance cards.  *Special Note: Every effort is made to have your procedure done on time. Occasionally there are emergencies that occur at the hospital that may cause delays. Please be patient if a delay does occur.        Follow-Up: At [facility], you and your health needs are our priority.  As part of our continuing mission to provide you with exceptional heart care, we have created designated Provider Care Teams.  These Care Teams include your primary Cardiologist (physician) and Advanced Practice Providers (APPs -  Physician Assistants and Nurse Practitioners) who all work together to provide you with the care you need, when you need it.  We recommend signing up for the patient portal called "MyChart".  Sign  up information is provided on this After Visit Summary.  MyChart is used to connect with patients for Virtual Visits (Telemedicine).  Patients are able to view lab/test results, encounter notes, upcoming appointments, etc.  Non-urgent messages can be sent to your provider as well.   To learn more about what you can do with MyChart, go to [email or web address].    Your next appointment:   2 week(s) after 05/31/22  Provider:   Mertie Moores, MD  or Richardson Dopp, PA-C          Other Instructions

## 2022-05-12 NOTE — Assessment & Plan Note (Signed)
Patient with known AS. Underwent TTE and TEE during recent hospitalization. Transthoracic echocardiogram suggests severe AS with low gradient. Aortic regurgitation PHT measures 198 msec. Aortic valve mean gradient measures 24.0 mmHg. Aortic valve peak gradient measures 39.2 mmHg. Aortic valve area, by VTI measures 0.93 cm. Although patient has relatively few symptoms, severity of valve disease warrants AVR.   Structural heart follow-up on 05/21/22 with Dr. Angelena Form.  CT surgery appointment on 3//7 with Dr. Tenny Craw.

## 2022-05-12 NOTE — Assessment & Plan Note (Addendum)
Patient recently hospitalized in the setting of dyspnea, lower extremity edema, weight gain. Transthoracic echocardiogram performed in atrial flutter with final read summarizing LVEF [id number], grade 2 diastolic dysfunction mild MR, moderate MAC, and severe aortic stenosis. However, EF may be closer to 25-30% on further review. TEE read as 35-40%. Despite recurrence of atrial flutter since discharge, patient has been feeling well, without symptoms of HF such as orthopnea, exertional dyspnea, chest pain. Weight in clinic today is actually down from discharge weight, suspect difference in scale calibration. Patient has been weighing at home and says weight has been stable following discharge. On physical exam, he continues to have 1+ pitting edema in bilateral ankles that reportedly improves with elevation. GDMT continues to be limited by severe aortic stenosis and low BP (104/80 mmHg).   Given severe AS/preload dependence, will not attempt to add beta blocker, ACEi/ARB/ARNI, MRA or SGLT2. If BP increases, will consider SGLT2i.  Continue daily standing weights. Patient instructed to call the office for weight gain >4 lbs in 24 hours.

## 2022-05-12 NOTE — Assessment & Plan Note (Addendum)
Lab Results  Component Value Date   CHOL 91 04/27/2022   HDL 30 (L) 04/27/2022   LDLCALC 46 04/27/2022   LDLDIRECT 76.0 10/09/2020   TRIG 77 04/27/2022   CHOLHDL 3.0 04/27/2022   LDL near goal <70. Continue Crestor 33m daily.

## 2022-05-12 NOTE — Assessment & Plan Note (Signed)
LHC completed during recent hospitalization. Mild coronary calcification with nonobstructive disease with smooth 10% distal left main stenosis, 20% proximal LAD stenosis, 20% AV groove circumflex stenosis, and 20% RCA stenosis. No chest pain, shortness of breath, or significant exertional limitation. Will defer ASA at this time with Eliquis. Continue Crestor 58m daily with LDL goal <70.

## 2022-05-12 NOTE — Assessment & Plan Note (Addendum)
Patient first noted to be in atrial flutter during cardiology office visit on 1/29 with 2:1 conduction. He was admitted for rate control and management of HF exacerbation. During that admission, Amiodarone loading was initiated and patient underwent TEE/DCCV on 1/31 with restoration of sinus rhythm. Patient in clinic today for follow-up, noted to be in atrial flutter on ECG today, variable AV block with HR ~108 bpm. Although long-term rhythm control is likely to be challenging, would like to attempt repeat DCCV given plans for TAVR in the near future. Discussed with Dr. Acie Fredrickson today. We will continue Amiodarone 233m BID for an additional week, then drop to 2076mdaily. Plan for repeat DCCV on 3/4 (first available slot) with CBC, BMP one week prior. Patient educated on the importance of not missing doses of Eliquis. He was also advised to call the office if he becomes symptomatic with palpitations, rapid HR, chest pain, shortness of breath. Follow up with Dr. NaAcie Fredricksoncheduled 06/15/22.

## 2022-05-13 ENCOUNTER — Other Ambulatory Visit: Payer: Self-pay | Admitting: Family Medicine

## 2022-05-13 ENCOUNTER — Other Ambulatory Visit (HOSPITAL_COMMUNITY): Payer: Self-pay

## 2022-05-21 ENCOUNTER — Ambulatory Visit: Payer: Medicare Other | Attending: Cardiovascular Disease | Admitting: Cardiovascular Disease

## 2022-05-21 ENCOUNTER — Encounter: Payer: Self-pay | Admitting: Cardiovascular Disease

## 2022-05-21 VITALS — BP 138/96 | HR 111 | Ht 72.0 in | Wt 218.6 lb

## 2022-05-21 DIAGNOSIS — I5042 Chronic combined systolic (congestive) and diastolic (congestive) heart failure: Secondary | ICD-10-CM

## 2022-05-21 DIAGNOSIS — I350 Nonrheumatic aortic (valve) stenosis: Secondary | ICD-10-CM | POA: Diagnosis not present

## 2022-05-21 LAB — BASIC METABOLIC PANEL
BUN/Creatinine Ratio: 19 (ref 10–24)
BUN: 34 mg/dL — ABNORMAL HIGH (ref 8–27)
CO2: 23 mmol/L (ref 20–29)
Calcium: 9.1 mg/dL (ref 8.6–10.2)
Chloride: 102 mmol/L (ref 96–106)
Creatinine, Ser: 1.81 mg/dL — ABNORMAL HIGH (ref 0.76–1.27)
Glucose: 185 mg/dL — ABNORMAL HIGH (ref 70–99)
Potassium: 4.5 mmol/L (ref 3.5–5.2)
Sodium: 139 mmol/L (ref 134–144)
eGFR: 40 mL/min/{1.73_m2} — ABNORMAL LOW (ref >59)

## 2022-05-21 MED ORDER — FUROSEMIDE 40 MG PO TABS: 40.0000 mg | ORAL_TABLET | Freq: Every day | ORAL | 3 refills | Status: DC | PRN

## 2022-05-21 NOTE — H&P (View-Only) (Signed)
Structural Heart Clinic Note  Chief Complaint  Patient presents with   Follow-up    Aortic stenosis   History of Present Illness: 67 yo male with history of diabetes, HTN, hyperlipidemia, prior CVA, cervical myelopathy with cord compression and moderate to severe aortic stenosis who is here today for cardiac follow up. I saw him as a new consult in November 2023 to discuss his aortic stenosis. He was planning to have cervical spine surgery in July 2023 and a murmur was noted on exam. Echo with LVEF=60-65%. Mild LVH. Trivial MR. Moderately severe to severe aortic valve stenosis with mean gradient of 39 mmHg, peak gradient 68.6 mmHg, AVA 0.82 cm2, DI 0.29, SVI 32. He had cord compression and his cervical surgery was felt to be urgent. He underwent cervical spine surgery on 10/28/21 with anterior cervical discectomies and did well with this. He was seen by Dr. Acie Fredrickson on 04/17/22 with LE edema, chest pain and dyspnea. EKG with atrial flutter with RVR. He was admitted to [facility], loaded with amiodarone, started on Eliquis. He underwent a TEE guided cardioversion. Echo 04/26/22 with LVEF around 35% with global hypokinesis. Mild MR. Severe low flow/low gradient AS with mean gradient 24 mmHg, peak gradient 39 mmHg, AVA 0.81 cm2, SVI 25, DI 0.33. Cardiac cath 04/27/22 with minimal non-obstructive CAD. Cardiac CTA 04/28/22 with aortic valve calcium score of 5671. Valve area 476 mm2 suitable for a 26 mm Edwards Sapien 3 Ultra valve or 29 mm Medtrontic Evolut valve. He was seen in our office 05/12/22 by Lily Kocher, PA-C and was in rated controlled atrial flutter. Cardioversion planned for 05/31/22.   He is here today for follow up. The patient denies any chest pain, palpitations, lower extremity edema, orthopnea, PND, dizziness, near syncope or syncope. He is feeling well overall but having some dyspnea with exertion.   He lives in [place], [place] with his wife. His daughter works in our office as a Technical brewer. He is retired from  [employer]. He has full dentures.   Primary Care Physician: Vivi Barrack, MD Primary Cardiologist: Nahser Referring Cardiologist: Nahser  Past Medical History:  Diagnosis Date   Cervical myelopathy [facility])    Diabetes mellitus without complication ([place])    Hypercholesterolemia    Hypertension    Severe aortic stenosis    Stroke [facility]) 1982   pt states he had a mini stroke in 1982    Past Surgical History:  Procedure Laterality Date   ANTERIOR CERVICAL DECOMP/DISCECTOMY FUSION N/A 10/28/2021   Procedure: ACDF - C3-C4 - C4-C5 - C5-C6;  Surgeon: Kary Kos, MD;  Location: [place];  Service: Neurosurgery;  Laterality: N/A;   BACK SURGERY     CARDIOVERSION N/A 04/28/2022   Procedure: CARDIOVERSION;  Surgeon: Buford Dresser, MD;  Location: [place];  Service: Cardiovascular;  Laterality: N/A;   HERNIA REPAIR     RIGHT/LEFT HEART CATH AND CORONARY ANGIOGRAPHY N/A 04/27/2022   Procedure: RIGHT/LEFT HEART CATH AND CORONARY ANGIOGRAPHY;  Surgeon: Troy Sine, MD;  Location: [place] CV LAB;  Service: Cardiovascular;  Laterality: N/A;   TEE WITHOUT CARDIOVERSION N/A 04/28/2022   Procedure: TRANSESOPHAGEAL ECHOCARDIOGRAM (TEE);  Surgeon: Buford Dresser, MD;  Location: [facility] ENDOSCOPY;  Service: Cardiovascular;  Laterality: N/A;    Current Outpatient Medications  Medication Sig Dispense Refill   amiodarone (PACERONE) 200 MG tablet Take 1 tablet by mouth twice a day for 7 days then reduce to 1 tablet by mouth daily 102 tablet 3   apixaban (ELIQUIS) 5 MG  TABS tablet Take 1 tablet (5 mg total) by mouth 2 (two) times daily. 180 tablet 3   Apple Cider Vinegar 500 MG TABS Take 500 mg by mouth at bedtime.     diclofenac sodium (VOLTAREN) 1 % GEL Apply 1 Application topically 4 (four) times daily as needed (pain).  4   FLUZONE HIGH-DOSE QUADRIVALENT 0.7 ML SUSY Inject 0.5 mLs into the muscle once.     furosemide (LASIX) 40 MG tablet Take 1 tablet (40 mg total) by mouth daily as  needed. 30 tablet 3   gabapentin (NEURONTIN) 400 MG capsule Take 400 mg by mouth 3 (three) times daily.     Ginger, Zingiber officinalis, (GINGER PO) Take 1 tablet by mouth daily.     glimepiride (AMARYL) 4 MG tablet Take 1 tablet (4 mg total) by mouth daily with breakfast. 90 tablet 3   Lancets (ONETOUCH DELICA PLUS 123XX123) MISC CHECK BLOOD SUGAR TWICE DAILY 200 each 1   levothyroxine (SYNTHROID) 150 MCG tablet TAKE ONE TABLET ONCE DAILY BEFORE BREAKFAST 90 tablet 0   metFORMIN (GLUCOPHAGE) 1000 MG tablet TAKE 1 TABLET (1,000 MG TOTAL) BY MOUTH TWICE A DAY WITH FOOD.  May restart on 05/01/22. 180 tablet 0   morphine (MSIR) 15 MG tablet Take 15 mg by mouth every 8 (eight) hours as needed for moderate pain.     ONETOUCH VERIO test strip CHECK BLOOD SUGAR UP TO THREE TIMES DAILY AS DIRECTED 100 strip 12   rosuvastatin (CRESTOR) 10 MG tablet Take 1 tablet (10 mg total) by mouth daily. 90 tablet 3   Turmeric (QC TUMERIC COMPLEX) 500 MG CAPS Take 500 mg by mouth at bedtime.     No current facility-administered medications for this visit.    Allergies  Allergen Reactions   Trulicity [Dulaglutide] Hives   Januvia [Sitagliptin] Other (See Comments)    Red eyes   Other     Stomach medicine, not sure of name. Had a mini stroke   Penicillins Swelling    Arm swelling Has patient had a PCN reaction causing immediate rash, facial/tongue/throat swelling, SOB or lightheadedness with hypotension: No Has patient had a PCN reaction causing severe rash involving mucus membranes or skin necrosis: No Has patient had a PCN reaction that required hospitalization No Has patient had a PCN reaction occurring within the last 10 years: No If all of the above answers are "NO", then may proceed with Cephalosporin use.    Social History   Socioeconomic History   Marital status: Married    Spouse name: Not on file   Number of children: 3   Years of education: Not on file   Highest education level: Not on file   Occupational History   Occupation: Retired from [employer], also drove a dump truck  Tobacco Use   Smoking status: Never   Smokeless tobacco: Never  Scientific laboratory technician Use: Never used  Substance and Sexual Activity   Alcohol use: Never    Comment: occ beer    Drug use: No   Sexual activity: Not on file  Other Topics Concern   Not on file  Social History Narrative   Not on file   Social Determinants of Health   Financial Resource Strain: [place]  (03/04/2022)   Overall Financial Resource Strain (CARDIA)    Difficulty of Paying Living Expenses: Not hard at all  Food Insecurity: No Food Insecurity (04/30/2022)   Hunger Vital Sign    Worried About Running Out of Food  in the Last Year: Never true    [place] in the Last Year: Never true  Transportation Needs: No Transportation Needs (04/30/2022)   PRAPARE - Hydrologist (Medical): No    Lack of Transportation (Non-Medical): No  Physical Activity: Insufficiently Active (03/04/2022)   Exercise Vital Sign    Days of Exercise per Week: 7 days    Minutes of Exercise per Session: 20 min  Stress: No Stress Concern Present (03/04/2022)   [place]    Feeling of Stress : Not at all  Social Connections: Moderately Isolated (03/04/2022)   Social Connection and Isolation Panel [NHANES]    Frequency of Communication with Friends and Family: Once a week    Frequency of Social Gatherings with Friends and Family: More than three times a week    Attends Religious Services: Never    Marine scientist or Organizations: No    Attends Archivist Meetings: Never    Marital Status: Married  Human resources officer Violence: Not At Risk (04/27/2022)   Humiliation, Afraid, Rape, and Kick questionnaire    Fear of Current or Ex-Partner: No    Emotionally Abused: No    Physically Abused: No    Sexually Abused: No    Family History  Problem  Relation Age of Onset   Diabetes Mother    Heart attack Mother    Hypertension Mother    Arthritis Mother    Skin cancer Father    Arthritis Father    Breast cancer Sister     Review of Systems:  As stated in the HPI and otherwise negative.   BP (!) 138/96   Pulse (!) 111   Ht 6' (1.829 m)   Wt 99.2 kg   SpO2 98%   BMI 29.65 kg/m   Physical Examination: General: Well developed, well nourished, NAD  HEENT: OP clear, mucus membranes moist  SKIN: warm, dry. No rashes. Neuro: No focal deficits  Musculoskeletal: Muscle strength 5/5 all ext  Psychiatric: Mood and affect normal  Neck: No JVD, no carotid bruits, no thyromegaly, no lymphadenopathy.  Lungs:Clear bilaterally, no wheezes, rhonci, crackles Cardiovascular: Irreg irreg. Harsh systolic murmur.  Abdomen:Soft. Bowel sounds present. Non-tender.  Extremities: Trace to 1+ bilateral lower extremity edema.   EKG:  EKG is not ordered today. The ekg ordered today demonstrates  Echo 04/26/22: 1. Global hypokinesis worse in inferior wall . Left ventricular ejection  fraction, by estimation, is 40 to 45%. The left ventricle has mildly  decreased function. The left ventricle demonstrates global hypokinesis.  The left ventricular internal cavity  size was moderately dilated. There is mild left ventricular hypertrophy.  Left ventricular diastolic parameters are consistent with Grade II  diastolic dysfunction (pseudonormalization). Elevated left ventricular  end-diastolic pressure.   2. Right ventricular systolic function is normal. The right ventricular  size is normal.   3. Left atrial size was moderately dilated.   4. The mitral valve is abnormal. Mild mitral valve regurgitation. No  evidence of mitral stenosis. Moderate mitral annular calcification.   5. AS remains severe Gradients low and DVI higher than TTE done 10/15/21  but EF has dropped considerably . The aortic valve is tricuspid. There is  severe calcifcation of the  aortic valve. There is severe thickening of the  aortic valve. Aortic valve  regurgitation is mild. No aortic stenosis is present.   6. Aortic dilatation noted. There is mild dilatation  of the aortic root,  measuring 40 mm. There is mild dilatation of the ascending aorta,  measuring 39 mm.   7. The inferior vena cava is normal in size with greater than 50%  respiratory variability, suggesting right atrial pressure of 3 mmHg.   FINDINGS   Left Ventricle: Global hypokinesis worse in inferior wall. Left  ventricular ejection fraction, by estimation, is 40 to 45%. The left  ventricle has mildly decreased function. The left ventricle demonstrates  global hypokinesis. The left ventricular  internal cavity size was moderately dilated. There is mild left  ventricular hypertrophy. Left ventricular diastolic parameters are  consistent with Grade II diastolic dysfunction (pseudonormalization).  Elevated left ventricular end-diastolic pressure.   Right Ventricle: The right ventricular size is normal. No increase in  right ventricular wall thickness. Right ventricular systolic function is  normal.   Left Atrium: Left atrial size was moderately dilated.   Right Atrium: Right atrial size was normal in size.   Pericardium: There is no evidence of pericardial effusion.   Mitral Valve: The mitral valve is abnormal. There is moderate thickening  of the mitral valve leaflet(s). There is moderate calcification of the  mitral valve leaflet(s). Moderate mitral annular calcification. Mild  mitral valve regurgitation. No evidence  of mitral valve stenosis.   Tricuspid Valve: The tricuspid valve is normal in structure. Tricuspid  valve regurgitation is not demonstrated. No evidence of tricuspid  stenosis.   Aortic Valve: AS remains severe Gradients low and DVI higher than TTE done  10/15/21 but EF has dropped considerably. The aortic valve is tricuspid.  There is severe calcifcation of the aortic  valve. There is severe  thickening of the aortic valve. Aortic  valve regurgitation is mild. Aortic regurgitation PHT measures 198 msec.  No aortic stenosis is present. Aortic valve mean gradient measures 24.0  mmHg. Aortic valve peak gradient measures 39.2 mmHg. Aortic valve area, by  VTI measures 0.93 cm.   Pulmonic Valve: The pulmonic valve was normal in structure. Pulmonic valve  regurgitation is not visualized. No evidence of pulmonic stenosis.   Aorta: Aortic dilatation noted. There is mild dilatation of the aortic  root, measuring 40 mm. There is mild dilatation of the ascending aorta,  measuring 39 mm.   Venous: The inferior vena cava is normal in size with greater than 50%  respiratory variability, suggesting right atrial pressure of 3 mmHg.   IAS/Shunts: No atrial level shunt detected by color flow Doppler.     LEFT VENTRICLE  PLAX 2D  LVIDd:         4.90 cm   Diastology  LVIDs:         4.00 cm   LV e' medial:    5.77 cm/s  LV PW:         1.40 cm   LV E/e' medial:  25.3  LV IVS:        1.20 cm   LV e' lateral:   9.57 cm/s  LVOT diam:     1.90 cm   LV E/e' lateral: 15.3  LV SV:         55  LV SV Index:   25  LVOT Area:     2.84 cm     RIGHT VENTRICLE  RV Basal diam:  2.90 cm  RV S prime:     9.57 cm/s  TAPSE (M-mode): 1.6 cm   LEFT ATRIUM             Index  RIGHT ATRIUM           Index  LA diam:        4.30 cm 1.96 cm/m   RA Area:     12.20 cm  LA Vol (A2C):   45.7 ml 20.84 ml/m  RA Volume:   27.70 ml  12.63 ml/m  LA Vol (A4C):   60.6 ml 27.64 ml/m  LA Biplane Vol: 54.7 ml 24.95 ml/m   AORTIC VALVE  AV Area (Vmax):    0.86 cm  AV Area (Vmean):   0.81 cm  AV Area (VTI):     0.93 cm  AV Vmax:           313.00 cm/s  AV Vmean:          233.000 cm/s  AV VTI:            0.589 m  AV Peak Grad:      39.2 mmHg  AV Mean Grad:      24.0 mmHg  LVOT Vmax:         95.30 cm/s  LVOT Vmean:        66.333 cm/s  LVOT VTI:          0.193 m  LVOT/AV VTI  ratio: 0.33  AI PHT:            198 msec    AORTA  Ao Root diam: 4.00 cm  Ao Asc diam:  3.90 cm   MITRAL VALVE  MV Area (PHT):              SHUNTS  MV Decel Time:              Systemic VTI:  0.19 m  MV E velocity: 146.00 cm/s  Systemic Diam: 1.90 cm    Cardiac cath 04/27/22: Mid RCA lesion is 20% stenosed.   Mid LM lesion is 10% stenosed.   Prox LAD lesion is 20% stenosed.   Mid Cx lesion is 20% stenosed.   There is severe aortic valve stenosis.   Mild coronary calcification with nonobstructive disease with smooth 10% distal left main stenosis, 20% proximal LAD stenosis, 20% AV groove circumflex stenosis, and 20% RCA stenosis.   Moderate right heart pressure elevation with moderate pulmonary hypertension with mean PA pressure 35 mmHg.   Severe aortic stenosis with a mean gradient 28.2, peak to peak gradient 33 mmHg, and calculated AVA 0.9 cm.   RECOMMENDATION: Patient will be treated with anticoagulation with his atrial flutter/fibrillation with probable attempted cardioversion. Ultimately will need structural heart team evaluation for aortic valve replacement.   Recommendations  Antiplatelet/Anticoag Heparin will be reinitiated in light of the patient's atrial fibrillation/atrial flutter.   Surgeon Notes    04/28/2022  8:37 AM CV Procedure signed by Buford Dresser, MD   Clinical Presentation  CHF/Shock Congestive heart failure not present. No shock present.   Procedural Details  Technical Details Mr. Aijalon Wingett is a 67 year old male who in July 2023 was found to have severe AS on echo with a mean gradient 39 peak gradient 68.6 mmHg and AVA 0.8 cm2.  EF was 60 to 65%.  He was seen yesterday by Dr. Acie Fredrickson and was in atrial flutter at heart rate of 130.  He was advised that he be hospitalized due to potential decompensation risk.  He was started on IV amiodarone and drip and heparinized.  A follow-up echo yesterday April 26, 2022 showed reduction in LV function  with EF at 40 to 45%  with peak gradient 39 mean gradient 24 mmHg and AVA 0.93.  He has continued to be in A-fib/flutter.  He is now referred for right and left heart cardiac catheterization.  Patient arrived to the Cath Lab in the fasting state.  He was premedicated with Versed 2 mg and fentanyl 25 mcg.  An IV was present in his right brachial vein.  This was exchanged for a 5 6 Glidesheath slender sheath.  A 5 French right heart catheter was then inserted and advanced to the RA, RV, PA, pulmonary capillary wedge positions.  Oxygen saturation was obtained x 2 in the pulmonary artery.  Guidance was used for right radial artery access.  The right radial artery was punctured via the Seldinger technique, and a 6 [place] Glidesheath Slender was inserted without difficulty.  A radial cocktail consisting of Verapamil 3 mg was administered. The patient received weight adjusted heparin. A safety J wire was advanced into the ascending aorta.  Initially, a 5 [place] JR4 catheter was inserted but this could not selectively adequately engage the RCA.  A 3 DRC catheter was then attempted unsuccessfully.  Ultimately a JL 4 catheter was needed for selective angiography in the left coronary system.  An AR-1 catheter was then used for angiography to the RCA.  The aortic valve was crossed with an AL-1 catheter and straight wire for left ventricular pressure recording.  All catheters were removed from the patient.  A TR radial band was applied for hemostasis.  The right heart cath was removed.  Hemostasis was obtained by manual pressure.  The patient left the catheterization laboratory in stable condition.     Estimated blood loss <50 mL.   During this procedure medications were administered to achieve and maintain moderate conscious sedation while the patient's heart rate, blood pressure, and oxygen saturation were continuously monitored and I was present face-to-face 100% of this time.   Medications (Filter: Administrations  occurring from 1533 to 1711 on 04/27/22)  important  Continuous medications are totaled by the amount administered until 04/27/22 1711.   Heparin (Porcine) in NaCl 1000-0.9 UT/500ML-% SOLN (mL)  Total volume: 1,000 mL Date/Time Rate/Dose/Volume Action   04/27/22 1544 500 mL Given   1544 500 mL Given   fentaNYL (SUBLIMAZE) injection (mcg)  Total dose: 25 mcg Date/Time Rate/Dose/Volume Action   04/27/22 1603 25 mcg Given   midazolam (VERSED) injection (mg)  Total dose: 2 mg Date/Time Rate/Dose/Volume Action   04/27/22 1603 2 mg Given   lidocaine (PF) (XYLOCAINE) 1 % injection (mL)  Total volume: 3 mL Date/Time Rate/Dose/Volume Action   04/27/22 1608 1 mL Given   1616 2 mL Given   Radial Cocktail/Verapamil only (mL)  Total volume: 10 mL Date/Time Rate/Dose/Volume Action   04/27/22 1620 10 mL Given   heparin sodium (porcine) injection (Units)  Total dose: 5,000 Units Date/Time Rate/Dose/Volume Action   04/27/22 1627 5,000 Units Given   iohexol (OMNIPAQUE) 350 MG/ML injection (mL)  Total volume: 110 mL Date/Time Rate/Dose/Volume Action   04/27/22 1704 110 mL Given   acetaminophen (TYLENOL) tablet 650 mg (mg)  Total dose: Cannot be calculated* Dosing weight: 100.9 *Administration dose not documented Date/Time Rate/Dose/Volume Action   04/27/22 1533 *Not included in total MAR Hold   amiodarone (PACERONE) tablet 400 mg (mg)  Total dose: Cannot be calculated* Dosing weight: 100.9 *Administration dose not documented Date/Time Rate/Dose/Volume Action   04/27/22 1533 *Not included in total MAR Hold   gabapentin (NEURONTIN) capsule 400 mg (mg)  Total dose: Cannot  be calculated* *Administration dose not documented Date/Time Rate/Dose/Volume Action   04/27/22 1533 *Not included in total MAR Hold   1600 *Not included in total Automatically Held   insulin aspart (novoLOG) injection 0-15 Units (Units)  Total dose: Cannot be calculated* Dosing weight: 100.9 *Administration dose not  documented Date/Time Rate/Dose/Volume Action   04/27/22 1533 *Not included in total MAR Hold   1700 *Not included in total Automatically Held   levothyroxine (SYNTHROID) tablet 150 mcg (mcg)  Total dose: Cannot be calculated* *Administration dose not documented Date/Time Rate/Dose/Volume Action   04/27/22 1533 *Not included in total MAR Hold   ondansetron (ZOFRAN) injection 4 mg (mg)  Total dose: Cannot be calculated* Dosing weight: 100.9 *Administration dose not documented Date/Time Rate/Dose/Volume Action   04/27/22 1533 *Not included in total MAR Hold   simvastatin (ZOCOR) tablet 40 mg (mg)  Total dose: Cannot be calculated* *Administration dose not documented Date/Time Rate/Dose/Volume Action   04/27/22 1533 *Not included in total MAR Hold    Sedation Time  Sedation Time Physician-1: 55 minutes 29 seconds Contrast  Medication Name Total Dose  iohexol (OMNIPAQUE) 350 MG/ML injection 110 mL   Radiation/Fluoro  Fluoro time: 19.6 (min) DAP: 37881 (mGycm2) Cumulative Air Kerma: 123456 (mGy) Complications  Complications documented before study signed (04/27/2022  [id number] PM)   No complications were associated with this study.  Documented by Gilford Rile, RT - 04/27/2022  4:59 PM     Coronary Findings  Diagnostic Dominance: Right Left Main  Mid LM lesion is 10% stenosed.    Left Anterior Descending  Prox LAD lesion is 20% stenosed.    Left Circumflex  Mid Cx lesion is 20% stenosed.    Right Coronary Artery  Mid RCA lesion is 20% stenosed.    Intervention   No interventions have been documented.   Right Heart  Right Atrium RA: A-wave 15; V wave 12; mean 10 RV: 61/18 PA 53/20; mean 35 PW A-wave 32, V wave 27; mean 26 Initial AO 99/68 Initial LV 136/19  LV pullback: LV: 138/13 AO: 101/63  Oxygen saturation in the PA 62% and in the central aorta 89%.  By the Fick method, cardiac output 5.8 L/min with cardiac index 2.6 L/min/m.  PVR: 1.6  WU  Arctic valve: Mean gradient 28.2, peak to peak gradient 33.0, aortic valve area 0.88 cm. LV:138/13   Left Heart  Aortic Valve There is severe aortic valve stenosis. The aortic valve is calcified. There is restricted aortic valve motion.   Coronary Diagrams  Diagnostic Dominance: Right  Intervention   Implants   No implant documentation for this case.   Syngo Images   Show images for CARDIAC CATHETERIZATION Images on Long Term Storage   Show images for [place], [place] to Procedure Log  Procedure Log    Hemo Data  Flowsheet Row Most Recent Value  Fick Cardiac Output 5.77 L/min  Fick Cardiac Output Index 2.59 (L/min)/BSA  Aortic Mean Gradient 28.21 mmHg  Aortic Peak Gradient 33 mmHg  Aortic Valve Area 0.88  Aortic Value Area Index 0.39 cm2/BSA  RA A Wave 15 mmHg  RA V Wave 12 mmHg  RA Mean 10 mmHg  RV Systolic Pressure 61 mmHg  RV Diastolic Pressure 5 mmHg  RV EDP 18 mmHg  PA Systolic Pressure 53 mmHg  PA Diastolic Pressure 20 mmHg  PA Mean 35 mmHg  PW A Wave 32 mmHg  PW V Wave 27 mmHg  PW Mean 26 mmHg  AO Systolic Pressure [id number]  mmHg  AO Diastolic Pressure 63 mmHg  AO Mean 80 mmHg  LV Systolic Pressure [id number] mmHg  LV Diastolic Pressure 8 mmHg  LV EDP 19 mmHg  AOp Systolic Pressure [id number] mmHg  AOp Diastolic Pressure 65 mmHg  AOp Mean Pressure 82 mmHg  LVp Systolic Pressure 0000000 mmHg  LVp Diastolic Pressure 8 mmHg  LVp EDP Pressure 13 mmHg  QP/QS 1  TPVR Index 13.53 HRUI  TSVR Index 30.15 HRUI  PVR SVR Ratio 0.13  TPVR/TSVR Ratio 0.45   Cardiac CT 04/28/22: FINDINGS: Image quality: Good   Aortic Valve:   Tricuspid aortic valve with severely reduced cusp excursion. Severely thickened and severely calcified aortic valve cusps.   AV calcium score: 5671   Virtual Basal Annulus Measurements:   Maximum/Minimum Diameter: 29.1 x 22.5 mm   Perimeter: 79.3 mm   Area:  476 mm2   Trivial LVOT calcifications.   Membranous septal length: 6.4  mm, above virtual annulus   Based on these measurements, the annulus would be suitable for a 26 mm Sapien 3 valve. Alternatively, Heart Team can consider 29 mm Evolut valve. Recommend Heart Team discussion for valve selection.   Sinus of Valsalva Measurements:   Non-coronary:  36 mm   Right - coronary:  36 mm   Left - coronary:  37 mm   Sinus of Valsalva Height:   Left: 27.7 mm   Right: 27.9 mm   Aorta: Conventional 3 vessel branch pattern of aortic arch. No coarctation of the aorta.   Sinotubular Junction:  37 mm   Ascending Thoracic Aorta:  39 mm   Aortic Arch:  28 mm   Descending Thoracic Aorta:  25 mm   Coronary Artery Height above Annulus:   Left main: 21.1 mm   Right coronary: 21 mm   Coronary Arteries: Normal coronary origin. Right dominance. The study was performed without use of NTG and insufficient for plaque evaluation. Coronary artery calcium seen in 3 vessel distribution. RCA origin at the level of the ST junction.   Optimum Fluoroscopic Angle for Delivery: LAO 0 CAU 0   OTHER:   Atria:   Left atrial appendage: Contrast mixing with incomplete opacification.   Mitral valve: Grossly normal, trivial mitral annular calcifications.   Pulmonary artery: Mild dilation, 34 mm.   Pulmonary veins: Normal anatomy.   IMPRESSION: 1. Tricuspid aortic valve with severely reduced cusp excursion. Severely thickened and severely calcified aortic valve cusps. 2. Aortic valve calcium score: 5671 3. Annulus area: 476 mm2, suitable for 26 mm Sapien 3 valve. Trivial LVOT calcifications. Membranous septal length 6 mm. 4. Sufficient coronary artery heights from annulus. 5. Optimum fluoroscopic angle for delivery: LAO 0 CAU 0  FINDINGS: CTA CHEST FINDINGS   Cardiovascular: Normal heart size. No pericardial effusion. Normal caliber thoracic aorta with mild atherosclerotic disease. Standard three-vessel aortic arch with no significant stenosis. Aortic  valve thickening and calcifications. Left main and 3 vessel coronary artery disease.   Mediastinum/Nodes: Mildly patulous esophagus. Thyroid is unremarkable. Mildly enlarged mediastinal and right hilar lymph nodes, likely reactive. Reference subcarinal lymph node measuring 1.2 cm in short axis on series 4, image 97. Reference right perihilar lymph node measuring 1.0 cm in short axis on series 4, image 94   Lungs/Pleura: Central airways are patent. Mild smooth septal thickening and diffuse ground-glass opacities. Right basilar atelectasis. No pleural effusion or pneumothorax.   Musculoskeletal: Orthopedic hardware of the cervical spine. No chest wall abnormality. No acute or significant osseous findings.   CTA  ABDOMEN AND PELVIS FINDINGS   Hepatobiliary: No focal liver abnormality is seen. No gallstones, gallbladder wall thickening, or biliary dilatation.   Pancreas: Unremarkable. No pancreatic ductal dilatation or surrounding inflammatory changes.   Spleen: Normal in size without focal abnormality.   Adrenals/Urinary Tract: Bilateral adrenal glands are unremarkable. No hydronephrosis nephrolithiasis. Bilateral renal scarring. Simple appearing cyst of the mid region of the right kidney, no specific follow-up imaging is recommended. Bladder is unremarkable.   Stomach/Bowel: Stomach is within normal limits. Appendix appears normal. Circumferential wall thickening of the distal right colon seen on series 4, image 194. large rectal diverticulum versus evolving area of fat necrosis seen on series 4 image 231. No evidence of obstruction.   Vascular/lymphatic: Normal caliber abdominal aorta with mild atherosclerotic disease and no significant stenosis. No pathologically enlarged lymph nodes seen in the abdomen or pelvis.   Reproductive: Prostate is unremarkable.   Other: Fat containing structures of the left hemiabdomen on series 4, image 206, and series 4 image 178, consistent  with fat necrosis. No abdominal wall hernia or abnormality. No abdominopelvic ascites.   Musculoskeletal: No acute or significant osseous findings.   VASCULAR MEASUREMENTS PERTINENT TO TAVR:   AORTA:   Minimal Aortic Diameter-14.2 mm   Severity of Aortic Calcification-mild   RIGHT PELVIS:   Right Common Iliac Artery -   Minimal Diameter-7.9 mm   Tortuosity-none   Calcification-mild   Right External Iliac Artery -   Minimal Diameter-7.8 mm   Tortuosity-none   Calcification-none   Right Common Femoral Artery -   Minimal Diameter-5.6 mm   Tortuosity-none   Calcification-moderate   LEFT PELVIS:   Left Common Iliac Artery -   Minimal Diameter-7.8 mm   Tortuosity-none   Calcification-moderate   Left External Iliac Artery -   Minimal Diameter-7.4 mm   Tortuosity-none   Calcification-none   Left Common Femoral Artery -   Minimal Diameter-7.6 mm   Tortuosity-none   Calcification-none   Review of the MIP images confirms the above findings.   IMPRESSION: 1. Vascular findings and measurements pertinent to potential TAVR procedure, as detailed above. 2. Thickening and calcification of the aortic valve, compatible with reported clinical history of aortic stenosis. 3. Mild-to-moderate aortoiliac atherosclerosis. Left main and 3 vessel coronary artery disease. 4. Circumferential wall thickening of the distal right colon, possibly due to decompression, although neoplasm could have a similar appearance. Recommend further evaluation with colonoscopy. 5. Mild pulmonary edema.  Recent Labs: 10/06/2021: ALT 24 04/27/2022: TSH 0.361 04/28/2022: BUN 18; Creatinine, Ser 1.57; Hemoglobin 14.8; Magnesium 1.6; Platelets 219; Potassium 4.0; Sodium 135    Wt Readings from Last 3 Encounters:  05/21/22 99.2 kg  05/12/22 96.7 kg  04/28/22 100.7 kg    Assessment and Plan:  1. Severe Aortic Valve Stenosis: He has severe aortic valve stenosis. He is NYHA class 3. I  have personally reviewed the echo images. The aortic valve is thickened, calcified with limited leaflet mobility. Given recent fall in LVEF, will plan to move forward with AVR. He would be a candidate for either surgical AVR or TAVR but given his prior stroke and chronic back issues, he may be a better candidate for TAVR. He wishes to have TAVR if possible.    I have reviewed the natural history of aortic stenosis with the patient and their family members  who are present today. We have discussed the limitations of medical therapy and the poor prognosis associated with symptomatic aortic stenosis. We have reviewed potential treatment options, including  palliative medical therapy, conventional surgical aortic valve replacement, and transcatheter aortic valve replacement. We discussed treatment options in the context of the patient's specific comorbid medical conditions.   He has completed his cardiac cath and pre-TAVR CT scans. He would prefer to proceed with TAVR rather than surgical AVR given his poor ambulatory status post stroke and recent spinal surgery. Valve area 476 mm2 suitable for a 26 mm Edwards Sapien 3 Ultra valve or 29 mm Medtrontic Evolut valve. He appears to have transfemoral access for TAVR. The left side seems more favorable. He will have DCCV on 05/31/22 and then will see Dr. Tenny Craw with CT surgery on 06/03/22.   I will give him Lasix 40 mg to use as daily as needed for LE edema. BMET today.   I reviewed the incidental finding of thickened wall of the right colon. He will need a colonoscopy following his AVR.     Labs/ tests ordered today include:   Orders Placed This Encounter  Procedures   Basic Metabolic Panel (BMET)   Disposition:   F/U with the valve team.   Signed, Lauree Chandler, MD, [facility] 05/21/2022 3:24 PM    [facility] [address], [place], [place]  [address] Phone: [phone]; Fax: [phone]

## 2022-05-21 NOTE — Progress Notes (Signed)
Pre Surgical Assessment: 5 M Walk Test  43M=16.54f  5 Meter Walk Test- trial 1: 10.62 seconds 5 Meter Walk Test- trial 2: 8.60 seconds 5 Meter Walk Test- trial 3: 7.82 seconds 5 Meter Walk Test Average: 9.01 seconds  Assist of cane

## 2022-05-21 NOTE — Patient Instructions (Signed)
Medication Instructions:  Your physician has recommended you make the following change in your medication:  1.) start furosemide (Lasix) 40 mg - take one tablet daily as needed (swelling or shortness of breath)  *If you need a refill on your cardiac medications before your next appointment, please call your pharmacy*   Lab Work: Bmet today  If you have labs (blood work) drawn today and your tests are completely normal, you will receive your results only by: [place] (if you have MyChart) OR A paper copy in the mail If you have any lab test that is abnormal or we need to change your treatment, we will call you to review the results.   Testing/Procedures: As planned   Follow-Up: Per Structural Heart Team

## 2022-05-21 NOTE — Progress Notes (Signed)
Structural Heart Clinic Note  Chief Complaint  Patient presents with   Follow-up    Aortic stenosis   History of Present Illness: 67 yo male with history of diabetes, HTN, hyperlipidemia, prior CVA, cervical myelopathy with cord compression and moderate to severe aortic stenosis who is here today for cardiac follow up. I saw him as a new consult in November 2023 to discuss his aortic stenosis. He was planning to have cervical spine surgery in July 2023 and a murmur was noted on exam. Echo with LVEF=60-65%. Mild LVH. Trivial MR. Moderately severe to severe aortic valve stenosis with mean gradient of 39 mmHg, peak gradient 68.6 mmHg, AVA 0.82 cm2, DI 0.29, SVI 32. He had cord compression and his cervical surgery was felt to be urgent. He underwent cervical spine surgery on 10/28/21 with anterior cervical discectomies and did well with this. He was seen by Dr. Acie Fredrickson on 04/17/22 with LE edema, chest pain and dyspnea. EKG with atrial flutter with RVR. He was admitted to [facility], loaded with amiodarone, started on Eliquis. He underwent a TEE guided cardioversion. Echo 04/26/22 with LVEF around 35% with global hypokinesis. Mild MR. Severe low flow/low gradient AS with mean gradient 24 mmHg, peak gradient 39 mmHg, AVA 0.81 cm2, SVI 25, DI 0.33. Cardiac cath 04/27/22 with minimal non-obstructive CAD. Cardiac CTA 04/28/22 with aortic valve calcium score of 5671. Valve area 476 mm2 suitable for a 26 mm Edwards Sapien 3 Ultra valve or 29 mm Medtrontic Evolut valve. He was seen in our office 05/12/22 by Lily Kocher, PA-C and was in rated controlled atrial flutter. Cardioversion planned for 05/31/22.   He is here today for follow up. The patient denies any chest pain, palpitations, lower extremity edema, orthopnea, PND, dizziness, near syncope or syncope. He is feeling well overall but having some dyspnea with exertion.   He lives in [place], [place] with his wife. His daughter works in our office as a Technical brewer. He is retired from  [employer]. He has full dentures.   Primary Care Physician: Vivi Barrack, MD Primary Cardiologist: Nahser Referring Cardiologist: Nahser  Past Medical History:  Diagnosis Date   Cervical myelopathy [facility])    Diabetes mellitus without complication ([place])    Hypercholesterolemia    Hypertension    Severe aortic stenosis    Stroke [facility]) 1982   pt states he had a mini stroke in 1982    Past Surgical History:  Procedure Laterality Date   ANTERIOR CERVICAL DECOMP/DISCECTOMY FUSION N/A 10/28/2021   Procedure: ACDF - C3-C4 - C4-C5 - C5-C6;  Surgeon: Kary Kos, MD;  Location: [place];  Service: Neurosurgery;  Laterality: N/A;   BACK SURGERY     CARDIOVERSION N/A 04/28/2022   Procedure: CARDIOVERSION;  Surgeon: Buford Dresser, MD;  Location: [place];  Service: Cardiovascular;  Laterality: N/A;   HERNIA REPAIR     RIGHT/LEFT HEART CATH AND CORONARY ANGIOGRAPHY N/A 04/27/2022   Procedure: RIGHT/LEFT HEART CATH AND CORONARY ANGIOGRAPHY;  Surgeon: Troy Sine, MD;  Location: [place] CV LAB;  Service: Cardiovascular;  Laterality: N/A;   TEE WITHOUT CARDIOVERSION N/A 04/28/2022   Procedure: TRANSESOPHAGEAL ECHOCARDIOGRAM (TEE);  Surgeon: Buford Dresser, MD;  Location: [facility] ENDOSCOPY;  Service: Cardiovascular;  Laterality: N/A;    Current Outpatient Medications  Medication Sig Dispense Refill   amiodarone (PACERONE) 200 MG tablet Take 1 tablet by mouth twice a day for 7 days then reduce to 1 tablet by mouth daily 102 tablet 3   apixaban (ELIQUIS) 5 MG  TABS tablet Take 1 tablet (5 mg total) by mouth 2 (two) times daily. 180 tablet 3   Apple Cider Vinegar 500 MG TABS Take 500 mg by mouth at bedtime.     diclofenac sodium (VOLTAREN) 1 % GEL Apply 1 Application topically 4 (four) times daily as needed (pain).  4   FLUZONE HIGH-DOSE QUADRIVALENT 0.7 ML SUSY Inject 0.5 mLs into the muscle once.     furosemide (LASIX) 40 MG tablet Take 1 tablet (40 mg total) by mouth daily as  needed. 30 tablet 3   gabapentin (NEURONTIN) 400 MG capsule Take 400 mg by mouth 3 (three) times daily.     Ginger, Zingiber officinalis, (GINGER PO) Take 1 tablet by mouth daily.     glimepiride (AMARYL) 4 MG tablet Take 1 tablet (4 mg total) by mouth daily with breakfast. 90 tablet 3   Lancets (ONETOUCH DELICA PLUS 123XX123) MISC CHECK BLOOD SUGAR TWICE DAILY 200 each 1   levothyroxine (SYNTHROID) 150 MCG tablet TAKE ONE TABLET ONCE DAILY BEFORE BREAKFAST 90 tablet 0   metFORMIN (GLUCOPHAGE) 1000 MG tablet TAKE 1 TABLET (1,000 MG TOTAL) BY MOUTH TWICE A DAY WITH FOOD.  May restart on 05/01/22. 180 tablet 0   morphine (MSIR) 15 MG tablet Take 15 mg by mouth every 8 (eight) hours as needed for moderate pain.     ONETOUCH VERIO test strip CHECK BLOOD SUGAR UP TO THREE TIMES DAILY AS DIRECTED 100 strip 12   rosuvastatin (CRESTOR) 10 MG tablet Take 1 tablet (10 mg total) by mouth daily. 90 tablet 3   Turmeric (QC TUMERIC COMPLEX) 500 MG CAPS Take 500 mg by mouth at bedtime.     No current facility-administered medications for this visit.    Allergies  Allergen Reactions   Trulicity [Dulaglutide] Hives   Januvia [Sitagliptin] Other (See Comments)    Red eyes   Other     Stomach medicine, not sure of name. Had a mini stroke   Penicillins Swelling    Arm swelling Has patient had a PCN reaction causing immediate rash, facial/tongue/throat swelling, SOB or lightheadedness with hypotension: No Has patient had a PCN reaction causing severe rash involving mucus membranes or skin necrosis: No Has patient had a PCN reaction that required hospitalization No Has patient had a PCN reaction occurring within the last 10 years: No If all of the above answers are "NO", then may proceed with Cephalosporin use.    Social History   Socioeconomic History   Marital status: Married    Spouse name: Not on file   Number of children: 3   Years of education: Not on file   Highest education level: Not on file   Occupational History   Occupation: Retired from [employer], also drove a dump truck  Tobacco Use   Smoking status: Never   Smokeless tobacco: Never  Scientific laboratory technician Use: Never used  Substance and Sexual Activity   Alcohol use: Never    Comment: occ beer    Drug use: No   Sexual activity: Not on file  Other Topics Concern   Not on file  Social History Narrative   Not on file   Social Determinants of Health   Financial Resource Strain: [place]  (03/04/2022)   Overall Financial Resource Strain (CARDIA)    Difficulty of Paying Living Expenses: Not hard at all  Food Insecurity: No Food Insecurity (04/30/2022)   Hunger Vital Sign    Worried About Running Out of Food  in the Last Year: Never true    [place] in the Last Year: Never true  Transportation Needs: No Transportation Needs (04/30/2022)   PRAPARE - Hydrologist (Medical): No    Lack of Transportation (Non-Medical): No  Physical Activity: Insufficiently Active (03/04/2022)   Exercise Vital Sign    Days of Exercise per Week: 7 days    Minutes of Exercise per Session: 20 min  Stress: No Stress Concern Present (03/04/2022)   [place]    Feeling of Stress : Not at all  Social Connections: Moderately Isolated (03/04/2022)   Social Connection and Isolation Panel [NHANES]    Frequency of Communication with Friends and Family: Once a week    Frequency of Social Gatherings with Friends and Family: More than three times a week    Attends Religious Services: Never    Marine scientist or Organizations: No    Attends Archivist Meetings: Never    Marital Status: Married  Human resources officer Violence: Not At Risk (04/27/2022)   Humiliation, Afraid, Rape, and Kick questionnaire    Fear of Current or Ex-Partner: No    Emotionally Abused: No    Physically Abused: No    Sexually Abused: No    Family History  Problem  Relation Age of Onset   Diabetes Mother    Heart attack Mother    Hypertension Mother    Arthritis Mother    Skin cancer Father    Arthritis Father    Breast cancer Sister     Review of Systems:  As stated in the HPI and otherwise negative.   BP (!) 138/96   Pulse (!) 111   Ht 6' (1.829 m)   Wt 99.2 kg   SpO2 98%   BMI 29.65 kg/m   Physical Examination: General: Well developed, well nourished, NAD  HEENT: OP clear, mucus membranes moist  SKIN: warm, dry. No rashes. Neuro: No focal deficits  Musculoskeletal: Muscle strength 5/5 all ext  Psychiatric: Mood and affect normal  Neck: No JVD, no carotid bruits, no thyromegaly, no lymphadenopathy.  Lungs:Clear bilaterally, no wheezes, rhonci, crackles Cardiovascular: Irreg irreg. Harsh systolic murmur.  Abdomen:Soft. Bowel sounds present. Non-tender.  Extremities: Trace to 1+ bilateral lower extremity edema.   EKG:  EKG is not ordered today. The ekg ordered today demonstrates  Echo 04/26/22: 1. Global hypokinesis worse in inferior wall . Left ventricular ejection  fraction, by estimation, is 40 to 45%. The left ventricle has mildly  decreased function. The left ventricle demonstrates global hypokinesis.  The left ventricular internal cavity  size was moderately dilated. There is mild left ventricular hypertrophy.  Left ventricular diastolic parameters are consistent with Grade II  diastolic dysfunction (pseudonormalization). Elevated left ventricular  end-diastolic pressure.   2. Right ventricular systolic function is normal. The right ventricular  size is normal.   3. Left atrial size was moderately dilated.   4. The mitral valve is abnormal. Mild mitral valve regurgitation. No  evidence of mitral stenosis. Moderate mitral annular calcification.   5. AS remains severe Gradients low and DVI higher than TTE done 10/15/21  but EF has dropped considerably . The aortic valve is tricuspid. There is  severe calcifcation of the  aortic valve. There is severe thickening of the  aortic valve. Aortic valve  regurgitation is mild. No aortic stenosis is present.   6. Aortic dilatation noted. There is mild dilatation  of the aortic root,  measuring 40 mm. There is mild dilatation of the ascending aorta,  measuring 39 mm.   7. The inferior vena cava is normal in size with greater than 50%  respiratory variability, suggesting right atrial pressure of 3 mmHg.   FINDINGS   Left Ventricle: Global hypokinesis worse in inferior wall. Left  ventricular ejection fraction, by estimation, is 40 to 45%. The left  ventricle has mildly decreased function. The left ventricle demonstrates  global hypokinesis. The left ventricular  internal cavity size was moderately dilated. There is mild left  ventricular hypertrophy. Left ventricular diastolic parameters are  consistent with Grade II diastolic dysfunction (pseudonormalization).  Elevated left ventricular end-diastolic pressure.   Right Ventricle: The right ventricular size is normal. No increase in  right ventricular wall thickness. Right ventricular systolic function is  normal.   Left Atrium: Left atrial size was moderately dilated.   Right Atrium: Right atrial size was normal in size.   Pericardium: There is no evidence of pericardial effusion.   Mitral Valve: The mitral valve is abnormal. There is moderate thickening  of the mitral valve leaflet(s). There is moderate calcification of the  mitral valve leaflet(s). Moderate mitral annular calcification. Mild  mitral valve regurgitation. No evidence  of mitral valve stenosis.   Tricuspid Valve: The tricuspid valve is normal in structure. Tricuspid  valve regurgitation is not demonstrated. No evidence of tricuspid  stenosis.   Aortic Valve: AS remains severe Gradients low and DVI higher than TTE done  10/15/21 but EF has dropped considerably. The aortic valve is tricuspid.  There is severe calcifcation of the aortic  valve. There is severe  thickening of the aortic valve. Aortic  valve regurgitation is mild. Aortic regurgitation PHT measures 198 msec.  No aortic stenosis is present. Aortic valve mean gradient measures 24.0  mmHg. Aortic valve peak gradient measures 39.2 mmHg. Aortic valve area, by  VTI measures 0.93 cm.   Pulmonic Valve: The pulmonic valve was normal in structure. Pulmonic valve  regurgitation is not visualized. No evidence of pulmonic stenosis.   Aorta: Aortic dilatation noted. There is mild dilatation of the aortic  root, measuring 40 mm. There is mild dilatation of the ascending aorta,  measuring 39 mm.   Venous: The inferior vena cava is normal in size with greater than 50%  respiratory variability, suggesting right atrial pressure of 3 mmHg.   IAS/Shunts: No atrial level shunt detected by color flow Doppler.     LEFT VENTRICLE  PLAX 2D  LVIDd:         4.90 cm   Diastology  LVIDs:         4.00 cm   LV e' medial:    5.77 cm/s  LV PW:         1.40 cm   LV E/e' medial:  25.3  LV IVS:        1.20 cm   LV e' lateral:   9.57 cm/s  LVOT diam:     1.90 cm   LV E/e' lateral: 15.3  LV SV:         55  LV SV Index:   25  LVOT Area:     2.84 cm     RIGHT VENTRICLE  RV Basal diam:  2.90 cm  RV S prime:     9.57 cm/s  TAPSE (M-mode): 1.6 cm   LEFT ATRIUM             Index  RIGHT ATRIUM           Index  LA diam:        4.30 cm 1.96 cm/m   RA Area:     12.20 cm  LA Vol (A2C):   45.7 ml 20.84 ml/m  RA Volume:   27.70 ml  12.63 ml/m  LA Vol (A4C):   60.6 ml 27.64 ml/m  LA Biplane Vol: 54.7 ml 24.95 ml/m   AORTIC VALVE  AV Area (Vmax):    0.86 cm  AV Area (Vmean):   0.81 cm  AV Area (VTI):     0.93 cm  AV Vmax:           313.00 cm/s  AV Vmean:          233.000 cm/s  AV VTI:            0.589 m  AV Peak Grad:      39.2 mmHg  AV Mean Grad:      24.0 mmHg  LVOT Vmax:         95.30 cm/s  LVOT Vmean:        66.333 cm/s  LVOT VTI:          0.193 m  LVOT/AV VTI  ratio: 0.33  AI PHT:            198 msec    AORTA  Ao Root diam: 4.00 cm  Ao Asc diam:  3.90 cm   MITRAL VALVE  MV Area (PHT):              SHUNTS  MV Decel Time:              Systemic VTI:  0.19 m  MV E velocity: 146.00 cm/s  Systemic Diam: 1.90 cm    Cardiac cath 04/27/22: Mid RCA lesion is 20% stenosed.   Mid LM lesion is 10% stenosed.   Prox LAD lesion is 20% stenosed.   Mid Cx lesion is 20% stenosed.   There is severe aortic valve stenosis.   Mild coronary calcification with nonobstructive disease with smooth 10% distal left main stenosis, 20% proximal LAD stenosis, 20% AV groove circumflex stenosis, and 20% RCA stenosis.   Moderate right heart pressure elevation with moderate pulmonary hypertension with mean PA pressure 35 mmHg.   Severe aortic stenosis with a mean gradient 28.2, peak to peak gradient 33 mmHg, and calculated AVA 0.9 cm.   RECOMMENDATION: Patient will be treated with anticoagulation with his atrial flutter/fibrillation with probable attempted cardioversion. Ultimately will need structural heart team evaluation for aortic valve replacement.   Recommendations  Antiplatelet/Anticoag Heparin will be reinitiated in light of the patient's atrial fibrillation/atrial flutter.   Surgeon Notes    04/28/2022  8:37 AM CV Procedure signed by Buford Dresser, MD   Clinical Presentation  CHF/Shock Congestive heart failure not present. No shock present.   Procedural Details  Technical Details Mr. Thomas [name] is a 67 year old male who in July 2023 was found to have severe AS on echo with a mean gradient 39 peak gradient 68.6 mmHg and AVA 0.8 cm2.  EF was 60 to 65%.  He was seen yesterday by Dr. Acie Fredrickson and was in atrial flutter at heart rate of 130.  He was advised that he be hospitalized due to potential decompensation risk.  He was started on IV amiodarone and drip and heparinized.  A follow-up echo yesterday April 26, 2022 showed reduction in LV function  with EF at 40 to 45%  with peak gradient 39 mean gradient 24 mmHg and AVA 0.93.  He has continued to be in A-fib/flutter.  He is now referred for right and left heart cardiac catheterization.  Patient arrived to the Cath Lab in the fasting state.  He was premedicated with Versed 2 mg and fentanyl 25 mcg.  An IV was present in his right brachial vein.  This was exchanged for a 5 6 Glidesheath slender sheath.  A 5 French right heart catheter was then inserted and advanced to the RA, RV, PA, pulmonary capillary wedge positions.  Oxygen saturation was obtained x 2 in the pulmonary artery.  Guidance was used for right radial artery access.  The right radial artery was punctured via the Seldinger technique, and a 6 [place] Glidesheath Slender was inserted without difficulty.  A radial cocktail consisting of Verapamil 3 mg was administered. The patient received weight adjusted heparin. A safety J wire was advanced into the ascending aorta.  Initially, a 5 [place] JR4 catheter was inserted but this could not selectively adequately engage the RCA.  A 3 DRC catheter was then attempted unsuccessfully.  Ultimately a JL 4 catheter was needed for selective angiography in the left coronary system.  An AR-1 catheter was then used for angiography to the RCA.  The aortic valve was crossed with an AL-1 catheter and straight wire for left ventricular pressure recording.  All catheters were removed from the patient.  A TR radial band was applied for hemostasis.  The right heart cath was removed.  Hemostasis was obtained by manual pressure.  The patient left the catheterization laboratory in stable condition.     Estimated blood loss <50 mL.   During this procedure medications were administered to achieve and maintain moderate conscious sedation while the patient's heart rate, blood pressure, and oxygen saturation were continuously monitored and I was present face-to-face 100% of this time.   Medications (Filter: Administrations  occurring from 1533 to 1711 on 04/27/22)  important  Continuous medications are totaled by the amount administered until 04/27/22 1711.   Heparin (Porcine) in NaCl 1000-0.9 UT/500ML-% SOLN (mL)  Total volume: 1,000 mL Date/Time Rate/Dose/Volume Action   04/27/22 1544 500 mL Given   1544 500 mL Given   fentaNYL (SUBLIMAZE) injection (mcg)  Total dose: 25 mcg Date/Time Rate/Dose/Volume Action   04/27/22 1603 25 mcg Given   midazolam (VERSED) injection (mg)  Total dose: 2 mg Date/Time Rate/Dose/Volume Action   04/27/22 1603 2 mg Given   lidocaine (PF) (XYLOCAINE) 1 % injection (mL)  Total volume: 3 mL Date/Time Rate/Dose/Volume Action   04/27/22 1608 1 mL Given   1616 2 mL Given   Radial Cocktail/Verapamil only (mL)  Total volume: 10 mL Date/Time Rate/Dose/Volume Action   04/27/22 1620 10 mL Given   heparin sodium (porcine) injection (Units)  Total dose: 5,000 Units Date/Time Rate/Dose/Volume Action   04/27/22 1627 5,000 Units Given   iohexol (OMNIPAQUE) 350 MG/ML injection (mL)  Total volume: 110 mL Date/Time Rate/Dose/Volume Action   04/27/22 1704 110 mL Given   acetaminophen (TYLENOL) tablet 650 mg (mg)  Total dose: Cannot be calculated* Dosing weight: 100.9 *Administration dose not documented Date/Time Rate/Dose/Volume Action   04/27/22 1533 *Not included in total MAR Hold   amiodarone (PACERONE) tablet 400 mg (mg)  Total dose: Cannot be calculated* Dosing weight: 100.9 *Administration dose not documented Date/Time Rate/Dose/Volume Action   04/27/22 1533 *Not included in total MAR Hold   gabapentin (NEURONTIN) capsule 400 mg (mg)  Total dose: Cannot  be calculated* *Administration dose not documented Date/Time Rate/Dose/Volume Action   04/27/22 1533 *Not included in total MAR Hold   1600 *Not included in total Automatically Held   insulin aspart (novoLOG) injection 0-15 Units (Units)  Total dose: Cannot be calculated* Dosing weight: 100.9 *Administration dose not  documented Date/Time Rate/Dose/Volume Action   04/27/22 1533 *Not included in total MAR Hold   1700 *Not included in total Automatically Held   levothyroxine (SYNTHROID) tablet 150 mcg (mcg)  Total dose: Cannot be calculated* *Administration dose not documented Date/Time Rate/Dose/Volume Action   04/27/22 1533 *Not included in total MAR Hold   ondansetron (ZOFRAN) injection 4 mg (mg)  Total dose: Cannot be calculated* Dosing weight: 100.9 *Administration dose not documented Date/Time Rate/Dose/Volume Action   04/27/22 1533 *Not included in total MAR Hold   simvastatin (ZOCOR) tablet 40 mg (mg)  Total dose: Cannot be calculated* *Administration dose not documented Date/Time Rate/Dose/Volume Action   04/27/22 1533 *Not included in total MAR Hold    Sedation Time  Sedation Time Physician-1: 55 minutes 29 seconds Contrast  Medication Name Total Dose  iohexol (OMNIPAQUE) 350 MG/ML injection 110 mL   Radiation/Fluoro  Fluoro time: 19.6 (min) DAP: 37881 (mGycm2) Cumulative Air Kerma: 123456 (mGy) Complications  Complications documented before study signed (04/27/2022  [id number] PM)   No complications were associated with this study.  Documented by Gilford Rile, RT - 04/27/2022  4:59 PM     Coronary Findings  Diagnostic Dominance: Right Left Main  Mid LM lesion is 10% stenosed.    Left Anterior Descending  Prox LAD lesion is 20% stenosed.    Left Circumflex  Mid Cx lesion is 20% stenosed.    Right Coronary Artery  Mid RCA lesion is 20% stenosed.    Intervention   No interventions have been documented.   Right Heart  Right Atrium RA: A-wave 15; V wave 12; mean 10 RV: 61/18 PA 53/20; mean 35 PW A-wave 32, V wave 27; mean 26 Initial AO 99/68 Initial LV 136/19  LV pullback: LV: 138/13 AO: 101/63  Oxygen saturation in the PA 62% and in the central aorta 89%.  By the Fick method, cardiac output 5.8 L/min with cardiac index 2.6 L/min/m.  PVR: 1.6  WU  Arctic valve: Mean gradient 28.2, peak to peak gradient 33.0, aortic valve area 0.88 cm. LV:138/13   Left Heart  Aortic Valve There is severe aortic valve stenosis. The aortic valve is calcified. There is restricted aortic valve motion.   Coronary Diagrams  Diagnostic Dominance: Right  Intervention   Implants   No implant documentation for this case.   Syngo Images   Show images for CARDIAC CATHETERIZATION Images on Long Term Storage   Show images for Katelyn, Braxton to Procedure Log  Procedure Log    Hemo Data  Flowsheet Row Most Recent Value  Fick Cardiac Output 5.77 L/min  Fick Cardiac Output Index 2.59 (L/min)/BSA  Aortic Mean Gradient 28.21 mmHg  Aortic Peak Gradient 33 mmHg  Aortic Valve Area 0.88  Aortic Value Area Index 0.39 cm2/BSA  RA A Wave 15 mmHg  RA V Wave 12 mmHg  RA Mean 10 mmHg  RV Systolic Pressure 61 mmHg  RV Diastolic Pressure 5 mmHg  RV EDP 18 mmHg  PA Systolic Pressure 53 mmHg  PA Diastolic Pressure 20 mmHg  PA Mean 35 mmHg  PW A Wave 32 mmHg  PW V Wave 27 mmHg  PW Mean 26 mmHg  AO Systolic Pressure [id number]  mmHg  AO Diastolic Pressure 63 mmHg  AO Mean 80 mmHg  LV Systolic Pressure [id number] mmHg  LV Diastolic Pressure 8 mmHg  LV EDP 19 mmHg  AOp Systolic Pressure [id number] mmHg  AOp Diastolic Pressure 65 mmHg  AOp Mean Pressure 82 mmHg  LVp Systolic Pressure 0000000 mmHg  LVp Diastolic Pressure 8 mmHg  LVp EDP Pressure 13 mmHg  QP/QS 1  TPVR Index 13.53 HRUI  TSVR Index 30.15 HRUI  PVR SVR Ratio 0.13  TPVR/TSVR Ratio 0.45   Cardiac CT 04/28/22: FINDINGS: Image quality: Good   Aortic Valve:   Tricuspid aortic valve with severely reduced cusp excursion. Severely thickened and severely calcified aortic valve cusps.   AV calcium score: 5671   Virtual Basal Annulus Measurements:   Maximum/Minimum Diameter: 29.1 x 22.5 mm   Perimeter: 79.3 mm   Area:  476 mm2   Trivial LVOT calcifications.   Membranous septal length: 6.4  mm, above virtual annulus   Based on these measurements, the annulus would be suitable for a 26 mm Sapien 3 valve. Alternatively, Heart Team can consider 29 mm Evolut valve. Recommend Heart Team discussion for valve selection.   Sinus of Valsalva Measurements:   Non-coronary:  36 mm   Right - coronary:  36 mm   Left - coronary:  37 mm   Sinus of Valsalva Height:   Left: 27.7 mm   Right: 27.9 mm   Aorta: Conventional 3 vessel branch pattern of aortic arch. No coarctation of the aorta.   Sinotubular Junction:  37 mm   Ascending Thoracic Aorta:  39 mm   Aortic Arch:  28 mm   Descending Thoracic Aorta:  25 mm   Coronary Artery Height above Annulus:   Left main: 21.1 mm   Right coronary: 21 mm   Coronary Arteries: Normal coronary origin. Right dominance. The study was performed without use of NTG and insufficient for plaque evaluation. Coronary artery calcium seen in 3 vessel distribution. RCA origin at the level of the ST junction.   Optimum Fluoroscopic Angle for Delivery: LAO 0 CAU 0   OTHER:   Atria:   Left atrial appendage: Contrast mixing with incomplete opacification.   Mitral valve: Grossly normal, trivial mitral annular calcifications.   Pulmonary artery: Mild dilation, 34 mm.   Pulmonary veins: Normal anatomy.   IMPRESSION: 1. Tricuspid aortic valve with severely reduced cusp excursion. Severely thickened and severely calcified aortic valve cusps. 2. Aortic valve calcium score: 5671 3. Annulus area: 476 mm2, suitable for 26 mm Sapien 3 valve. Trivial LVOT calcifications. Membranous septal length 6 mm. 4. Sufficient coronary artery heights from annulus. 5. Optimum fluoroscopic angle for delivery: LAO 0 CAU 0  FINDINGS: CTA CHEST FINDINGS   Cardiovascular: Normal heart size. No pericardial effusion. Normal caliber thoracic aorta with mild atherosclerotic disease. Standard three-vessel aortic arch with no significant stenosis. Aortic  valve thickening and calcifications. Left main and 3 vessel coronary artery disease.   Mediastinum/Nodes: Mildly patulous esophagus. Thyroid is unremarkable. Mildly enlarged mediastinal and right hilar lymph nodes, likely reactive. Reference subcarinal lymph node measuring 1.2 cm in short axis on series 4, image 97. Reference right perihilar lymph node measuring 1.0 cm in short axis on series 4, image 94   Lungs/Pleura: Central airways are patent. Mild smooth septal thickening and diffuse ground-glass opacities. Right basilar atelectasis. No pleural effusion or pneumothorax.   Musculoskeletal: Orthopedic hardware of the cervical spine. No chest wall abnormality. No acute or significant osseous findings.   CTA  ABDOMEN AND PELVIS FINDINGS   Hepatobiliary: No focal liver abnormality is seen. No gallstones, gallbladder wall thickening, or biliary dilatation.   Pancreas: Unremarkable. No pancreatic ductal dilatation or surrounding inflammatory changes.   Spleen: Normal in size without focal abnormality.   Adrenals/Urinary Tract: Bilateral adrenal glands are unremarkable. No hydronephrosis nephrolithiasis. Bilateral renal scarring. Simple appearing cyst of the mid region of the right kidney, no specific follow-up imaging is recommended. Bladder is unremarkable.   Stomach/Bowel: Stomach is within normal limits. Appendix appears normal. Circumferential wall thickening of the distal right colon seen on series 4, image 194. large rectal diverticulum versus evolving area of fat necrosis seen on series 4 image 231. No evidence of obstruction.   Vascular/lymphatic: Normal caliber abdominal aorta with mild atherosclerotic disease and no significant stenosis. No pathologically enlarged lymph nodes seen in the abdomen or pelvis.   Reproductive: Prostate is unremarkable.   Other: Fat containing structures of the left hemiabdomen on series 4, image 206, and series 4 image 178, consistent  with fat necrosis. No abdominal wall hernia or abnormality. No abdominopelvic ascites.   Musculoskeletal: No acute or significant osseous findings.   VASCULAR MEASUREMENTS PERTINENT TO TAVR:   AORTA:   Minimal Aortic Diameter-14.2 mm   Severity of Aortic Calcification-mild   RIGHT PELVIS:   Right Common Iliac Artery -   Minimal Diameter-7.9 mm   Tortuosity-none   Calcification-mild   Right External Iliac Artery -   Minimal Diameter-7.8 mm   Tortuosity-none   Calcification-none   Right Common Femoral Artery -   Minimal Diameter-5.6 mm   Tortuosity-none   Calcification-moderate   LEFT PELVIS:   Left Common Iliac Artery -   Minimal Diameter-7.8 mm   Tortuosity-none   Calcification-moderate   Left External Iliac Artery -   Minimal Diameter-7.4 mm   Tortuosity-none   Calcification-none   Left Common Femoral Artery -   Minimal Diameter-7.6 mm   Tortuosity-none   Calcification-none   Review of the MIP images confirms the above findings.   IMPRESSION: 1. Vascular findings and measurements pertinent to potential TAVR procedure, as detailed above. 2. Thickening and calcification of the aortic valve, compatible with reported clinical history of aortic stenosis. 3. Mild-to-moderate aortoiliac atherosclerosis. Left main and 3 vessel coronary artery disease. 4. Circumferential wall thickening of the distal right colon, possibly due to decompression, although neoplasm could have a similar appearance. Recommend further evaluation with colonoscopy. 5. Mild pulmonary edema.  Recent Labs: 10/06/2021: ALT 24 04/27/2022: TSH 0.361 04/28/2022: BUN 18; Creatinine, Ser 1.57; Hemoglobin 14.8; Magnesium 1.6; Platelets 219; Potassium 4.0; Sodium 135    Wt Readings from Last 3 Encounters:  05/21/22 99.2 kg  05/12/22 96.7 kg  04/28/22 100.7 kg    Assessment and Plan:  1. Severe Aortic Valve Stenosis: He has severe aortic valve stenosis. He is NYHA class 3. I  have personally reviewed the echo images. The aortic valve is thickened, calcified with limited leaflet mobility. Given recent fall in LVEF, will plan to move forward with AVR. He would be a candidate for either surgical AVR or TAVR but given his prior stroke and chronic back issues, he may be a better candidate for TAVR. He wishes to have TAVR if possible.    I have reviewed the natural history of aortic stenosis with the patient and their family members  who are present today. We have discussed the limitations of medical therapy and the poor prognosis associated with symptomatic aortic stenosis. We have reviewed potential treatment options, including  palliative medical therapy, conventional surgical aortic valve replacement, and transcatheter aortic valve replacement. We discussed treatment options in the context of the patient's specific comorbid medical conditions.   He has completed his cardiac cath and pre-TAVR CT scans. He would prefer to proceed with TAVR rather than surgical AVR given his poor ambulatory status post stroke and recent spinal surgery. Valve area 476 mm2 suitable for a 26 mm Edwards Sapien 3 Ultra valve or 29 mm Medtrontic Evolut valve. He appears to have transfemoral access for TAVR. The left side seems more favorable. He will have DCCV on 05/31/22 and then will see Dr. Tenny Craw with CT surgery on 06/03/22.   I will give him Lasix 40 mg to use as daily as needed for LE edema. BMET today.   I reviewed the incidental finding of thickened wall of the right colon. He will need a colonoscopy following his AVR.     Labs/ tests ordered today include:   Orders Placed This Encounter  Procedures   Basic Metabolic Panel (BMET)   Disposition:   F/U with the valve team.   Signed, Lauree Chandler, MD, [facility] 05/21/2022 3:24 PM    [facility] [place], [place],   [address] Phone: [phone]; Fax: [phone]

## 2022-05-23 NOTE — Progress Notes (Unsigned)
[address]       [place] [address]             [phone]                    [name] [place] Medical Record [id number] Date of Birth: 05-Jun-1955  Referring: Vivi Barrack, MD Primary Care: Vivi Barrack, MD Primary Cardiologist: Mertie Moores, MD  Chief Complaint:   No chief complaint on file.   History of Present Illness:    Thomas [name] 67 y.o. male  who presents for evaluation of aortic stenosis. Mean gradient of 44m Hg. He also has had aflutter and a couple of heart failure hospitalizations and cardioversion.  I confirmed escalating dyspnea symptoms related to valvular heart disease.   Walks with a cane. Lives at home w wife.   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 Mouth fell open and couldn't shut it.   Cannot lay flat for hours due to pain.    Recent cardiology HPI : 67yo male with history of diabetes, HTN, hyperlipidemia, prior CVA, cervical myelopathy with cord compression and moderate to severe aortic stenosis who is here today for cardiac follow up. I saw him as a new consult in November 2023 to discuss his aortic stenosis. He was planning to have cervical spine surgery in July 2023 and a murmur was noted on exam. Echo with LVEF=60-65%. Mild LVH. Trivial MR. Moderately severe to severe aortic valve stenosis with mean gradient of 39 mmHg, peak gradient 68.6 mmHg, AVA 0.82 cm2, DI 0.29, SVI 32. He had cord compression and his cervical surgery was felt to be urgent. He underwent cervical spine surgery on 10/28/21 with anterior cervical discectomies and did well with this. He was seen by Dr. NAcie Fredricksonon 04/17/22 with LE edema, chest pain and dyspnea. EKG with atrial flutter with RVR. [facility] with amiodarone, started on Eliquis. He underwent a TEE guided cardioversion. Echo 04/26/22 with LVEF around 35% with global hypokinesis. Mild MR. Severe low flow/low gradient AS with mean gradient 24 mmHg, peak  gradient 39 mmHg, AVA 0.81 cm2, SVI 25, DI 0.33. Cardiac cath 04/27/22 with minimal non-obstructive CAD. Cardiac CTA 04/28/22 with aortic valve calcium score of 5671. Valve area 476 mm2 suitable for a 26 mm Edwards Sapien 3 Ultra valve or 29 mm Medtrontic Evolut valve. He was seen in our office 05/12/22 by ELily Kocher PA-C and was in rated controlled atrial flutter. Cardioversion planned for 05/31/22.    He is here today for follow up. The patient denies any chest pain, palpitations, lower extremity edema, orthopnea, PND, dizziness, near syncope or syncope. He is feeling well overall but having some dyspnea with exertion.    [place] His daughter works in our office as a CTechnical brewer He is retired from [employer] He has full dentures.   Past Medical History:  Diagnosis Date   Cervical myelopathy ([facility]    Diabetes mellitus without complication ([facility]    Hypercholesterolemia    Hypertension    Severe aortic stenosis    Stroke ([facility] 1982   pt states he had a mini stroke in 1982    Past Surgical History:  Procedure Laterality Date   ANTERIOR CERVICAL DECOMP/DISCECTOMY FUSION N/A 10/28/2021   Procedure: ACDF - C3-C4 - C4-C5 - C5-C6;  Surgeon: CKary Kos MD;  [place]  Service: Neurosurgery;  Laterality: N/A;   BACK SURGERY     CARDIOVERSION N/A 04/28/2022   Procedure: CARDIOVERSION;  Surgeon: Buford Dresser, MD;  Location: [place];  Service: Cardiovascular;  Laterality: N/A;   HERNIA REPAIR     RIGHT/LEFT HEART CATH AND CORONARY ANGIOGRAPHY N/A 04/27/2022   Procedure: RIGHT/LEFT HEART CATH AND CORONARY ANGIOGRAPHY;  Surgeon: Troy Sine, MD;  Location: [place] [facility];  Service: Cardiovascular;  Laterality: N/A;   TEE WITHOUT CARDIOVERSION N/A 04/28/2022   Procedure: TRANSESOPHAGEAL ECHOCARDIOGRAM (TEE);  Surgeon: Buford Dresser, MD;  Location: [facility] ENDOSCOPY;  Service: Cardiovascular;  Laterality: N/A;    Family History  Problem Relation  Age of Onset   Diabetes Mother    Heart attack Mother    Hypertension Mother    Arthritis Mother    Skin cancer Father    Arthritis Father    Breast cancer Sister      Social History   Tobacco Use  Smoking Status Never  Smokeless Tobacco Never    Social History   Substance and Sexual Activity  Alcohol Use Never   Comment: occ beer      Allergies  Allergen Reactions   Trulicity [Dulaglutide] Hives   Januvia [Sitagliptin] Other (See Comments)    Red eyes   Other     Stomach medicine, not sure of name. Had a mini stroke   Penicillins Swelling    Arm swelling Has patient had a PCN reaction causing immediate rash, facial/tongue/throat swelling, SOB or lightheadedness with hypotension: No Has patient had a PCN reaction causing severe rash involving mucus membranes or skin necrosis: No Has patient had a PCN reaction that required hospitalization No Has patient had a PCN reaction occurring within the last 10 years: No If all of the above answers are "NO", then may proceed with Cephalosporin use.    Current Outpatient Medications  Medication Sig Dispense Refill   amiodarone (PACERONE) 200 MG tablet Take 1 tablet by mouth twice a day for 7 days then reduce to 1 tablet by mouth daily 102 tablet 3   apixaban (ELIQUIS) 5 MG TABS tablet Take 1 tablet (5 mg total) by mouth 2 (two) times daily. 180 tablet 3   Apple Cider Vinegar 500 MG TABS Take 500 mg by mouth at bedtime.     diclofenac sodium (VOLTAREN) 1 % GEL Apply 1 Application topically 4 (four) times daily as needed (pain).  4   FLUZONE HIGH-DOSE QUADRIVALENT 0.7 ML SUSY Inject 0.5 mLs into the muscle once.     furosemide (LASIX) 40 MG tablet Take 1 tablet (40 mg total) by mouth daily as needed. 30 tablet 3   gabapentin (NEURONTIN) 400 MG capsule Take 400 mg by mouth 3 (three) times daily.     Ginger, Zingiber officinalis, (GINGER PO) Take 1 tablet by mouth daily.     glimepiride (AMARYL) 4 MG tablet Take 1 tablet (4 mg  total) by mouth daily with breakfast. 90 tablet 3   Lancets (ONETOUCH DELICA PLUS 123XX123) MISC CHECK BLOOD SUGAR TWICE DAILY 200 each 1   levothyroxine (SYNTHROID) 150 MCG tablet TAKE ONE TABLET ONCE DAILY BEFORE BREAKFAST 90 tablet 0   metFORMIN (GLUCOPHAGE) 1000 MG tablet TAKE 1 TABLET (1,000 MG TOTAL) BY MOUTH TWICE A DAY WITH FOOD.  May restart on 05/01/22. 180 tablet 0   morphine (MSIR) 15 MG tablet Take 15 mg by mouth every 8 (eight) hours as needed for moderate pain.     ONETOUCH VERIO test strip CHECK BLOOD SUGAR  UP TO THREE TIMES DAILY AS DIRECTED 100 strip 12   rosuvastatin (CRESTOR) 10 MG tablet Take 1 tablet (10 mg total) by mouth daily. 90 tablet 3   Turmeric (QC TUMERIC COMPLEX) 500 MG CAPS Take 500 mg by mouth at bedtime.     No current facility-administered medications for this visit.    ROS 14 point ROS reviewed and negative except as per HPI   PHYSICAL EXAMINATION: There were no vitals taken for this visit.  Gen: NAD Neuro: Alert and oriented CV: + systolic murmur Resp: Nonlaboured Abd: Soft, ntnd Extr: WWP  Diagnostic Studies & Laboratory data:     Recent Radiology Findings:   CT Angio Abd/Pel w/ and/or w/o  Result Date: 04/28/2022 CLINICAL DATA:  Preop evaluation for aortic valve replacement EXAM: CT ANGIOGRAPHY CHEST, ABDOMEN AND PELVIS TECHNIQUE: Multidetector CT imaging through the chest, abdomen and pelvis was performed using the standard protocol during bolus administration of intravenous contrast. Multiplanar reconstructed images and MIPs were obtained and reviewed to evaluate the vascular anatomy. RADIATION DOSE REDUCTION: This exam was performed according to the departmental dose-optimization program which includes automated exposure control, adjustment of the mA and/or kV according to patient size and/or use of iterative reconstruction technique. CONTRAST:  140m OMNIPAQUE IOHEXOL 350 MG/ML SOLN COMPARISON:  None Available. FINDINGS: CTA CHEST FINDINGS  Cardiovascular: Normal heart size. No pericardial effusion. Normal caliber thoracic aorta with mild atherosclerotic disease. Standard three-vessel aortic arch with no significant stenosis. Aortic valve thickening and calcifications. Left main and 3 vessel coronary artery disease. Mediastinum/Nodes: Mildly patulous esophagus. Thyroid is unremarkable. Mildly enlarged mediastinal and right hilar lymph nodes, likely reactive. Reference subcarinal lymph node measuring 1.2 cm in short axis on series 4, image 97. Reference right perihilar lymph node measuring 1.0 cm in short axis on series 4, image 94 Lungs/Pleura: Central airways are patent. Mild smooth septal thickening and diffuse ground-glass opacities. Right basilar atelectasis. No pleural effusion or pneumothorax. Musculoskeletal: Orthopedic hardware of the cervical spine. No chest wall abnormality. No acute or significant osseous findings. CTA ABDOMEN AND PELVIS FINDINGS Hepatobiliary: No focal liver abnormality is seen. No gallstones, gallbladder wall thickening, or biliary dilatation. Pancreas: Unremarkable. No pancreatic ductal dilatation or surrounding inflammatory changes. Spleen: Normal in size without focal abnormality. Adrenals/Urinary Tract: Bilateral adrenal glands are unremarkable. No hydronephrosis nephrolithiasis. Bilateral renal scarring. Simple appearing cyst of the mid region of the right kidney, no specific follow-up imaging is recommended. Bladder is unremarkable. Stomach/Bowel: Stomach is within normal limits. Appendix appears normal. Circumferential wall thickening of the distal right colon seen on series 4, image 194. large rectal diverticulum versus evolving area of fat necrosis seen on series 4 image 231. No evidence of obstruction. Vascular/lymphatic: Normal caliber abdominal aorta with mild atherosclerotic disease and no significant stenosis. No pathologically enlarged lymph nodes seen in the abdomen or pelvis. Reproductive: Prostate is  unremarkable. Other: Fat containing structures of the left hemiabdomen on series 4, image 206, and series 4 image 178, consistent with fat necrosis. No abdominal wall hernia or abnormality. No abdominopelvic ascites. Musculoskeletal: No acute or significant osseous findings. VASCULAR MEASUREMENTS PERTINENT TO TAVR: AORTA: Minimal Aortic Diameter-14.2 mm Severity of Aortic Calcification-mild RIGHT PELVIS: Right Common Iliac Artery - Minimal Diameter-7.9 mm Tortuosity-none Calcification-mild Right External Iliac Artery - Minimal Diameter-7.8 mm Tortuosity-none Calcification-none Right Common Femoral Artery - Minimal Diameter-5.6 mm Tortuosity-none Calcification-moderate LEFT PELVIS: Left Common Iliac Artery - Minimal Diameter-7.8 mm Tortuosity-none Calcification-moderate Left External Iliac Artery - Minimal Diameter-7.4 mm Tortuosity-none Calcification-none Left Common Femoral  Artery - Minimal Diameter-7.6 mm Tortuosity-none Calcification-none Review of the MIP images confirms the above findings. IMPRESSION: 1. Vascular findings and measurements pertinent to potential TAVR procedure, as detailed above. 2. Thickening and calcification of the aortic valve, compatible with reported clinical history of aortic stenosis. 3. Mild-to-moderate aortoiliac atherosclerosis. Left main and 3 vessel coronary artery disease. 4. Circumferential wall thickening of the distal right colon, possibly due to decompression, although neoplasm could have a similar appearance. Recommend further evaluation with colonoscopy. 5. Mild pulmonary edema. Electronically Signed   By: Yetta Glassman M.D.   On: 04/28/2022 12:32   CT ANGIO CHEST AORTA W/CM & OR WO/CM  Result Date: 04/28/2022 CLINICAL DATA:  Preop evaluation for aortic valve replacement EXAM: CT ANGIOGRAPHY CHEST, ABDOMEN AND PELVIS TECHNIQUE: Multidetector CT imaging through the chest, abdomen and pelvis was performed using the standard protocol during bolus administration of  intravenous contrast. Multiplanar reconstructed images and MIPs were obtained and reviewed to evaluate the vascular anatomy. RADIATION DOSE REDUCTION: This exam was performed according to the departmental dose-optimization program which includes automated exposure control, adjustment of the mA and/or kV according to patient size and/or use of iterative reconstruction technique. CONTRAST:  158m OMNIPAQUE IOHEXOL 350 MG/ML SOLN COMPARISON:  None Available. FINDINGS: CTA CHEST FINDINGS Cardiovascular: Normal heart size. No pericardial effusion. Normal caliber thoracic aorta with mild atherosclerotic disease. Standard three-vessel aortic arch with no significant stenosis. Aortic valve thickening and calcifications. Left main and 3 vessel coronary artery disease. Mediastinum/Nodes: Mildly patulous esophagus. Thyroid is unremarkable. Mildly enlarged mediastinal and right hilar lymph nodes, likely reactive. Reference subcarinal lymph node measuring 1.2 cm in short axis on series 4, image 97. Reference right perihilar lymph node measuring 1.0 cm in short axis on series 4, image 94 Lungs/Pleura: Central airways are patent. Mild smooth septal thickening and diffuse ground-glass opacities. Right basilar atelectasis. No pleural effusion or pneumothorax. Musculoskeletal: Orthopedic hardware of the cervical spine. No chest wall abnormality. No acute or significant osseous findings. CTA ABDOMEN AND PELVIS FINDINGS Hepatobiliary: No focal liver abnormality is seen. No gallstones, gallbladder wall thickening, or biliary dilatation. Pancreas: Unremarkable. No pancreatic ductal dilatation or surrounding inflammatory changes. Spleen: Normal in size without focal abnormality. Adrenals/Urinary Tract: Bilateral adrenal glands are unremarkable. No hydronephrosis nephrolithiasis. Bilateral renal scarring. Simple appearing cyst of the mid region of the right kidney, no specific follow-up imaging is recommended. Bladder is unremarkable.  Stomach/Bowel: Stomach is within normal limits. Appendix appears normal. Circumferential wall thickening of the distal right colon seen on series 4, image 194. large rectal diverticulum versus evolving area of fat necrosis seen on series 4 image 231. No evidence of obstruction. Vascular/lymphatic: Normal caliber abdominal aorta with mild atherosclerotic disease and no significant stenosis. No pathologically enlarged lymph nodes seen in the abdomen or pelvis. Reproductive: Prostate is unremarkable. Other: Fat containing structures of the left hemiabdomen on series 4, image 206, and series 4 image 178, consistent with fat necrosis. No abdominal wall hernia or abnormality. No abdominopelvic ascites. Musculoskeletal: No acute or significant osseous findings. VASCULAR MEASUREMENTS PERTINENT TO TAVR: AORTA: Minimal Aortic Diameter-14.2 mm Severity of Aortic Calcification-mild RIGHT PELVIS: Right Common Iliac Artery - Minimal Diameter-7.9 mm Tortuosity-none Calcification-mild Right External Iliac Artery - Minimal Diameter-7.8 mm Tortuosity-none Calcification-none Right Common Femoral Artery - Minimal Diameter-5.6 mm Tortuosity-none Calcification-moderate LEFT PELVIS: Left Common Iliac Artery - Minimal Diameter-7.8 mm Tortuosity-none Calcification-moderate Left External Iliac Artery - Minimal Diameter-7.4 mm Tortuosity-none Calcification-none Left Common Femoral Artery - Minimal Diameter-7.6 mm Tortuosity-none Calcification-none Review of the  MIP images confirms the above findings. IMPRESSION: 1. Vascular findings and measurements pertinent to potential TAVR procedure, as detailed above. 2. Thickening and calcification of the aortic valve, compatible with reported clinical history of aortic stenosis. 3. Mild-to-moderate aortoiliac atherosclerosis. Left main and 3 vessel coronary artery disease. 4. Circumferential wall thickening of the distal right colon, possibly due to decompression, although neoplasm could have a similar  appearance. Recommend further evaluation with colonoscopy. 5. Mild pulmonary edema. Electronically Signed   By: Yetta Glassman M.D.   On: 04/28/2022 12:32   CT CORONARY MORPH W/CTA COR W/SCORE W/CA W/CM &/OR WO/CM  Addendum Date: 04/28/2022   ADDENDUM REPORT: 04/28/2022 11:47 EXAM: OVER-READ INTERPRETATION  CT CHEST The following report is an over-read performed by radiologist Dr. Maudry Diego [facility] on 04/28/2022. This over-read does not include interpretation of cardiac or coronary anatomy or pathology. The cardiac TAVR interpretation by the cardiologist is attached. COMPARISON:  None. FINDINGS: Extracardiac findings will be described separately under dictation for contemporaneously obtained CTA chest, abdomen and pelvis. IMPRESSION: Please see separate dictation for contemporaneously obtained CTA chest, abdomen and pelvis dated 04/28/2022 for full description of relevant extracardiac findings. Electronically Signed   By: Yetta Glassman M.D.   On: 04/28/2022 11:47   Result Date: 04/28/2022 CLINICAL DATA:  Aortic valve replacement (TAVR), pre-op eval, severe aortic stenosis EXAM: Cardiac TAVR CT TECHNIQUE: The patient was scanned on a Siemens Force [id number] slice scanner. A 120 kV retrospective scan was triggered in the descending thoracic aorta at 111 HU's. Gantry rotation speed was 270 msecs and collimation was .9 mm. The 3D data set was reconstructed in 5% intervals of the R-R cycle. Systolic and diastolic phases were analyzed on a dedicated work station using MPR, MIP and VRT modes. The patient received 149m OMNIPAQUE IOHEXOL 350 MG/ML SOLN of contrast. FINDINGS: Image quality: Good Aortic Valve: Tricuspid aortic valve with severely reduced cusp excursion. Severely thickened and severely calcified aortic valve cusps. AV calcium score: 5671 Virtual Basal Annulus Measurements: Maximum/Minimum Diameter: 29.1 x 22.5 mm Perimeter: 79.3 mm Area:  476 mm2 Trivial LVOT calcifications.  Membranous septal length: 6.4 mm, above virtual annulus Based on these measurements, the annulus would be suitable for a 26 mm Sapien 3 valve. Alternatively, Heart Team can consider 29 mm Evolut valve. Recommend Heart Team discussion for valve selection. Sinus of Valsalva Measurements: Non-coronary:  36 mm Right - coronary:  36 mm Left - coronary:  37 mm Sinus of Valsalva Height: Left: 27.7 mm Right: 27.9 mm Aorta: Conventional 3 vessel branch pattern of aortic arch. No coarctation of the aorta. Sinotubular Junction:  37 mm Ascending Thoracic Aorta:  39 mm Aortic Arch:  28 mm Descending Thoracic Aorta:  25 mm Coronary Artery Height above Annulus: Left main: 21.1 mm Right coronary: 21 mm Coronary Arteries: Normal coronary origin. Right dominance. The study was performed without use of NTG and insufficient for plaque evaluation. Coronary artery calcium seen in 3 vessel distribution. RCA origin at the level of the ST junction. Optimum Fluoroscopic Angle for Delivery: LAO 0 CAU 0 OTHER: Atria: Left atrial appendage: Contrast mixing with incomplete opacification. Mitral valve: Grossly normal, trivial mitral annular calcifications. Pulmonary artery: Mild dilation, 34 mm. Pulmonary veins: Normal anatomy. IMPRESSION: 1. Tricuspid aortic valve with severely reduced cusp excursion. Severely thickened and severely calcified aortic valve cusps. 2. Aortic valve calcium score: 5671 3. Annulus area: 476 mm2, suitable for 26 mm Sapien 3 valve. Trivial LVOT calcifications. Membranous septal length 6 mm. 4.  Sufficient coronary artery heights from annulus. 5. Optimum fluoroscopic angle for delivery: LAO 0 CAU 0 Electronically Signed: By: Cherlynn Kaiser M.D. On: 04/28/2022 11:17   CARDIAC CATHETERIZATION  Result Date: 04/27/2022   Mid RCA lesion is 20% stenosed.   Mid LM lesion is 10% stenosed.   Prox LAD lesion is 20% stenosed.   Mid Cx lesion is 20% stenosed.   There is severe aortic valve stenosis. Mild coronary calcification  with nonobstructive disease with smooth 10% distal left main stenosis, 20% proximal LAD stenosis, 20% AV groove circumflex stenosis, and 20% RCA stenosis. Moderate right heart pressure elevation with moderate pulmonary hypertension with mean PA pressure 35 mmHg. Severe aortic stenosis with a mean gradient 28.2, peak to peak gradient 33 mmHg, and calculated AVA 0.9 cm. RECOMMENDATION: Patient will be treated with anticoagulation with his atrial flutter/fibrillation with probable attempted cardioversion. Ultimately will need structural heart team evaluation for aortic valve replacement.   DG Chest 2 View  Result Date: 04/26/2022 CLINICAL DATA:  Atrial fibrillation, tachycardia. EXAM: CHEST - 2 VIEW COMPARISON:  November 04, 2021 FINDINGS: The heart size and mediastinal contours are within normal limits. Both lungs are clear. Chronic elevation of right hemidiaphragm unchanged. The visualized skeletal structures are stable. IMPRESSION: No active cardiopulmonary disease. Electronically Signed   By: Abelardo Diesel M.D.   On: 04/26/2022 13:49   ECHOCARDIOGRAM COMPLETE  Result Date: 04/26/2022    ECHOCARDIOGRAM REPORT   Patient Name:   Thomas [name] Date of Exam: 04/26/2022 Medical Rec #:  [id number]       Height:       72.0 in Accession #:    [id number]      Weight:       214.0 lb Date of Birth:  1956/02/10       BSA:          2.193 m Patient Age:    68 years        BP:           112/81 mmHg Patient Gender: M               HR:           126 bpm. Exam Location:  [facility] Procedure: 2D Echo, Cardiac Doppler and Color Doppler Indications:    I35.0 Aortic Stenosis  History:        Patient has prior history of Echocardiogram examinations, most                 recent 10/15/2021. Stroke; Risk Factors:Hypertension, Diabetes                 and HLD.  Sonographer:    Marygrace Drought RCS Referring Phys: [place]  1. Global hypokinesis worse in inferior wall . Left ventricular ejection fraction,  by estimation, is 40 to 45%. The left ventricle has mildly decreased function. The left ventricle demonstrates global hypokinesis. The left ventricular internal cavity size was moderately dilated. There is mild left ventricular hypertrophy. Left ventricular diastolic parameters are consistent with Grade II diastolic dysfunction (pseudonormalization). Elevated left ventricular end-diastolic pressure.  2. Right ventricular systolic function is normal. The right ventricular size is normal.  3. Left atrial size was moderately dilated.  4. The mitral valve is abnormal. Mild mitral valve regurgitation. No evidence of mitral stenosis. Moderate mitral annular calcification.  5. AS remains severe Gradients low and DVI higher than TTE done 10/15/21 but EF has dropped considerably . The aortic  valve is tricuspid. There is severe calcifcation of the aortic valve. There is severe thickening of the aortic valve. Aortic valve regurgitation is mild. No aortic stenosis is present.  6. Aortic dilatation noted. There is mild dilatation of the aortic root, measuring 40 mm. There is mild dilatation of the ascending aorta, measuring 39 mm.  7. The inferior vena cava is normal in size with greater than 50% respiratory variability, suggesting right atrial pressure of 3 mmHg. FINDINGS  Left Ventricle: Global hypokinesis worse in inferior wall. Left ventricular ejection fraction, by estimation, is 40 to 45%. The left ventricle has mildly decreased function. The left ventricle demonstrates global hypokinesis. The left ventricular internal cavity size was moderately dilated. There is mild left ventricular hypertrophy. Left ventricular diastolic parameters are consistent with Grade II diastolic dysfunction (pseudonormalization). Elevated left ventricular end-diastolic pressure. Right Ventricle: The right ventricular size is normal. No increase in right ventricular wall thickness. Right ventricular systolic function is normal. Left Atrium: Left  atrial size was moderately dilated. Right Atrium: Right atrial size was normal in size. Pericardium: There is no evidence of pericardial effusion. Mitral Valve: The mitral valve is abnormal. There is moderate thickening of the mitral valve leaflet(s). There is moderate calcification of the mitral valve leaflet(s). Moderate mitral annular calcification. Mild mitral valve regurgitation. No evidence of mitral valve stenosis. Tricuspid Valve: The tricuspid valve is normal in structure. Tricuspid valve regurgitation is not demonstrated. No evidence of tricuspid stenosis. Aortic Valve: AS remains severe Gradients low and DVI higher than TTE done 10/15/21 but EF has dropped considerably. The aortic valve is tricuspid. There is severe calcifcation of the aortic valve. There is severe thickening of the aortic valve. Aortic valve regurgitation is mild. Aortic regurgitation PHT measures 198 msec. No aortic stenosis is present. Aortic valve mean gradient measures 24.0 mmHg. Aortic valve peak gradient measures 39.2 mmHg. Aortic valve area, by VTI measures 0.93 cm. Pulmonic Valve: The pulmonic valve was normal in structure. Pulmonic valve regurgitation is not visualized. No evidence of pulmonic stenosis. Aorta: Aortic dilatation noted. There is mild dilatation of the aortic root, measuring 40 mm. There is mild dilatation of the ascending aorta, measuring 39 mm. Venous: The inferior vena cava is normal in size with greater than 50% respiratory variability, suggesting right atrial pressure of 3 mmHg. IAS/Shunts: No atrial level shunt detected by color flow Doppler.  LEFT VENTRICLE PLAX 2D LVIDd:         4.90 cm   Diastology LVIDs:         4.00 cm   LV e' medial:    5.77 cm/s LV PW:         1.40 cm   LV E/e' medial:  25.3 LV IVS:        1.20 cm   LV e' lateral:   9.57 cm/s LVOT diam:     1.90 cm   LV E/e' lateral: 15.3 LV SV:         55 LV SV Index:   25 LVOT Area:     2.84 cm  RIGHT VENTRICLE RV Basal diam:  2.90 cm RV S prime:      9.57 cm/s TAPSE (M-mode): 1.6 cm LEFT ATRIUM             Index        RIGHT ATRIUM           Index LA diam:        4.30 cm 1.96 cm/m   RA Area:  12.20 cm LA Vol (A2C):   45.7 ml 20.84 ml/m  RA Volume:   27.70 ml  12.63 ml/m LA Vol (A4C):   60.6 ml 27.64 ml/m LA Biplane Vol: 54.7 ml 24.95 ml/m  AORTIC VALVE AV Area (Vmax):    0.86 cm AV Area (Vmean):   0.81 cm AV Area (VTI):     0.93 cm AV Vmax:           313.00 cm/s AV Vmean:          233.000 cm/s AV VTI:            0.589 m AV Peak Grad:      39.2 mmHg AV Mean Grad:      24.0 mmHg LVOT Vmax:         95.30 cm/s LVOT Vmean:        66.333 cm/s LVOT VTI:          0.193 m LVOT/AV VTI ratio: 0.33 AI PHT:            198 msec  AORTA Ao Root diam: 4.00 cm Ao Asc diam:  3.90 cm MITRAL VALVE MV Area (PHT):              SHUNTS MV Decel Time:              Systemic VTI:  0.19 m MV E velocity: 146.00 cm/s  Systemic Diam: 1.90 cm Jenkins Rouge MD Electronically signed by Jenkins Rouge MD Signature Date/Time: 04/26/2022/10:48:02 AM    Final        I have independently reviewed the above radiology studies  and reviewed the findings with the patient.   Recent Lab Findings: Lab Results  Component Value Date   WBC 7.7 04/28/2022   HGB 14.8 04/28/2022   HCT 43.2 04/28/2022   PLT 219 04/28/2022   GLUCOSE 185 (H) 05/21/2022   CHOL 91 04/27/2022   TRIG 77 04/27/2022   HDL 30 (L) 04/27/2022   LDLDIRECT 76.0 10/09/2020   LDLCALC 46 04/27/2022   ALT 24 10/06/2021   AST 21 10/06/2021   NA 139 05/21/2022   K 4.5 05/21/2022   CL 102 05/21/2022   CREATININE 1.81 (H) 05/21/2022   BUN 34 (H) 05/21/2022   CO2 23 05/21/2022   TSH 0.361 04/27/2022   HGBA1C 6.8 (H) 04/27/2022     Assessment / Plan:   Thomas [name] 67 y.o. male  who presents for evaluation of aortic stenosis.   His comorbidities include DM2, HTN< HLD, CKD stage III, prior CVA, cervical myelopathy s/p surgery last year and recent CHF admission in aflutter.   Meets criteria for AV  replacement.   It doesn't seem that atrial arrythmias in general are driving his main symptoms his stroke was not related to arrythmia more to drug reaction.   Thus working surgically on rhythm not worth recovery efforts in challenging back, neck, MSK walking with a cane physiology.   Plan for TAVr. Consider an  EP evaluation and ablation 6-12 mo after that if symptomatic from flutter.  If he has recurrent flutter still after that Convergent Hybrid Ablation is a good option.    Risks/benefits/alternatives of TAVR were discussed at length (90% standard recovery, 6-9% morbidity [any organ, typically access site vascular complication needing surgery, stroke, or pacemaker] and <1% mortality. Options are TAVR, SAVR or medical treatment. Patient understands SAVR is typically higher risk and takes longer recovery than TAVR in elderly patients. As well, discussed that medical Tx yields around  50% mortality for severe, symptomatic aortic stenosis in 2 years if left untreated, and this is an option.   Valve: 85m Sapien (Area 504 mm2) Bailout:  yes Access:  Transfemoral (min access diameter is 7.460m NYHA: II    Misc/procedural: None  Dental: Full dentures   The patient was counseled at length regarding treatment alternatives for management of severe symptomatic aortic stenosis. The risks and benefits of surgical intervention has been discussed in detail. Long-term prognosis with medical therapy was discussed. Alternative approaches such as conventional surgical aortic valve replacement, transcatheter aortic valve replacement, and palliative medical therapy were compared and contrasted at length. This discussion was placed in the context of the patient's own specific clinical presentation and past medical history. All of her questions have been addressed.   I  spent 60 minutes with  the patient face to face in counseling and coordination of care.    DaPierre Balinter 05/23/2022 2:07 PM

## 2022-05-26 ENCOUNTER — Other Ambulatory Visit: Payer: Self-pay

## 2022-05-26 DIAGNOSIS — E1159 Type 2 diabetes mellitus with other circulatory complications: Secondary | ICD-10-CM | POA: Diagnosis not present

## 2022-05-26 DIAGNOSIS — I350 Nonrheumatic aortic (valve) stenosis: Secondary | ICD-10-CM | POA: Diagnosis not present

## 2022-05-26 DIAGNOSIS — R011 Cardiac murmur, unspecified: Secondary | ICD-10-CM | POA: Diagnosis not present

## 2022-05-26 DIAGNOSIS — I152 Hypertension secondary to endocrine disorders: Secondary | ICD-10-CM | POA: Diagnosis not present

## 2022-05-26 LAB — BASIC METABOLIC PANEL
BUN/Creatinine Ratio: 16 (ref 10–24)
BUN: 30 mg/dL — ABNORMAL HIGH (ref 8–27)
CO2: 20 mmol/L (ref 20–29)
Calcium: 8.8 mg/dL (ref 8.6–10.2)
Chloride: 101 mmol/L (ref 96–106)
Creatinine, Ser: 1.88 mg/dL — ABNORMAL HIGH (ref 0.76–1.27)
Glucose: 142 mg/dL — ABNORMAL HIGH (ref 70–99)
Potassium: 4.5 mmol/L (ref 3.5–5.2)
Sodium: 142 mmol/L (ref 134–144)
eGFR: 39 mL/min/{1.73_m2} — ABNORMAL LOW (ref >59)

## 2022-05-27 ENCOUNTER — Encounter: Payer: Self-pay | Admitting: Radiology

## 2022-05-27 LAB — CBC
Hematocrit: 40.9 % (ref 37.5–51.0)
Hemoglobin: 14 g/dL (ref 13.0–17.7)
MCH: 31.3 pg (ref 26.6–33.0)
MCHC: 34.2 g/dL (ref 31.5–35.7)
MCV: 91 fL (ref 79–97)
Platelets: 270 10*3/uL (ref 150–450)
RBC: 4.48 x10E6/uL (ref 4.14–5.80)
RDW: 13.1 % (ref 11.6–15.4)
WBC: 10.7 10*3/uL (ref 3.4–10.8)

## 2022-05-31 ENCOUNTER — Encounter (HOSPITAL_COMMUNITY)
Admission: RE | Disposition: A | Discharge status: Home / Self Care | Payer: Self-pay | Source: Ambulatory Visit | Attending: Cardiology

## 2022-05-31 ENCOUNTER — Ambulatory Visit (HOSPITAL_COMMUNITY)
Admission: RE | Admit: 2022-05-31 | Discharge: 2022-05-31 | Disposition: A | Discharge status: Home / Self Care | Payer: Medicare Other | Source: Ambulatory Visit | Attending: Cardiology | Admitting: Cardiology

## 2022-05-31 ENCOUNTER — Ambulatory Visit (HOSPITAL_BASED_OUTPATIENT_CLINIC_OR_DEPARTMENT_OTHER): Payer: Medicare Other | Admitting: Anesthesiology

## 2022-05-31 ENCOUNTER — Ambulatory Visit (HOSPITAL_COMMUNITY): Payer: Medicare Other | Admitting: Anesthesiology

## 2022-05-31 DIAGNOSIS — E119 Type 2 diabetes mellitus without complications: Secondary | ICD-10-CM | POA: Diagnosis not present

## 2022-05-31 DIAGNOSIS — I4892 Unspecified atrial flutter: Secondary | ICD-10-CM

## 2022-05-31 DIAGNOSIS — I2510 Atherosclerotic heart disease of native coronary artery without angina pectoris: Secondary | ICD-10-CM

## 2022-05-31 DIAGNOSIS — I700 Atherosclerosis of aorta: Secondary | ICD-10-CM | POA: Diagnosis not present

## 2022-05-31 DIAGNOSIS — I272 Pulmonary hypertension, unspecified: Secondary | ICD-10-CM | POA: Diagnosis not present

## 2022-05-31 DIAGNOSIS — I491 Atrial premature depolarization: Secondary | ICD-10-CM | POA: Diagnosis not present

## 2022-05-31 DIAGNOSIS — E039 Hypothyroidism, unspecified: Secondary | ICD-10-CM | POA: Diagnosis not present

## 2022-05-31 DIAGNOSIS — Z79890 Hormone replacement therapy (postmenopausal): Secondary | ICD-10-CM | POA: Diagnosis not present

## 2022-05-31 DIAGNOSIS — I509 Heart failure, unspecified: Secondary | ICD-10-CM | POA: Insufficient documentation

## 2022-05-31 DIAGNOSIS — Z79899 Other long term (current) drug therapy: Secondary | ICD-10-CM | POA: Diagnosis not present

## 2022-05-31 DIAGNOSIS — I484 Atypical atrial flutter: Secondary | ICD-10-CM

## 2022-05-31 DIAGNOSIS — I110 Hypertensive heart disease with heart failure: Secondary | ICD-10-CM | POA: Diagnosis not present

## 2022-05-31 DIAGNOSIS — Z7901 Long term (current) use of anticoagulants: Secondary | ICD-10-CM | POA: Diagnosis not present

## 2022-05-31 DIAGNOSIS — E785 Hyperlipidemia, unspecified: Secondary | ICD-10-CM | POA: Diagnosis not present

## 2022-05-31 DIAGNOSIS — I4891 Unspecified atrial fibrillation: Secondary | ICD-10-CM | POA: Diagnosis not present

## 2022-05-31 DIAGNOSIS — Z7984 Long term (current) use of oral hypoglycemic drugs: Secondary | ICD-10-CM | POA: Insufficient documentation

## 2022-05-31 HISTORY — PX: CARDIOVERSION: SHX1299

## 2022-05-31 LAB — GLUCOSE, CAPILLARY: Glucose-Capillary: 102 mg/dL — ABNORMAL HIGH (ref 70–99)

## 2022-05-31 SURGERY — CARDIOVERSION
Anesthesia: General

## 2022-05-31 MED ORDER — SODIUM CHLORIDE 0.9 % IV SOLN: INTRAVENOUS | Status: DC

## 2022-05-31 MED ORDER — LIDOCAINE 2% (20 MG/ML) 5 ML SYRINGE
INTRAMUSCULAR | Status: DC | PRN
  Administered 2022-05-31: 60 mg via INTRAVENOUS

## 2022-05-31 MED ORDER — PROPOFOL 10 MG/ML IV BOLUS
INTRAVENOUS | Status: DC | PRN
  Administered 2022-05-31: 50 mg via INTRAVENOUS
  Administered 2022-05-31: 20 mg via INTRAVENOUS

## 2022-05-31 NOTE — Discharge Instructions (Signed)

## 2022-05-31 NOTE — Anesthesia Preprocedure Evaluation (Addendum)
Anesthesia Evaluation  Patient identified by MRN, date of birth, ID band Patient awake    Reviewed: Allergy & Precautions, NPO status , Patient's Chart, lab work & pertinent test results  Airway Mallampati: II  TM Distance: >3 FB Neck ROM: Full    Dental  (+) Edentulous Upper, Edentulous Lower   Pulmonary neg pulmonary ROS   Pulmonary exam normal        Cardiovascular hypertension, + CAD and +CHF  Normal cardiovascular exam+ dysrhythmias Atrial Fibrillation + Valvular Problems/Murmurs AS      Neuro/Psych Cervical myelopathy CVA  negative psych ROS   GI/Hepatic negative GI ROS,,,(+)     substance abuse    Endo/Other  diabetesHypothyroidism    Renal/GU Renal InsufficiencyRenal disease     Musculoskeletal negative musculoskeletal ROS (+)  narcotic dependent  Abdominal   Peds  Hematology  (+) Blood dyscrasia (Eliquis)   Anesthesia Other Findings A-FIB  Reproductive/Obstetrics                             Anesthesia Physical Anesthesia Plan  ASA: 4  Anesthesia Plan: General   Post-op Pain Management:    Induction: Intravenous  PONV Risk Score and Plan: 2 and Propofol infusion and Treatment may vary due to age or medical condition  Airway Management Planned: Simple Face Mask  Additional Equipment:   Intra-op Plan:   Post-operative Plan:   Informed Consent: I have reviewed the patients History and Physical, chart, labs and discussed the procedure including the risks, benefits and alternatives for the proposed anesthesia with the patient or authorized representative who has indicated his/her understanding and acceptance.     Dental advisory given  Plan Discussed with: CRNA  Anesthesia Plan Comments:         Anesthesia Quick Evaluation

## 2022-05-31 NOTE — Anesthesia Postprocedure Evaluation (Signed)
Anesthesia Post Note  Patient: Thomas [name]  Procedure(s) Performed: CARDIOVERSION     Patient location during evaluation: Endoscopy Anesthesia Type: General Level of consciousness: awake Pain management: pain level controlled Vital Signs Assessment: post-procedure vital signs reviewed and stable Respiratory status: spontaneous breathing, nonlabored ventilation and respiratory function stable Cardiovascular status: blood pressure returned to baseline and stable Postop Assessment: no apparent nausea or vomiting Anesthetic complications: no   No notable events documented.  Last Vitals:  Vitals:   05/31/22 0946 05/31/22 0949  BP: 108/64   Pulse: (!) 57 60  Resp: 16 17  Temp:    SpO2: 92% 94%    Last Pain:  Vitals:   05/31/22 0946  TempSrc:   PainSc: 0-No pain                 Darnell Stimson P Ahamed Hofland

## 2022-05-31 NOTE — CV Procedure (Signed)
Procedure: Electrical Cardioversion Indications:  Atrial Flutter  Procedure Details:  Consent: Risks of procedure as well as the alternatives and risks of each were explained to the (patient/caregiver).  Consent for procedure obtained.  Time Out: Verified patient identification, verified procedure, site/side was marked, verified correct patient position, special equipment/implants available, medications/allergies/relevent history reviewed, required imaging and test results available. PERFORMED.  Patient placed on cardiac monitor, pulse oximetry, supplemental oxygen as necessary.  Sedation given:  Propofol '70mg'$ ; lidocaine '60mg'$  Pacer pads placed anterior and posterior chest.  Cardioverted 1 time(s).  Cardioversion with synchronized biphasic 150J shock.  Evaluation: Findings: Post procedure EKG shows: NSR with PACs Complications: None Patient did tolerate procedure well.  Time Spent Directly with the Patient:  72mnutes   HFreada Bergeron3/06/2022, 9:21 AM

## 2022-05-31 NOTE — Transfer of Care (Signed)
Immediate Anesthesia Transfer of Care Note  Patient: ANJUAN MESTON  Procedure(s) Performed: CARDIOVERSION  Patient Location: PACU  Anesthesia Type:General  Level of Consciousness: awake, alert , and oriented  Airway & Oxygen Therapy: Patient Spontanous Breathing and Patient connected to face mask oxygen  Post-op Assessment: Report given to RN, Post -op Vital signs reviewed and stable, and Patient moving all extremities X 4  Post vital signs: Reviewed and stable  Last Vitals:  Vitals Value Taken Time  BP    Temp    Pulse 57 05/31/22 0921  Resp 21 05/31/22 0921  SpO2 98 % 05/31/22 0921  Vitals shown include unvalidated device data.  Last Pain:  Vitals:   05/31/22 0838  TempSrc: Temporal  PainSc: 0-No pain         Complications: No notable events documented.

## 2022-05-31 NOTE — Interval H&P Note (Signed)
History and Physical Interval Note:  05/31/2022 8:50 AM  Thomas [name]  has presented today for surgery, with the diagnosis of AFIB.  The various methods of treatment have been discussed with the patient and family. After consideration of risks, benefits and other options for treatment, the patient has consented to  Procedure(s): CARDIOVERSION (N/A) as a surgical intervention.  The patient's history has been reviewed, patient examined, no change in status, stable for surgery.  I have reviewed the patient's chart and labs.  Questions were answered to the patient's satisfaction.     [name]

## 2022-06-03 ENCOUNTER — Encounter (HOSPITAL_COMMUNITY): Payer: Self-pay | Admitting: Cardiology

## 2022-06-03 ENCOUNTER — Institutional Professional Consult (permissible substitution): Payer: Medicare Other | Admitting: Cardiothoracic Surgery

## 2022-06-03 VITALS — BP 136/73 | HR 68 | Resp 20 | Ht 72.0 in | Wt 210.0 lb

## 2022-06-03 DIAGNOSIS — I350 Nonrheumatic aortic (valve) stenosis: Secondary | ICD-10-CM

## 2022-06-08 DIAGNOSIS — M5451 Vertebrogenic low back pain: Secondary | ICD-10-CM | POA: Diagnosis not present

## 2022-06-08 DIAGNOSIS — Z79891 Long term (current) use of opiate analgesic: Secondary | ICD-10-CM | POA: Diagnosis not present

## 2022-06-08 DIAGNOSIS — G894 Chronic pain syndrome: Secondary | ICD-10-CM | POA: Diagnosis not present

## 2022-06-08 DIAGNOSIS — M25569 Pain in unspecified knee: Secondary | ICD-10-CM | POA: Diagnosis not present

## 2022-06-08 DIAGNOSIS — M542 Cervicalgia: Secondary | ICD-10-CM | POA: Diagnosis not present

## 2022-06-08 DIAGNOSIS — M961 Postlaminectomy syndrome, not elsewhere classified: Secondary | ICD-10-CM | POA: Diagnosis not present

## 2022-06-08 DIAGNOSIS — M4322 Fusion of spine, cervical region: Secondary | ICD-10-CM | POA: Diagnosis not present

## 2022-06-14 ENCOUNTER — Encounter: Payer: Self-pay | Admitting: Cardiovascular Disease

## 2022-06-14 ENCOUNTER — Telehealth: Payer: Self-pay | Admitting: *Deleted

## 2022-06-14 NOTE — Telephone Encounter (Signed)
Fmla forms for patient's daughter, Meda Coffee have been filled in and signed by Dr. Angelena Form and placed at front desk for completion.

## 2022-06-14 NOTE — Progress Notes (Signed)
Cardiology Office Note:    Date:  06/24/2022   ID:  Thomas [name], DOB 1955/10/25, MRN [id number]  PCP:  Thomas Barrack, MD   [facility] Providers Cardiologist:  Thomas [name]   Referring MD: Thomas Barrack, MD   Chief Complaint  Patient presents with   Aortic Stenosis     History of Present Illness:    Thomas [name] is a 67 y.o. male with a hx of DM, HLD, HTN, stroke. He was recently scheduled to have some neck  surgery with Dr. Saintclair [name].  He had an echocardiogram prior to his scheduled back surgery and he was found to have severe aortic stenosis.  Has occasional arm numbness / weakness.   Scheduled him for a working visit to discuss his aortic stenosis. His daughter is Thomas [name] ( our Psychologist, sport and exercise )   Seen with wife, Thomas [name].  Has been told he has a murmur years ago.  Has not had any cardiac issues related to his AS No cp, dyspnea, syncope, no presyncope  Is not limited from a cardiology standpoint Able to do yard work without any CP or dyspnea   Walks with a cane because of his R leg nerve / back issues.   Occasional palpitations that last for 1-2 seconds .      Avoids salt, Eats a fairly healthy diet Worked in [employer] mile [employer], and [employer] )  Drove a dump truck,  has been a Dealer   I have discussed the case with Dr. Saintclair [name]. Thomas [name] is myelopathic - has ongoing  compression of his spinal cord.  The case would be about 2.5 hours. Anticipated blood loss would be minimal .    Oct. 24, 2023  Thomas [name] is seen for follow up of his AS, recent neck surgery  His numbness in his arms is better  Still has lowe back issues with numbness in his R leg - no plans for surgery .   Jan. 29, 2024  Thomas [name] is seen today for leg swelling , worsening chest pain  Was found to have atrial flutter on ECG today   Avoids salt   Not much exercise  Not inclined   Performed CSM today ,  was able to demonstrate A-flutter waves - was not able  to record on paper   Some shortness of breath .  Not necessarliy worse than usual  Wt is 222 lbs ( up 4 lbs from last visit in Oct. )   No CP .   Can feel the heart fluttering on occasion    June 15, 2022 Thomas [name] is seen for follow up of his atrial flutter,   seen with wife and daughter Thomas [name] .   He is s/p cardioversion on March 4. LV EF is 40-45%,  grade II DD  Mild MR  Has maintained sinus sinus rhythm    His TAVR has been scheduled for a month from now. He has requested that his daughters FMLA be extended for a month which is very reasonable   Can cancel appt with me on April 1 .     Past Medical History:  Diagnosis Date   Cervical myelopathy ([place])    Diabetes mellitus without complication ([place])    Hypercholesterolemia    Hypertension    Severe aortic stenosis    Stroke ([place]) 1982   pt states he had a mini stroke in 1982    Past Surgical History:  Procedure Laterality Date   ANTERIOR CERVICAL  DECOMP/DISCECTOMY FUSION N/A 10/28/2021   Procedure: ACDF - C3-C4 - C4-C5 - C5-C6;  Surgeon: Thomas Kos, MD;  Location: [place];  Service: Neurosurgery;  Laterality: N/A;   BACK SURGERY     CARDIOVERSION N/A 04/28/2022   Procedure: CARDIOVERSION;  Surgeon: Thomas Dresser, MD;  Location: [place];  Service: Cardiovascular;  Laterality: N/A;   CARDIOVERSION N/A 05/31/2022   Procedure: CARDIOVERSION;  Surgeon: Thomas Bergeron, MD;  Location: [facility] ENDOSCOPY;  Service: Cardiovascular;  Laterality: N/A;   HERNIA REPAIR     RIGHT/LEFT HEART CATH AND CORONARY ANGIOGRAPHY N/A 04/27/2022   Procedure: RIGHT/LEFT HEART CATH AND CORONARY ANGIOGRAPHY;  Surgeon: Thomas Sine, MD;  Location: [place] CV LAB;  Service: Cardiovascular;  Laterality: N/A;   TEE WITHOUT CARDIOVERSION N/A 04/28/2022   Procedure: TRANSESOPHAGEAL ECHOCARDIOGRAM (TEE);  Surgeon: Thomas Dresser, MD;  Location: [facility] ENDOSCOPY;  Service: Cardiovascular;  Laterality: N/A;    Current  Medications: Current Meds  Medication Sig   amiodarone (PACERONE) 200 MG tablet Take 1 tablet by mouth twice a day for 7 days then reduce to 1 tablet by mouth daily   apixaban (ELIQUIS) 5 MG TABS tablet Take 1 tablet (5 mg total) by mouth 2 (two) times daily.   Apple Cider Vinegar 500 MG TABS Take 500 mg by mouth at bedtime.   diclofenac sodium (VOLTAREN) 1 % GEL Apply 1 Application topically 4 (four) times daily as needed (pain).   furosemide (LASIX) 40 MG tablet Take 1 tablet (40 mg total) by mouth daily as needed.   gabapentin (NEURONTIN) 400 MG capsule Take 400 mg by mouth 3 (three) times daily.   Ginger, Zingiber officinalis, (GINGER PO) Take 1 tablet by mouth daily.   glimepiride (AMARYL) 4 MG tablet Take 1 tablet (4 mg total) by mouth daily with breakfast.   Lancets (ONETOUCH DELICA PLUS 123XX123) MISC CHECK BLOOD SUGAR TWICE DAILY   levothyroxine (SYNTHROID) 150 MCG tablet TAKE ONE TABLET ONCE DAILY BEFORE BREAKFAST   metFORMIN (GLUCOPHAGE) 1000 MG tablet TAKE 1 TABLET (1,000 MG TOTAL) BY MOUTH TWICE A DAY WITH FOOD.  May restart on 05/01/22.   morphine (MSIR) 15 MG tablet Take 15 mg by mouth every 8 (eight) hours as needed for moderate pain.   ONETOUCH VERIO test strip CHECK BLOOD SUGAR UP TO THREE TIMES DAILY AS DIRECTED   rosuvastatin (CRESTOR) 10 MG tablet Take 1 tablet (10 mg total) by mouth daily.   Turmeric (QC TUMERIC COMPLEX) 500 MG CAPS Take 500 mg by mouth at bedtime.     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[employer], also drove a dump truck  Tobacco Use   Smoking status: Never   Smokeless tobacco: Never  Scientific laboratory technician Use: Never used  Substance and Sexual Activity    Alcohol use: Never    Comment: occ beer    Drug use: No   Sexual activity: Not on file  Other Topics Concern   Not on file  Social History Narrative   Not on file   Social Determinants of Health   Financial Resource Strain: Low Risk  (03/04/2022)   Overall Financial Resource Strain (CARDIA)    Difficulty of  Paying Living Expenses: Not hard at all  Food Insecurity: No Food Insecurity (04/30/2022)   Hunger Vital Sign    Worried About Running Out of Food in the Last Year: Never true    Ran Out of Food in the Last Year: Never true  Transportation Needs: No Transportation Needs (04/30/2022)   PRAPARE - Hydrologist (Medical): No    Lack of Transportation (Non-Medical): No  Physical Activity: Insufficiently Active (03/04/2022)   Exercise Vital Sign    Days of Exercise per Week: 7 days    Minutes of Exercise per Session: 20 min  Stress: No Stress Concern Present (03/04/2022)   [place]    Feeling of Stress : Not at all  Social Connections: Moderately Isolated (03/04/2022)   Social Connection and Isolation Panel [NHANES]    Frequency of Communication with Friends and Family: Once a week    Frequency of Social Gatherings with Friends and Family: More than three times a week    Attends Religious Services: Never    Marine scientist or Organizations: No    Attends Music therapist: Never    Marital Status: Married     Family History: The patient's family history includes Arthritis in his father and mother; Breast cancer in his sister; Diabetes in his mother; Heart attack in his mother; Hypertension in his mother; Skin cancer in his father.  ROS:   Please see the history of present illness.     All other systems reviewed and are negative.  EKGs/Labs/Other Studies Reviewed:    The following studies were reviewed today:   EKG:     Recent Labs: 10/06/2021: ALT  24 04/27/2022: TSH 0.361 04/28/2022: Magnesium 1.6 05/26/2022: BUN 30; Creatinine, Ser 1.88; Hemoglobin 14.0; Platelets 270; Potassium 4.5; Sodium 142  Recent Lipid Panel    Component Value Date/Time   CHOL 91 04/27/2022 0334   TRIG 77 04/27/2022 0334   HDL 30 (L) 04/27/2022 0334   CHOLHDL 3.0 04/27/2022 0334   VLDL 15 04/27/2022 0334   LDLCALC 46 04/27/2022 0334   LDLDIRECT 76.0 10/09/2020 0840     Risk Assessment/Calculations:           Physical Exam:    Physical Exam: Blood pressure 118/62, pulse 73, height 6' (1.829 m), weight 219 lb (99.3 kg), SpO2 95 %.   GEN:  Well nourished, well developed in no acute distress HEENT: Normal NECK: No JVD; No carotid bruits LYMPHATICS: No lymphadenopathy CARDIAC: RRR 3/6 harsh systolic murmur radiating to his RSB  RESPIRATORY:  Clear to auscultation without rales, wheezing or rhonchi  ABDOMEN: Soft, non-tender, non-distended MUSCULOSKELETAL:  2  + pitting edema  No deformity  SKIN: Warm and dry NEUROLOGIC:  Alert and oriented x 3    ASSESSMENT:    1. Severe aortic stenosis       PLAN:      Aortic stenosis: He is scheduled for TAVR in mid April.  I would like to extend his daughter's FMLA leave for at least another month until the end of April so that she can help care for him during this time.   2.  Acute on chronic combined systolic and diastolic congestive heart failure:   lungs sound clear  Has persistent leg edema.  I suspect he has some venous insufficiency at this point.  I have advised him to elevate his legs on a regular basis.  He should wear compression hose  after leg elevation.   2.  HLD :    3.  HTN:    Blood pressure is well-controlled.       Medication Adjustments/Labs and Tests Ordered: Current medicines are reviewed at length with the patient today.  Concerns regarding medicines are outlined above.  No orders of the defined types were placed in this encounter.  No orders of the defined types were  placed in this encounter.      Patient Instructions      Your next appointment:      The format for your next appointment:   In Person  Provider: Dr Acie Fredrickson              Signed, Mertie Moores, MD  06/24/2022 3:23 PM    [place]

## 2022-06-15 ENCOUNTER — Encounter: Payer: Self-pay | Admitting: Cardiovascular Disease

## 2022-06-15 ENCOUNTER — Ambulatory Visit: Payer: Medicare Other | Attending: Cardiovascular Disease | Admitting: Cardiovascular Disease

## 2022-06-15 VITALS — BP 118/62 | HR 73 | Ht 72.0 in | Wt 219.0 lb

## 2022-06-15 DIAGNOSIS — I350 Nonrheumatic aortic (valve) stenosis: Secondary | ICD-10-CM

## 2022-06-15 DIAGNOSIS — Z0279 Encounter for issue of other medical certificate: Secondary | ICD-10-CM

## 2022-06-15 NOTE — Patient Instructions (Signed)
Medication Instructions:  NONE *If you need a refill on your cardiac medications before your next appointment, please call your pharmacy*   Lab Work: NONE If you have labs (blood work) drawn today and your tests are completely normal, you will receive your results only by: [place] (if you have MyChart) OR A paper copy in the mail If you have any lab test that is abnormal or we need to change your treatment, we will call you to review the results.   Testing/Procedures: NONE   Follow-Up: At [facility], you and your health needs are our priority.  As part of our continuing mission to provide you with exceptional heart care, we have created designated Provider Care Teams.  These Care Teams include your primary Cardiologist (physician) and Advanced Practice Providers (APPs -  Physician Assistants and Nurse Practitioners) who all work together to provide you with the care you need, when you need it.  We recommend signing up for the patient portal called "MyChart".  Sign up information is provided on this After Visit Summary.  MyChart is used to connect with patients for Virtual Visits (Telemedicine).  Patients are able to view lab/test results, encounter notes, upcoming appointments, etc.  Non-urgent messages can be sent to your provider as well.   To learn more about what you can do with MyChart, go to [email or web address].    Your next appointment:   6 month(s)  Provider:   Mertie Moores, MD

## 2022-06-16 NOTE — Telephone Encounter (Signed)
Completed form for daughter faxed to [facility] and scanned into patient's documents. Original given to daughter, Lynn Ito.

## 2022-06-25 ENCOUNTER — Encounter: Payer: Self-pay | Admitting: Cardiovascular Disease

## 2022-06-25 ENCOUNTER — Other Ambulatory Visit: Payer: Self-pay

## 2022-06-25 DIAGNOSIS — I350 Nonrheumatic aortic (valve) stenosis: Secondary | ICD-10-CM

## 2022-06-28 ENCOUNTER — Ambulatory Visit: Payer: Medicare Other | Admitting: Cardiovascular Disease

## 2022-07-06 DIAGNOSIS — Z79899 Other long term (current) drug therapy: Secondary | ICD-10-CM | POA: Diagnosis not present

## 2022-07-06 DIAGNOSIS — M5451 Vertebrogenic low back pain: Secondary | ICD-10-CM | POA: Diagnosis not present

## 2022-07-06 DIAGNOSIS — M4322 Fusion of spine, cervical region: Secondary | ICD-10-CM | POA: Diagnosis not present

## 2022-07-06 DIAGNOSIS — G894 Chronic pain syndrome: Secondary | ICD-10-CM | POA: Diagnosis not present

## 2022-07-06 DIAGNOSIS — Z79891 Long term (current) use of opiate analgesic: Secondary | ICD-10-CM | POA: Diagnosis not present

## 2022-07-06 DIAGNOSIS — M961 Postlaminectomy syndrome, not elsewhere classified: Secondary | ICD-10-CM | POA: Diagnosis not present

## 2022-07-06 DIAGNOSIS — M542 Cervicalgia: Secondary | ICD-10-CM | POA: Diagnosis not present

## 2022-07-06 DIAGNOSIS — M25569 Pain in unspecified knee: Secondary | ICD-10-CM | POA: Diagnosis not present

## 2022-07-08 ENCOUNTER — Encounter: Payer: Self-pay | Admitting: Family Medicine

## 2022-07-08 ENCOUNTER — Ambulatory Visit (INDEPENDENT_AMBULATORY_CARE_PROVIDER_SITE_OTHER): Payer: Medicare Other | Admitting: Family Medicine

## 2022-07-08 VITALS — BP 133/70 | HR 58 | Temp 98.2°F | Ht 72.0 in | Wt 224.8 lb

## 2022-07-08 DIAGNOSIS — E039 Hypothyroidism, unspecified: Secondary | ICD-10-CM

## 2022-07-08 DIAGNOSIS — E1100 Type 2 diabetes mellitus with hyperosmolarity without nonketotic hyperglycemic-hyperosmolar coma (NKHHC): Secondary | ICD-10-CM | POA: Diagnosis not present

## 2022-07-08 DIAGNOSIS — I5042 Chronic combined systolic (congestive) and diastolic (congestive) heart failure: Secondary | ICD-10-CM | POA: Diagnosis not present

## 2022-07-08 DIAGNOSIS — I152 Hypertension secondary to endocrine disorders: Secondary | ICD-10-CM | POA: Diagnosis not present

## 2022-07-08 DIAGNOSIS — E1169 Type 2 diabetes mellitus with other specified complication: Secondary | ICD-10-CM

## 2022-07-08 DIAGNOSIS — E1159 Type 2 diabetes mellitus with other circulatory complications: Secondary | ICD-10-CM

## 2022-07-08 DIAGNOSIS — E785 Hyperlipidemia, unspecified: Secondary | ICD-10-CM

## 2022-07-08 LAB — POCT GLYCOSYLATED HEMOGLOBIN (HGB A1C): Hemoglobin A1C: 6.7 % — AB (ref 4.0–5.6)

## 2022-07-08 NOTE — Progress Notes (Signed)
   Thomas [name] is a 67 y.o. male who presents today for an office visit.  Assessment/Plan:  Chronic Problems Addressed Today: DM2 (diabetes mellitus, type 2) (HCC) A1c well-controlled 6.7.  He is on metformin 1000 mg twice daily and Amaryl 4 mg daily.  Tolerating well.  Home sugars are at goal.  Continue current regimen.  He will come back in 6 months to recheck A1c.  Hypertension associated with diabetes (HCC) Initially elevated however at goal on recheck.  Continue lisinopril 10 mg twice daily.  Hypothyroidism TSH at goal few months ago.  Continue Synthroid 150 mcg daily.    Subjective:  HPI:  See A/p for status of chronic conditions. He is here today for diabetes follow-up.  Has upcoming TAVR.  Overall doing well.  No acute concerns today.       Objective:  Physical Exam: BP 133/70   Pulse (!) 58   Temp 98.2 F (36.8 C) (Temporal)   Ht 6' (1.829 m)   Wt 224 lb 12.8 oz (102 kg)   SpO2 97%   BMI 30.49 kg/m   Gen: No acute distress, resting comfortably CV: Regular rate and rhythm with 3/6 systolic murmur appreciated Pulm: Normal work of breathing, clear to auscultation bilaterally with no crackles, wheezes, or rhonchi Neuro: Grossly normal, moves all extremities Psych: Normal affect and thought content      Kees Idrovo M. Jimmey Ralph, MD 07/08/2022 8:20 AM

## 2022-07-08 NOTE — Assessment & Plan Note (Signed)
TSH at goal few months ago.  Continue Synthroid 150 mcg daily.

## 2022-07-08 NOTE — Assessment & Plan Note (Signed)
Initially elevated however at goal on recheck.  Continue lisinopril 10 mg twice daily.

## 2022-07-08 NOTE — Patient Instructions (Signed)
It was very nice to see you today!  Your A1c looks great.  We will continue your current medications.  Please keep an eye on your blood pressure and blood sugar at home.   We will see you back in 6 months. Come back sooner if needed.   Take care, Dr Jimmey Ralph  PLEASE NOTE:  If you had any lab tests, please let us know if you have not heard back within a few days. You may see your results on mychart before we have a chance to review them but we will give you a call once they are reviewed by [place].   If we ordered any referrals today, please let us know if you have not heard from their office within the next week.   If you had any urgent prescriptions sent in today, please check with the pharmacy within an hour of our visit to make sure the prescription was transmitted appropriately.   Please try these tips to maintain a healthy lifestyle:  Eat at least 3 REAL meals and 1-2 snacks per day.  Aim for no more than 5 hours between eating.  If you eat breakfast, please do so within one hour of getting up.   Each meal should contain half fruits/vegetables, one quarter protein, and one quarter carbs (no bigger than a computer mouse)  Cut down on sweet beverages. This includes juice, soda, and sweet tea.   Drink at least 1 glass of water with each meal and aim for at least 8 glasses per day  Exercise at least 150 minutes every week.

## 2022-07-08 NOTE — Assessment & Plan Note (Signed)
A1c well-controlled 6.7.  He is on metformin 1000 mg twice daily and Amaryl 4 mg daily.  Tolerating well.  Home sugars are at goal.  Continue current regimen.  He will come back in 6 months to recheck A1c.

## 2022-07-09 ENCOUNTER — Encounter (HOSPITAL_COMMUNITY)
Admission: RE | Admit: 2022-07-09 | Discharge: 2022-07-09 | Disposition: A | Discharge status: Home / Self Care | Payer: Medicare Other | Source: Ambulatory Visit | Attending: Cardiovascular Disease | Admitting: Cardiovascular Disease

## 2022-07-09 ENCOUNTER — Ambulatory Visit (HOSPITAL_COMMUNITY)
Admission: RE | Admit: 2022-07-09 | Discharge: 2022-07-09 | Disposition: A | Discharge status: Home / Self Care | Payer: Medicare Other | Source: Ambulatory Visit | Attending: Cardiovascular Disease | Admitting: Cardiovascular Disease

## 2022-07-09 ENCOUNTER — Other Ambulatory Visit: Payer: Self-pay

## 2022-07-09 VITALS — BP 127/72 | HR 55 | Temp 98.2°F | Resp 18 | Ht 72.0 in | Wt 226.7 lb

## 2022-07-09 DIAGNOSIS — Z01818 Encounter for other preprocedural examination: Secondary | ICD-10-CM

## 2022-07-09 DIAGNOSIS — Z1152 Encounter for screening for COVID-19: Secondary | ICD-10-CM | POA: Insufficient documentation

## 2022-07-09 DIAGNOSIS — I350 Nonrheumatic aortic (valve) stenosis: Secondary | ICD-10-CM | POA: Diagnosis not present

## 2022-07-09 LAB — URINALYSIS, ROUTINE W REFLEX MICROSCOPIC
Bilirubin Urine: NEGATIVE
Glucose, UA: NEGATIVE mg/dL
Hgb urine dipstick: NEGATIVE
Ketones, ur: NEGATIVE mg/dL
Leukocytes,Ua: NEGATIVE
Nitrite: NEGATIVE
Protein, ur: NEGATIVE mg/dL
Specific Gravity, Urine: 1.016 (ref 1.005–1.030)
pH: 5 (ref 5.0–8.0)

## 2022-07-09 LAB — COMPREHENSIVE METABOLIC PANEL
ALT: 27 U/L (ref 0–44)
AST: 32 U/L (ref 15–41)
Albumin: 3.7 g/dL (ref 3.5–5.0)
Alkaline Phosphatase: 47 U/L (ref 38–126)
Anion gap: 9 (ref 5–15)
BUN: 22 mg/dL (ref 8–23)
CO2: 26 mmol/L (ref 22–32)
Calcium: 8.5 mg/dL — ABNORMAL LOW (ref 8.9–10.3)
Chloride: 105 mmol/L (ref 98–111)
Creatinine, Ser: 1.62 mg/dL — ABNORMAL HIGH (ref 0.61–1.24)
GFR, Estimated: 46 mL/min — ABNORMAL LOW (ref >60)
Glucose, Bld: 126 mg/dL — ABNORMAL HIGH (ref 70–99)
Potassium: 3.9 mmol/L (ref 3.5–5.1)
Sodium: 140 mmol/L (ref 135–145)
Total Bilirubin: 1.2 mg/dL (ref 0.3–1.2)
Total Protein: 6.4 g/dL — ABNORMAL LOW (ref 6.5–8.1)

## 2022-07-09 LAB — CBC
HCT: 40.7 % (ref 39.0–52.0)
Hemoglobin: 13 g/dL (ref 13.0–17.0)
MCH: 30.2 pg (ref 26.0–34.0)
MCHC: 31.9 g/dL (ref 30.0–36.0)
MCV: 94.4 fL (ref 80.0–100.0)
Platelets: 198 10*3/uL (ref 150–400)
RBC: 4.31 MIL/uL (ref 4.22–5.81)
RDW: 13.8 % (ref 11.5–15.5)
WBC: 7.1 10*3/uL (ref 4.0–10.5)
nRBC: 0 % (ref 0.0–0.2)

## 2022-07-09 LAB — TYPE AND SCREEN
ABO/RH(D): O POS
Antibody Screen: NEGATIVE

## 2022-07-09 LAB — PROTIME-INR
INR: 1.2 (ref 0.8–1.2)
Prothrombin Time: 15.4 seconds — ABNORMAL HIGH (ref 11.4–15.2)

## 2022-07-09 LAB — GLUCOSE, CAPILLARY: Glucose-Capillary: 140 mg/dL — ABNORMAL HIGH (ref 70–99)

## 2022-07-09 LAB — SURGICAL PCR SCREEN
MRSA, PCR: NEGATIVE
Staphylococcus aureus: POSITIVE — AB

## 2022-07-09 NOTE — Progress Notes (Signed)
Julieta Gutting, RN with TAVR team confirmed review of all TAVR lab results from today. No new orders given.

## 2022-07-09 NOTE — Progress Notes (Signed)
Patient signed all consents at PAT lab appointment. CHG soap and instructions were given to patient. CHG surgical prep reviewed with patient and all questions answered.  Pt denies any respiratory illness/infection in the last two months.  Med rec not complete by time of lab appointment. Pt and pts wife instructed to call the Pharmacy Call Center (phone number provided) when they got home and review medications prior to surgery.

## 2022-07-10 LAB — SARS CORONAVIRUS 2 (TAT 6-24 HRS): SARS Coronavirus 2: NEGATIVE

## 2022-07-12 MED ORDER — DEXMEDETOMIDINE HCL IN NACL 400 MCG/100ML IV SOLN
0.1000 ug/kg/h | INTRAVENOUS | Status: AC
  Administered 2022-07-13: 102.8 ug via INTRAVENOUS
  Filled 2022-07-12 (×2): qty 100

## 2022-07-12 MED ORDER — NOREPINEPHRINE 4 MG/250ML-% IV SOLN
0.0000 ug/min | INTRAVENOUS | Status: AC
  Administered 2022-07-13: 2 ug/min via INTRAVENOUS
  Filled 2022-07-12: qty 250

## 2022-07-12 MED ORDER — HEPARIN 30,000 UNITS/1000 ML (OHS) CELLSAVER SOLUTION
Status: DC
  Filled 2022-07-12 (×2): qty 1000

## 2022-07-12 MED ORDER — MAGNESIUM SULFATE 50 % IJ SOLN
40.0000 meq | INTRAMUSCULAR | Status: DC
  Filled 2022-07-12 (×2): qty 9.85

## 2022-07-12 MED ORDER — POTASSIUM CHLORIDE 2 MEQ/ML IV SOLN
80.0000 meq | INTRAVENOUS | Status: DC
  Filled 2022-07-12 (×2): qty 40

## 2022-07-12 MED ORDER — CEFAZOLIN SODIUM-DEXTROSE 2-4 GM/100ML-% IV SOLN
2.0000 g | INTRAVENOUS | Status: AC
  Administered 2022-07-13: 2 g via INTRAVENOUS
  Filled 2022-07-12: qty 100

## 2022-07-13 ENCOUNTER — Inpatient Hospital Stay (HOSPITAL_COMMUNITY)
Admission: RE | Admit: 2022-07-13 | Discharge: 2022-07-14 | DRG: 267 | Disposition: A | Discharge status: Home / Self Care | Payer: Medicare Other | Attending: Cardiovascular Disease | Admitting: Cardiovascular Disease

## 2022-07-13 ENCOUNTER — Encounter (HOSPITAL_COMMUNITY): Payer: Self-pay | Admitting: Cardiovascular Disease

## 2022-07-13 ENCOUNTER — Inpatient Hospital Stay (HOSPITAL_COMMUNITY): Payer: Medicare Other

## 2022-07-13 ENCOUNTER — Other Ambulatory Visit: Payer: Self-pay

## 2022-07-13 ENCOUNTER — Encounter (HOSPITAL_COMMUNITY)
Admission: RE | Disposition: A | Discharge status: Home / Self Care | Payer: Self-pay | Source: Home / Self Care | Attending: Cardiovascular Disease

## 2022-07-13 ENCOUNTER — Inpatient Hospital Stay (HOSPITAL_COMMUNITY): Payer: Medicare Other | Admitting: Certified Registered Nurse Anesthetist

## 2022-07-13 ENCOUNTER — Inpatient Hospital Stay (HOSPITAL_COMMUNITY): Payer: Medicare Other | Admitting: Physician Assistant

## 2022-07-13 ENCOUNTER — Other Ambulatory Visit: Payer: Self-pay | Admitting: Cardiology

## 2022-07-13 DIAGNOSIS — Z888 Allergy status to other drugs, medicaments and biological substances status: Secondary | ICD-10-CM

## 2022-07-13 DIAGNOSIS — I480 Paroxysmal atrial fibrillation: Secondary | ICD-10-CM | POA: Diagnosis present

## 2022-07-13 DIAGNOSIS — I350 Nonrheumatic aortic (valve) stenosis: Secondary | ICD-10-CM | POA: Diagnosis not present

## 2022-07-13 DIAGNOSIS — I493 Ventricular premature depolarization: Secondary | ICD-10-CM | POA: Diagnosis present

## 2022-07-13 DIAGNOSIS — E1122 Type 2 diabetes mellitus with diabetic chronic kidney disease: Secondary | ICD-10-CM | POA: Diagnosis not present

## 2022-07-13 DIAGNOSIS — Z880 Allergy status to penicillin: Secondary | ICD-10-CM | POA: Diagnosis not present

## 2022-07-13 DIAGNOSIS — I10 Essential (primary) hypertension: Secondary | ICD-10-CM

## 2022-07-13 DIAGNOSIS — Z006 Encounter for examination for normal comparison and control in clinical research program: Secondary | ICD-10-CM

## 2022-07-13 DIAGNOSIS — N1831 Chronic kidney disease, stage 3a: Secondary | ICD-10-CM | POA: Diagnosis present

## 2022-07-13 DIAGNOSIS — E7800 Pure hypercholesterolemia, unspecified: Secondary | ICD-10-CM | POA: Diagnosis not present

## 2022-07-13 DIAGNOSIS — Z952 Presence of prosthetic heart valve: Principal | ICD-10-CM

## 2022-07-13 DIAGNOSIS — R001 Bradycardia, unspecified: Secondary | ICD-10-CM | POA: Diagnosis not present

## 2022-07-13 DIAGNOSIS — I5042 Chronic combined systolic (congestive) and diastolic (congestive) heart failure: Secondary | ICD-10-CM | POA: Diagnosis not present

## 2022-07-13 DIAGNOSIS — E039 Hypothyroidism, unspecified: Secondary | ICD-10-CM | POA: Diagnosis not present

## 2022-07-13 DIAGNOSIS — E1169 Type 2 diabetes mellitus with other specified complication: Secondary | ICD-10-CM | POA: Diagnosis not present

## 2022-07-13 DIAGNOSIS — E119 Type 2 diabetes mellitus without complications: Secondary | ICD-10-CM

## 2022-07-13 DIAGNOSIS — Z79899 Other long term (current) drug therapy: Secondary | ICD-10-CM | POA: Diagnosis not present

## 2022-07-13 DIAGNOSIS — I130 Hypertensive heart and chronic kidney disease with heart failure and stage 1 through stage 4 chronic kidney disease, or unspecified chronic kidney disease: Secondary | ICD-10-CM | POA: Diagnosis present

## 2022-07-13 DIAGNOSIS — Z981 Arthrodesis status: Secondary | ICD-10-CM | POA: Diagnosis not present

## 2022-07-13 DIAGNOSIS — Z8249 Family history of ischemic heart disease and other diseases of the circulatory system: Secondary | ICD-10-CM

## 2022-07-13 DIAGNOSIS — I152 Hypertension secondary to endocrine disorders: Secondary | ICD-10-CM | POA: Diagnosis not present

## 2022-07-13 DIAGNOSIS — Z8673 Personal history of transient ischemic attack (TIA), and cerebral infarction without residual deficits: Secondary | ICD-10-CM

## 2022-07-13 DIAGNOSIS — I4892 Unspecified atrial flutter: Secondary | ICD-10-CM | POA: Diagnosis present

## 2022-07-13 DIAGNOSIS — Z7901 Long term (current) use of anticoagulants: Secondary | ICD-10-CM

## 2022-07-13 DIAGNOSIS — Z79890 Hormone replacement therapy (postmenopausal): Secondary | ICD-10-CM

## 2022-07-13 DIAGNOSIS — E1159 Type 2 diabetes mellitus with other circulatory complications: Secondary | ICD-10-CM | POA: Diagnosis present

## 2022-07-13 DIAGNOSIS — Z7984 Long term (current) use of oral hypoglycemic drugs: Secondary | ICD-10-CM | POA: Diagnosis not present

## 2022-07-13 DIAGNOSIS — Z833 Family history of diabetes mellitus: Secondary | ICD-10-CM | POA: Diagnosis not present

## 2022-07-13 DIAGNOSIS — N183 Chronic kidney disease, stage 3 unspecified: Secondary | ICD-10-CM | POA: Diagnosis present

## 2022-07-13 DIAGNOSIS — I2510 Atherosclerotic heart disease of native coronary artery without angina pectoris: Secondary | ICD-10-CM

## 2022-07-13 HISTORY — DX: Presence of prosthetic heart valve: Z95.2

## 2022-07-13 HISTORY — PX: TRANSCATHETER AORTIC VALVE REPLACEMENT, TRANSFEMORAL: SHX6400

## 2022-07-13 HISTORY — PX: INTRAOPERATIVE TRANSTHORACIC ECHOCARDIOGRAM: SHX6523

## 2022-07-13 LAB — POCT I-STAT 7, (LYTES, BLD GAS, ICA,H+H)
Acid-Base Excess: 1 mmol/L (ref 0.0–2.0)
Bicarbonate: 26.5 mmol/L (ref 20.0–28.0)
Calcium, Ion: 1.2 mmol/L (ref 1.15–1.40)
HCT: 39 % (ref 39.0–52.0)
Hemoglobin: 13.3 g/dL (ref 13.0–17.0)
O2 Saturation: 99 %
Potassium: 3.8 mmol/L (ref 3.5–5.1)
Sodium: 142 mmol/L (ref 135–145)
TCO2: 28 mmol/L (ref 22–32)
pCO2 arterial: 43 mmHg (ref 32–48)
pH, Arterial: 7.398 (ref 7.35–7.45)
pO2, Arterial: 126 mmHg — ABNORMAL HIGH (ref 83–108)

## 2022-07-13 LAB — POCT I-STAT, CHEM 8
BUN: 16 mg/dL (ref 8–23)
Calcium, Ion: 1.21 mmol/L (ref 1.15–1.40)
Chloride: 102 mmol/L (ref 98–111)
Creatinine, Ser: 1.4 mg/dL — ABNORMAL HIGH (ref 0.61–1.24)
Glucose, Bld: 154 mg/dL — ABNORMAL HIGH (ref 70–99)
HCT: 39 % (ref 39.0–52.0)
Hemoglobin: 13.3 g/dL (ref 13.0–17.0)
Potassium: 3.9 mmol/L (ref 3.5–5.1)
Sodium: 142 mmol/L (ref 135–145)
TCO2: 27 mmol/L (ref 22–32)

## 2022-07-13 LAB — GLUCOSE, CAPILLARY
Glucose-Capillary: 126 mg/dL — ABNORMAL HIGH (ref 70–99)
Glucose-Capillary: 177 mg/dL — ABNORMAL HIGH (ref 70–99)
Glucose-Capillary: 152 mg/dL — ABNORMAL HIGH (ref 70–99)
Glucose-Capillary: 141 mg/dL — ABNORMAL HIGH (ref 70–99)
Glucose-Capillary: 78 mg/dL (ref 70–99)

## 2022-07-13 LAB — ECHOCARDIOGRAM LIMITED
AR max vel: 4.38 cm2
AV Area VTI: 4.41 cm2
AV Area mean vel: 4.12 cm2
AV Mean grad: 3.7 mmHg
AV Peak grad: 7.2 mmHg
Ao pk vel: 1.34 m/s

## 2022-07-13 SURGERY — IMPLANTATION, AORTIC VALVE, TRANSCATHETER, FEMORAL APPROACH
Anesthesia: Monitor Anesthesia Care

## 2022-07-13 MED ORDER — HEPARIN SODIUM (PORCINE) 1000 UNIT/ML IJ SOLN
INTRAMUSCULAR | Status: DC | PRN
  Administered 2022-07-13: 17000 [IU] via INTRAVENOUS

## 2022-07-13 MED ORDER — HEPARIN (PORCINE) IN NACL 1000-0.9 UT/500ML-% IV SOLN
INTRAVENOUS | Status: DC | PRN
  Administered 2022-07-13 (×2): 500 mL

## 2022-07-13 MED ORDER — ONDANSETRON HCL 4 MG/2ML IJ SOLN: 4.0000 mg | Freq: Four times a day (QID) | INTRAMUSCULAR | Status: DC | PRN

## 2022-07-13 MED ORDER — MORPHINE SULFATE (PF) 2 MG/ML IV SOLN: 1.0000 mg | INTRAVENOUS | Status: DC | PRN

## 2022-07-13 MED ORDER — OXYCODONE HCL 5 MG PO TABS
5.0000 mg | ORAL_TABLET | ORAL | Status: DC | PRN
  Administered 2022-07-13 – 2022-07-14 (×2): 10 mg via ORAL
  Filled 2022-07-13 (×2): qty 2

## 2022-07-13 MED ORDER — TRAMADOL HCL 50 MG PO TABS
50.0000 mg | ORAL_TABLET | ORAL | Status: DC | PRN
  Administered 2022-07-13: 100 mg via ORAL
  Filled 2022-07-13: qty 2

## 2022-07-13 MED ORDER — ONDANSETRON HCL 4 MG/2ML IJ SOLN
INTRAMUSCULAR | Status: DC | PRN
  Administered 2022-07-13: 4 mg via INTRAVENOUS

## 2022-07-13 MED ORDER — SODIUM CHLORIDE 0.9% FLUSH
3.0000 mL | Freq: Two times a day (BID) | INTRAVENOUS | Status: DC
  Administered 2022-07-13: 3 mL via INTRAVENOUS

## 2022-07-13 MED ORDER — GABAPENTIN 400 MG PO CAPS
400.0000 mg | ORAL_CAPSULE | Freq: Three times a day (TID) | ORAL | Status: DC
  Administered 2022-07-13 – 2022-07-14 (×4): 400 mg via ORAL
  Filled 2022-07-13 (×4): qty 1

## 2022-07-13 MED ORDER — INSULIN ASPART 100 UNIT/ML IJ SOLN
0.0000 [IU] | INTRAMUSCULAR | Status: DC | PRN
  Administered 2022-07-13: 2 [IU] via SUBCUTANEOUS

## 2022-07-13 MED ORDER — NITROGLYCERIN IN D5W 200-5 MCG/ML-% IV SOLN: 0.0000 ug/min | INTRAVENOUS | Status: DC

## 2022-07-13 MED ORDER — SODIUM CHLORIDE 0.9 % IV SOLN
250.0000 mL | INTRAVENOUS | Status: DC
  Administered 2022-07-13: 250 mL via INTRAVENOUS

## 2022-07-13 MED ORDER — INSULIN ASPART 100 UNIT/ML IJ SOLN
0.0000 [IU] | Freq: Three times a day (TID) | INTRAMUSCULAR | Status: DC
  Administered 2022-07-13 (×2): 2 [IU] via SUBCUTANEOUS

## 2022-07-13 MED ORDER — ACETAMINOPHEN 325 MG PO TABS: 650.0000 mg | ORAL_TABLET | Freq: Four times a day (QID) | ORAL | Status: DC | PRN

## 2022-07-13 MED ORDER — ACETAMINOPHEN 650 MG RE SUPP: 650.0000 mg | Freq: Four times a day (QID) | RECTAL | Status: DC | PRN

## 2022-07-13 MED ORDER — LIDOCAINE HCL (PF) 1 % IJ SOLN
INTRAMUSCULAR | Status: AC
  Filled 2022-07-13: qty 30

## 2022-07-13 MED ORDER — INSULIN ASPART 100 UNIT/ML IJ SOLN
INTRAMUSCULAR | Status: AC
  Filled 2022-07-13: qty 1

## 2022-07-13 MED ORDER — LACTATED RINGERS IV SOLN: INTRAVENOUS | Status: DC | PRN

## 2022-07-13 MED ORDER — LEVOTHYROXINE SODIUM 150 MCG PO TABS
150.0000 ug | ORAL_TABLET | Freq: Every day | ORAL | Status: DC
  Administered 2022-07-14: 150 ug via ORAL
  Filled 2022-07-13: qty 1
  Filled 2022-07-13: qty 2

## 2022-07-13 MED ORDER — PROPOFOL 500 MG/50ML IV EMUL
INTRAVENOUS | Status: DC | PRN
  Administered 2022-07-13: 20 ug/kg/min via INTRAVENOUS

## 2022-07-13 MED ORDER — ROSUVASTATIN CALCIUM 5 MG PO TABS
10.0000 mg | ORAL_TABLET | Freq: Every day | ORAL | Status: DC
  Administered 2022-07-13 – 2022-07-14 (×2): 10 mg via ORAL
  Filled 2022-07-13 (×2): qty 2

## 2022-07-13 MED ORDER — VANCOMYCIN HCL IN DEXTROSE 1-5 GM/200ML-% IV SOLN
1000.0000 mg | Freq: Once | INTRAVENOUS | Status: AC
  Administered 2022-07-13: 1000 mg via INTRAVENOUS
  Filled 2022-07-13: qty 200

## 2022-07-13 MED ORDER — SODIUM CHLORIDE 0.9 % IV SOLN: INTRAVENOUS | Status: DC

## 2022-07-13 MED ORDER — SODIUM CHLORIDE 0.9% FLUSH: 3.0000 mL | INTRAVENOUS | Status: DC | PRN

## 2022-07-13 MED ORDER — LIDOCAINE HCL (PF) 1 % IJ SOLN
INTRAMUSCULAR | Status: DC | PRN
  Administered 2022-07-13: 10 mL

## 2022-07-13 MED ORDER — CHLORHEXIDINE GLUCONATE 4 % EX LIQD
60.0000 mL | Freq: Once | CUTANEOUS | Status: DC
  Filled 2022-07-13: qty 60

## 2022-07-13 MED ORDER — SODIUM CHLORIDE 0.9 % IV SOLN: 250.0000 mL | INTRAVENOUS | Status: DC | PRN

## 2022-07-13 MED ORDER — PROTAMINE SULFATE 10 MG/ML IV SOLN
INTRAVENOUS | Status: DC | PRN
  Administered 2022-07-13: 170 mg via INTRAVENOUS

## 2022-07-13 MED ORDER — CHLORHEXIDINE GLUCONATE 4 % EX LIQD
30.0000 mL | CUTANEOUS | Status: DC
  Filled 2022-07-13: qty 30

## 2022-07-13 MED ORDER — AMIODARONE HCL 200 MG PO TABS: 200.0000 mg | ORAL_TABLET | Freq: Every day | ORAL | Status: DC

## 2022-07-13 MED ORDER — APIXABAN 5 MG PO TABS
5.0000 mg | ORAL_TABLET | Freq: Two times a day (BID) | ORAL | Status: DC
  Administered 2022-07-13 – 2022-07-14 (×2): 5 mg via ORAL
  Filled 2022-07-13 (×2): qty 1

## 2022-07-13 MED ORDER — IOHEXOL 350 MG/ML SOLN
INTRAVENOUS | Status: DC | PRN
  Administered 2022-07-13: 60 mL

## 2022-07-13 MED ORDER — SODIUM CHLORIDE 0.9 % IV SOLN: INTRAVENOUS | Status: AC

## 2022-07-13 MED ORDER — CHLORHEXIDINE GLUCONATE 0.12 % MT SOLN
15.0000 mL | Freq: Once | OROMUCOSAL | Status: AC
  Administered 2022-07-13: 15 mL via OROMUCOSAL
  Filled 2022-07-13 (×2): qty 15

## 2022-07-13 SURGICAL SUPPLY — 29 items
BAG SNAP BAND KOVER 36X36 (MISCELLANEOUS) ×2 IMPLANT
CABLE ADAPT PACING TEMP 12FT (ADAPTER) IMPLANT
CATH 26 ULTRA DELIVERY (CATHETERS) IMPLANT
CATH DIAG 6FR PIGTAIL ANGLED (CATHETERS) IMPLANT
CATH INFINITI 6F AL2 (CATHETERS) IMPLANT
CATH S G BIP PACING (CATHETERS) IMPLANT
CLOSURE MYNX CONTROL 6F/7F (Vascular Products) IMPLANT
CLOSURE PERCLOSE PROSTYLE (VASCULAR PRODUCTS) IMPLANT
CRIMPER (MISCELLANEOUS) IMPLANT
DEVICE INFLATION ATRION QL2530 (MISCELLANEOUS) IMPLANT
KIT HEART LEFT (KITS) ×1 IMPLANT
KIT MICROPUNCTURE NIT STIFF (SHEATH) IMPLANT
KIT SAPIAN 3 ULTRA RESILIA 26 (Valve) IMPLANT
PACK CARDIAC CATHETERIZATION (CUSTOM PROCEDURE TRAY) ×1 IMPLANT
SHEATH BRITE TIP 7FR 35CM (SHEATH) IMPLANT
SHEATH INTRODUCER SET 20-26 (SHEATH) IMPLANT
SHEATH PINNACLE 6F 10CM (SHEATH) IMPLANT
SHEATH PINNACLE 8F 10CM (SHEATH) IMPLANT
SHEATH PROBE COVER 6X72 (BAG) IMPLANT
SLEEVE REPOSITIONING LENGTH 30 (MISCELLANEOUS) IMPLANT
STOPCOCK MORSE 400PSI 3WAY (MISCELLANEOUS) ×2 IMPLANT
SYR MEDRAD MARK 7 150ML (SYRINGE) IMPLANT
TRANSDUCER W/STOPCOCK (MISCELLANEOUS) ×2 IMPLANT
TUBING CONTRAST HIGH PRESS 48 (TUBING) IMPLANT
WIRE AMPLATZ SS-J .035X180CM (WIRE) IMPLANT
WIRE EMERALD 3MM-J .035X150CM (WIRE) IMPLANT
WIRE EMERALD 3MM-J .035X260CM (WIRE) IMPLANT
WIRE EMERALD ST .035X260CM (WIRE) IMPLANT
WIRE SAFARI SM CURVE 275 (WIRE) IMPLANT

## 2022-07-13 NOTE — Anesthesia Preprocedure Evaluation (Addendum)
Anesthesia Evaluation  Patient identified by MRN, date of birth, ID band Patient awake    Reviewed: Allergy & Precautions, NPO status , Patient's Chart, lab work & pertinent test results  History of Anesthesia Complications Negative for: history of anesthetic complications  Airway Mallampati: II  TM Distance: >3 FB Neck ROM: Full    Dental  (+) Edentulous Upper, Edentulous Lower   Pulmonary neg pulmonary ROS   breath sounds clear to auscultation       Cardiovascular hypertension, Pt. on medications + CAD  + Valvular Problems/Murmurs  Rhythm:Regular + Systolic murmurs 1. Left ventricular ejection fraction, by estimation, is 35 to 40%. The  left ventricle has moderately decreased function. The left ventricle  demonstrates global hypokinesis. The left ventricular internal cavity size  was mildly dilated.   2. Right ventricular systolic function is normal. The right ventricular  size is normal.   3. Left atrial size was mild to moderately dilated. No left atrial/left  atrial appendage thrombus was detected. The LAA emptying velocity was 79  cm/s.   4. Right atrial size was mildly dilated.   5. The mitral valve is normal in structure. Mild mitral valve  regurgitation. No evidence of mitral stenosis.   6. The aortic valve is tricuspid. There is severe calcifcation of the  aortic valve. There is moderate thickening of the aortic valve. Aortic  valve regurgitation is moderate. Low flow low gradient severe AS. Aortic  valve mean gradient measures 19.0 mmHg.   7. Aortic dilatation noted. There is mild dilatation of the ascending  aorta, measuring 40 mm.   8. Cardioverted first attempt at 120 J (unsuccessful), then 150 J second  attempt (successful).     Neuro/Psych Left face droop CVA, Residual Symptoms  negative psych ROS   GI/Hepatic negative GI ROS, Neg liver ROS,,,  Endo/Other  diabetesHypothyroidism    Renal/GU Renal  InsufficiencyRenal diseaseLab Results      Component                Value               Date                      CREATININE               1.62 (H)            07/09/2022                Musculoskeletal   Abdominal   Peds  Hematology Lab Results      Component                Value               Date                      WBC                      7.1                 07/09/2022                HGB                      13.0                07/09/2022  HCT                      40.7                07/09/2022                MCV                      94.4                07/09/2022                PLT                      198                 07/09/2022              Anesthesia Other Findings   Reproductive/Obstetrics                              Anesthesia Physical Anesthesia Plan  ASA: 4  Anesthesia Plan: MAC   Post-op Pain Management: Minimal or no pain anticipated   Induction: Intravenous  PONV Risk Score and Plan: 1 and Propofol infusion and Ondansetron  Airway Management Planned: Nasal Cannula, Natural Airway and Simple Face Mask  Additional Equipment: Arterial line  Intra-op Plan:   Post-operative Plan:   Informed Consent: I have reviewed the patients History and Physical, chart, labs and discussed the procedure including the risks, benefits and alternatives for the proposed anesthesia with the patient or authorized representative who has indicated his/her understanding and acceptance.     Dental advisory given  Plan Discussed with: CRNA  Anesthesia Plan Comments:          Anesthesia Quick Evaluation

## 2022-07-13 NOTE — Op Note (Addendum)
OPERATIVE NOTE: Patient Name: Thomas [name] Date of Birth: [birth year] Date of Operation: 07/13/22  PRE-OPERATIVE DIAGNOSIS: Severe, Symptomatic Aortic Stenosis   POST-OPERATIVE DIAGNOSIS: Same   OPERATION: Transcatheter Aortic Valve Replacement - Transfemoral Approach  Edwards Sapien 3 THV (size 26 mm, serial # [id number] )   SURGEON: Waverly Ferrari Tasneem Cormier MD   Co-Surgeon: Kathleene Hazel, MD    EBL 10cc   FINDINGS: Good implant depth No significant AI   SPECIMENS: None   COMPLICATIONS: None   TUBES:  None   PROCEDURE IN DETAIL: The patient was brought in the operating room and laid in supine position.  The patient was prepped and draped in standard fashion. Arterial and venous lines were placed by anesthesia along under sedation. After a timeout was performed, right and left transfemoral access was gained with ultrasound guidance. The venous access was used to advance a temporary pacer into the right heart.    On the right femoral artery, a 6Fr sheath was placed and wire and pigtail through this into the noncoronary cusp.    We then used micropuncture techniques and ultrasound to approach the right groin and gained access and placed 2 Perclose devices. Via an 8 Fr sheath an Amplatz wire was placed and then exchanged for a 14 Fr E-sheath. The patient was heparinized systemically and ACT verified > 250 seconds.   Then an AL caltheter was used with flexible straight tip to get into the LV. No BAV performed. Then exchanged over a pigtail for a pre-curled wire.   An Edwards Sapien 3 THV (size 26 mm) was prepared and crimped per manufacturer's guidelines, and the proper orientation of the valve is confirmed on the [employer] delivery system. The valve was advanced through the introducer sheath using normal technique until in an appropriate position in the abdominal aorta beyond the sheath tip. The balloon was then retracted and using the fine-tuning wheel was centered on  the valve. The valve was then advanced across the aortic arch using appropriate flexion of the catheter. The valve was carefully positioned across the aortic valve annulus. The Commander catheter was retracted using normal technique. Once final position of the valve has been confirmed by angiographic assessment, the valve is deployed while temporarily holding ventilation and during rapid ventricular pacing to maintain systolic blood pressure < 50 mmHg and pulse pressure < 10 mmHg. The balloon inflation is held for >3 seconds after reaching full deployment volume. Once the balloon has fully deflated the balloon is retracted into the ascending aorta and valve function is assessed using TTE. There is felt to be no paravalvular leak and no central aortic insufficiency.  The patient's hemodynamic recovery following valve deployment is good.  The deployment balloon and guidewire are both removed. Echo demostrated acceptable post-procedural gradients, stable mitral valve function, and no AI.  Implant depth was excellent. Hemodynamics were good. We then removed the sheath and deployed perclose x 2. Protamine was administered.    Sheaths were removed and closure devices had been completed. I defer to Dr. Clifton James regarding the Mynx closure of artery.    The patient had a stable status and was transferred to the postoperative care unit in stable condition. All surgical counts were correct.

## 2022-07-13 NOTE — Anesthesia Procedure Notes (Signed)
Procedure Name: MAC Date/Time: 07/13/2022 7:44 AM  Performed by: Garfield Cornea, CRNAPre-anesthesia Checklist: Patient identified, Emergency Drugs available, Suction available and Patient being monitored Patient Re-evaluated:Patient Re-evaluated prior to induction Oxygen Delivery Method: Simple face mask Dental Injury: Teeth and Oropharynx as per pre-operative assessment

## 2022-07-13 NOTE — Progress Notes (Signed)
Pt arrived to unit from  cath lab for TAVR, Pt HR base line brady MD aware , A/O x 4,  CCMD called ,CHG given, pt oriented to unit,Will continue to monitor.   Karna Christmas Felipe Paluch, RN    07/13/22 1101  Vitals  Temp 97.6 F (36.4 C)  Temp Source Oral  BP 102/60  MAP (mmHg) 73  BP Location Left Arm  BP Method Automatic  Patient Position (if appropriate) Lying  Pulse Rate (!) 40  Pulse Rate Source Monitor  ECG Heart Rate (!) 40  Resp 13  Level of Consciousness  Level of Consciousness Alert  Oxygen Therapy  SpO2 95 %  O2 Device Nasal Cannula  O2 Flow Rate (L/min) 1 L/min  ECG Monitoring  Cardiac Rhythm SB  Telemetry Box Number [id number]  Tele Box Verification Completed by Second Verifier Completed  Pain Assessment  Pain Scale 0-10  Pain Score 0  PCA/Epidural/Spinal Assessment  Respiratory Pattern Regular;Unlabored  Glasgow Coma Scale  Eye Opening 4  Best Verbal Response (NON-intubated) 5  Best Motor Response 6  Glasgow Coma Scale Score 15  MEWS Score  MEWS Temp 0  MEWS Systolic 0  MEWS Pulse 1  MEWS RR 1  MEWS LOC 0  MEWS Score 2  MEWS Score Color Yellow

## 2022-07-13 NOTE — Discharge Summary (Incomplete)
[facility]   MULTIDISCIPLINARY HEART VALVE TEAM  Discharge Summary    Patient ID: Thomas [name] MRN: [id number]; DOB: Dec 20, 1955  Admit date: 07/13/2022 Discharge date: 07/14/2022  Primary Care Provider: Ardith Dark, MD  Primary Cardiologist: Kristeen Miss, MD / Dr. Clifton James, MD and Dr. Delia Chimes, MD (TAVR)  Discharge Diagnoses    Principal Problem:   S/P TAVR (transcatheter aortic valve replacement) Active Problems:   DM2 (diabetes mellitus, type 2)   Hypertension associated with diabetes   Hyperlipidemia associated with type 2 diabetes mellitus   CKD stage 3 secondary to diabetes   Atrial flutter   Chronic combined systolic and diastolic heart failure   Severe aortic stenosis   Hypomagnesemia  Allergies Allergies  Allergen Reactions   Trulicity [Dulaglutide] Hives   Januvia [Sitagliptin] Other (See Comments)    Red eyes   Other     Stomach medicine, not sure of name. Had a mini stroke   Penicillins Swelling    Arm swelling Has patient had a PCN reaction causing immediate rash, facial/tongue/throat swelling, SOB or lightheadedness with hypotension: No Has patient had a PCN reaction causing severe rash involving mucus membranes or skin necrosis: No Has patient had a PCN reaction that required hospitalization No Has patient had a PCN reaction occurring within the last 10 years: No If all of the above answers are "NO", then may proceed with Cephalosporin use.   Diagnostic Studies/Procedures    [facility]  TAVR OPERATIVE NOTE     Date of Procedure:                07/13/2022   Preoperative Diagnosis:      Severe Aortic Stenosis    Postoperative Diagnosis:    Same    Procedure:        Transcatheter Aortic Valve Replacement - Transfemoral Approach             Edwards Sapien 3 THV (size 26 mm, model # [id number], serial # [id number] )              Co-Surgeons:                        Verne Carrow, MD and Clare Charon, MD     Anesthesiologist:                  Maple Hudson   Echocardiographer:              Izora Ribas   Pre-operative Echo Findings: Severe aortic stenosis Normal left ventricular systolic function   Post-operative Echo Findings: Trivial paravalvular leak Normal left ventricular systolic function ____________   Echo 07/14/22: Completed but pending formal read at the time of discharge   History of Present Illness     Thomas [name] is a 67 y.o. male with a history of DMT2, HTN, HLD, CKD stage IIIa, prior CVA, cervical myelopathy with cord compression s/p spinal surgery (8/23) and severe aortic stenosis who presented to [facility] on 07/13/22 for planned TAVR.   Thomas [name] is followed by Dr. Elease Hashimoto for his cardiology care. He was referred to the structural heart team initially in Nov 2023 after finding severe aortic stenosis on pre-operative echocardiogram prior to cervical spine surgery. Echo at that time showed an LVEF at 60-65%, mild LVH, trivial MR, and moderately severe to severe aortic valve stenosis with mean gradient of 39 mmHg, peak gradient 68.6 mmHg, AVA 0.82 cm2, DI 0.29, SVI 32. Given  cord compression, his cervical surgery was felt to be urgent and he is now s/p cervical spine decompression 10/28/21 with anterior cervical discectomies. He was seen post operatively by Dr. Elease Hashimoto on 04/17/22 with symptoms of LE edema, chest pain and dyspnea. EKG showed atrial flutter with RVR. He was admitted to [facility] and treated with amiodarone and started on Eliquis. He underwent a TEE guided cardioversion at that time. Repeat echo 04/26/22 showed reduced LVEF at 35% with global hypokinesis, mild MR, severe low flow/low gradient AS with mean gradient 24 mmHg, peak gradient 39 mmHg, AVA 0.81 cm2, SVI 25, DI 0.33. Cardiac cath 04/27/22 with minimal non-obstructive CAD. Cardiac CTA 04/28/22 with aortic valve calcium score of 5671. Valve area 476 mm2 suitable for a 26 mm Edwards Sapien 3 Ultra valve or 29 mm Medtrontic Evolut valve.    He was seen in our office 05/12/22 by Thomas Gold, PA-C and was in rate controlled atrial flutter and underwent planned cardioversion 05/31/22.   He was then evaluated by the multidisciplinary valve team and felt to have severe, symptomatic aortic stenosis and to be a suitable candidate for TAVR, which was set up for 07/13/22.     Hospital Course   Severe AS: s/p successful TAVR with a 26 mm Edwards Sapien 3 THV via the TF approach on 07/13/22. Post operative echo pending. Groin sites are stable. ECG with sinus bradycardia with known LAFB and no high grade heart block. He was restarted on Eliqius yesterday evening with no issues. Post procedure instructions reviewed with all questions answered. He has ambulated with CRI with no issues. Dental SBE discussed and will be provided at follow up next week. Given PCN allergy, will need to use Azithromycin.   DMT2: Covered with SSI while inpatient and restarted on home regimen at discharge. Follow with PCP   HTN: Stable with no changes   HLD: Continue Crestor  CKD stage IIIa: Baseline Cr appears to be in the 1.5-1.6 range. Stable today post procedure at 1.63. Plan to recheck at [facility] next week.   Prior CVA: No new neuro changes.   Cervical myelopathy with cord compression s/p spinal surgery (8/23): Plan to ambulate with CRI prior to discharge. If he feels well, would be a great candidate for CRII    PAF s/p DCCV: Currenlty maintaining NSR/SB post TAVR. Restarted on Eliquis. Amiodarone held yesterday evening and restarted prior to discharge.   PVC/PACs: Amiodarone held post TAVR. Restart today and follow closely with EKG next week.   Hypomagnesemia: Mag at 1.6 this AM. Replaced with 2G IV Mg given PVC on telemetry. Follow with BM[facility] next week.   Incidental findings: Circumferential wall thickening of the distal right colon, possibly due to decompression, although neoplasm could have a similar appearance. Recommend further evaluation with  colonoscopy.  Consultants: None    The patient has been seen and examined by Dr. Clifton James who feels that he is stable and ready for discharge today, 07/14/22.  _____________  Discharge Vitals Blood pressure 127/65, pulse (!) 58, temperature 98.4 F (36.9 C), temperature source Oral, resp. rate 17, height 6' (1.829 m), weight 97.5 kg, SpO2 90 %.  Filed Weights   07/13/22 0555 07/14/22 0720  Weight: 97.5 kg 97.5 kg   General: Well developed, well nourished, NAD Lungs:Clear to ausculation bilaterally. No wheezes, rales, or rhonchi. Breathing is unlabored. Cardiovascular: RRR with S1 S2. No murmurs Abdomen: Soft, non-tender, non-distended. No obvious abdominal masses. Extremities: No edema. Bilateral groin sites stable with no evidence of hematoma  or bleeding.  Neuro: Alert and oriented. No focal deficits. No facial asymmetry. MAE spontaneously. Psych: Responds to questions appropriately with normal affect.    Labs & Radiologic Studies    CBC Recent Labs    07/13/22 0800 07/14/22 0124  WBC  --  8.4  HGB 13.3 11.8*  HCT 39.0 36.8*  MCV  --  93.6  PLT  --  189   Basic Metabolic Panel Recent Labs    16/10/96 0756 07/13/22 0800 07/14/22 0124  NA 142 142 137  K 3.9 3.8 3.7  CL 102  --  104  CO2  --   --  28  GLUCOSE 154*  --  97  BUN 16  --  17  CREATININE 1.40*  --  1.63*  CALCIUM  --   --  8.4*  MG  --   --  1.6*   Liver Function Tests No results for input(s): "AST", "ALT", "ALKPHOS", "BILITOT", "PROT", "ALBUMIN" in the last 72 hours. No results for input(s): "LIPASE", "AMYLASE" in the last 72 hours. Cardiac Enzymes No results for input(s): "CKTOTAL", "CKMB", "CKMBINDEX", "TROPONINI" in the last 72 hours. BNP Invalid input(s): "POCBNP" D-Dimer No results for input(s): "DDIMER" in the last 72 hours. Hemoglobin A1C No results for input(s): "HGBA1C" in the last 72 hours. Fasting Lipid Panel No results for input(s): "CHOL", "HDL", "LDLCALC", "TRIG", "CHOLHDL",  "LDLDIRECT" in the last 72 hours. Thyroid Function Tests No results for input(s): "TSH", "T4TOTAL", "T3FREE", "THYROIDAB" in the last 72 hours.  Invalid input(s): "FREET3" _____________  ECHOCARDIOGRAM LIMITED  Result Date: 07/13/2022    ECHOCARDIOGRAM LIMITED REPORT   Patient Name:   Thomas [name] Date of Exam: 07/13/2022 Medical Rec #:  [id number]       Height:       72.0 in Accession #:    [id number]      Weight:       215.0 lb Date of Birth:  12/13/1955       BSA:          2.197 m Patient Age:    67 years        BP:           127/106 mmHg Patient Gender: M               HR:           47 bpm. Exam Location:  Inpatient Procedure: Limited Echo, Limited Color Doppler and Cardiac Doppler Indications:     Aortic Stenosis  History:         Patient has prior history of Echocardiogram examinations, most                  recent 04/28/2022. Stroke, Aortic Valve Disease; Risk                  Factors:Hypertension and Diabetes.  Sonographer:     Eulah Pont RDCS Referring Phys:  [id number] Kathleene Hazel Diagnosing Phys: Riley Lam MD  Sonographer Comments: 26mm Edwards Sapien aortic valve placed. IMPRESSIONS  1. Prior to procedure, Severe low flow low gradient aortic stenosis. Mild Aortic regurgitation. Mean gradient of 35 mm Hg, Peak gradient of 60 mm Hg with DVI 0.22 in the setting of decreased LV stroke volume.  2. After procedure there is 26 mm Sapien valve. Trivial PVL at the 1 o'clock position at the PSAX view. Mean gradient 4 mm Hg, Peak gradient 8 mm Hg, EOA 4.02 cm2.  3. Left ventricular ejection fraction,  by estimation, is 35 to 40%. The left ventricle has moderately decreased function.  4. Right ventricular systolic function is normal. The right ventricular size is normal.  5. The mitral valve is grossly normal. Mild mitral valve regurgitation. Conclusion(s)/Recommendation(s): Successful TAVR placement with trivial PVL. FINDINGS  Left Ventricle: Left ventricular ejection fraction, by  estimation, is 35 to 40%. The left ventricle has moderately decreased function. The left ventricular internal cavity size was normal in size. Right Ventricle: The right ventricular size is normal. Right ventricular systolic function is normal. Mitral Valve: The mitral valve is grossly normal. Mild mitral valve regurgitation. Tricuspid Valve: The tricuspid valve is normal in structure. Tricuspid valve regurgitation is not demonstrated. No evidence of tricuspid stenosis. Aortic Valve: Aortic valve regurgitation is mild. Aortic valve mean gradient measures 3.7 mmHg. Aortic valve peak gradient measures 7.2 mmHg. Aortic valve area, by VTI measures 4.41 cm. Additional Comments: Spectral Doppler performed. Color Doppler performed.  LEFT VENTRICLE PLAX 2D LVOT diam:     2.40 cm LV SV:         124 LV SV Index:   56 LVOT Area:     4.52 cm  AORTIC VALVE AV Area (Vmax):    4.38 cm AV Area (Vmean):   4.12 cm AV Area (VTI):     4.41 cm AV Vmax:           134.33 cm/s AV Vmean:          93.400 cm/s AV VTI:            0.281 m AV Peak Grad:      7.2 mmHg AV Mean Grad:      3.7 mmHg LVOT Vmax:         130.00 cm/s LVOT Vmean:        85.000 cm/s LVOT VTI:          0.274 m LVOT/AV VTI ratio: 0.97  SHUNTS Systemic VTI:  0.27 m Systemic Diam: 2.40 cm Riley Lam MD Electronically signed by Riley Lam MD Signature Date/Time: 07/13/2022/11:17:39 AM    Final    Structural Heart Procedure  Result Date: 07/13/2022 See surgical note for result.  DG Chest 2 View  Result Date: 07/11/2022 CLINICAL DATA:  [id number] Pre-op exam [id number] EXAM: CHEST - 2 VIEW COMPARISON:  04/26/2022. FINDINGS: The heart size and mediastinal contours are within normal limits. Both lungs are clear. No pneumothorax or pleural effusion. There are thoracic degenerative changes. IMPRESSION: No active cardiopulmonary disease. Electronically Signed   By: Layla Maw M.D.   On: 07/11/2022 21:22   Disposition   Pt is being discharged home today  in good condition.  Follow-up Plans & Appointments    Follow-up Information     Filbert Schilder, NP Follow up on 07/19/2022.   Specialty: Cardiology Why: @ 8:45am. Please arrive at 8:30am. Contact information: [address] [place] [address] [phone]                Discharge Instructions     Amb Referral to Cardiac Rehabilitation   Complete by: As directed    Diagnosis: Valve Replacement   Valve: Aortic   After initial evaluation and assessments completed: Virtual Based Care may be provided alone or in conjunction with Phase 2 Cardiac Rehab based on patient barriers.: Yes   Intensive Cardiac Rehabilitation (ICR) [facility] location only OR Traditional Cardiac Rehabilitation (TCR) *If criteria for ICR are not met will enroll in TCR [facility] only): Yes   Call  MD for:  difficulty breathing, headache or visual disturbances   Complete by: As directed    Call MD for:  extreme fatigue   Complete by: As directed    Call MD for:  hives   Complete by: As directed    Call MD for:  persistant dizziness or light-headedness   Complete by: As directed    Call MD for:  persistant nausea and vomiting   Complete by: As directed    Call MD for:  redness, tenderness, or signs of infection (pain, swelling, redness, odor or green/yellow discharge around incision site)   Complete by: As directed    Call MD for:  severe uncontrolled pain   Complete by: As directed    Call MD for:  temperature >100.4   Complete by: As directed    Diet - low sodium heart healthy   Complete by: As directed    Discharge instructions   Complete by: As directed    ACTIVITY AND EXERCISE  Daily activity and exercise are an important part of your recovery. People recover at different rates depending on their general health and type of valve procedure.  Most people recovering from TAVR feel better relatively quickly   No lifting, pushing, pulling more than 10 pounds (examples to avoid: groceries,  vacuuming, gardening, golfing):             - For one week with a procedure through the groin.             - For six weeks for procedures through the chest wall or neck. NOTE: You will typically see one of our providers 7-14 days after your procedure to discuss WHEN TO RESUME the above activities.      DRIVING  Do not drive until you are seen for follow up and cleared by a provider. Generally, we ask patient to not drive for 1 week after their procedure.  If you have been told by your doctor in the past that you may not drive, you must talk with him/her before you begin driving again.   DRESSING  Groin site: you may leave the clear dressing over the site for up to one week or until it falls off.   HYGIENE  If you had a femoral (leg) procedure, you may take a shower when you return home. After the shower, pat the site dry. Do NOT use powder, oils or lotions in your groin area until the site has completely healed.  If you had a chest procedure, you may shower when you return home unless specifically instructed not to by your discharging practitioner.             - DO NOT scrub incision; pat dry with a towel.             - DO NOT apply any lotions, oils, powders to the incision.             - No tub baths / swimming for at least 2 weeks.  If you notice any fevers, chills, increased pain, swelling, bleeding or pus, please contact your doctor.   ADDITIONAL INFORMATION  If you are going to have an upcoming dental procedure, please contact our office as you will require antibiotics ahead of time to prevent infection on your heart valve.    If you have any questions or concerns you can call the structural heart phone during normal business hours 8am-4pm. If you have an urgent need after hours or weekends please call  [phone] to talk to the on call provider for general cardiology. If you have an emergency that requires immediate attention, please call [phone].    After TAVR  Checklist  Check  Test Description  Follow up appointment in 1-2 weeks  You will see our structural heart advanced practice provider. Your incision sites will be checked and you will be cleared to drive and resume all normal activities if you are doing well.    1 month echo and follow up  You will have an echo to check on your new heart valve and be seen back in the office by a structural heart advanced practice provider.  Follow up with your primary cardiologist You will need to be seen by your primary cardiologist in the following 3-6 months after your 1 month appointment in the valve clinic.   1 year echo and follow up You will have another echo to check on your heart valve after 1 year and be seen back in the office by a structural heart advanced practice provider. This your last structural heart visit.  Bacterial endocarditis prophylaxis  You will have to take antibiotics for the rest of your life before all dental procedures (even teeth cleanings) to protect your heart valve. Antibiotics are also required before some surgeries. Please check with your cardiologist before scheduling any surgeries. Also, please make sure to tell us if you have a penicillin allergy as you will require an alternative antibiotic.   Increase activity slowly   Complete by: As directed       Discharge Medications   Allergies as of 07/14/2022       Reactions   Trulicity [dulaglutide] Hives   Januvia [sitagliptin] Other (See Comments)   Red eyes   Other    Stomach medicine, not sure of name. Had a mini stroke   Penicillins Swelling   Arm swelling Has patient had a PCN reaction causing immediate rash, facial/tongue/throat swelling, SOB or lightheadedness with hypotension: No Has patient had a PCN reaction causing severe rash involving mucus membranes or skin necrosis: No Has patient had a PCN reaction that required hospitalization No Has patient had a PCN reaction occurring within the last 10 years: No If all  of the above answers are "NO", then may proceed with Cephalosporin use.        Medication List     TAKE these medications    amiodarone 200 MG tablet Commonly known as: PACERONE Take 1 tablet by mouth twice a day for 7 days then reduce to 1 tablet by mouth daily What changed:  how much to take how to take this when to take this additional instructions   apixaban 5 MG Tabs tablet Commonly known as: ELIQUIS Take 1 tablet (5 mg total) by mouth 2 (two) times daily.   Apple Cider Vinegar 500 MG Tabs Take 500 mg by mouth at bedtime.   diclofenac sodium 1 % Gel Commonly known as: VOLTAREN Apply 1 Application topically 3 (three) times daily as needed (pain).   furosemide 40 MG tablet Commonly known as: LASIX Take 1 tablet (40 mg total) by mouth daily as needed. What changed: when to take this   gabapentin 400 MG capsule Commonly known as: NEURONTIN Take 400 mg by mouth 3 (three) times daily.   GINGER PO Take 500 mg by mouth daily.   glimepiride 4 MG tablet Commonly known as: AMARYL Take 1 tablet (4 mg total) by mouth daily with breakfast.   levothyroxine 150 MCG tablet Commonly known  as: SYNTHROID TAKE ONE TABLET ONCE DAILY BEFORE BREAKFAST   metFORMIN 1000 MG tablet Commonly known as: GLUCOPHAGE TAKE 1 TABLET (1,000 MG TOTAL) BY MOUTH TWICE A DAY WITH FOOD.  May restart on 05/01/22. What changed: additional instructions   morphine 15 MG tablet Commonly known as: MSIR Take 15 mg by mouth every 8 (eight) hours as needed for moderate pain.   OneTouch Delica Plus Lancet33G Misc CHECK BLOOD SUGAR TWICE DAILY   OneTouch Verio test strip Generic drug: glucose blood CHECK BLOOD SUGAR UP TO THREE TIMES DAILY AS DIRECTED   QC Tumeric Complex 500 MG Caps Generic drug: Turmeric Take 500 mg by mouth at bedtime.   rosuvastatin 10 MG tablet Commonly known as: CRESTOR Take 1 tablet (10 mg total) by mouth daily. What changed: when to take this         Outstanding  Labs/Studies   BMET  Duration of Discharge Encounter   Greater than 30 minutes including physician time.  SignedGeorgie Chard, NP 07/14/2022, 11:20 AM [phone]   I have personally seen and examined this patient. I agree with the assessment and plan as outlined above.  Doing well post TAVR. Groins stable. BP stable. Sinus brady. Echo with normally functioning AVR.  D/C home today.   Verne Carrow, MD, [facility] 07/14/2022 12:28 PM

## 2022-07-13 NOTE — Progress Notes (Signed)
Echocardiogram 2D Echocardiogram has been performed.  Thomas [name] 07/13/2022, 9:27 AM

## 2022-07-13 NOTE — Progress Notes (Signed)
?  [facility]   ?MULTIDISCIPLINARY HEART VALVE TEAM ? ?Patient doing well s/p TAVR. He is hemodynamically stable. Groin sites stable. ECG with no high grade block. Arterial line discontinued and transferred to 4E.  Plan for early ambulation after bedrest completed and hopeful discharge over the next 24-48 hours.  ? ?Racer Quam NP-C ?Structural Heart Team  ?Pager: [phone] ?Phone: [phone] ? ?

## 2022-07-13 NOTE — Anesthesia Procedure Notes (Signed)
Arterial Line Insertion Start/End4/16/2024 7:10 AM Performed by: Garfield Cornea, CRNA, CRNA  Patient location: Pre-op. Preanesthetic checklist: patient identified, IV checked, site marked, risks and benefits discussed, surgical consent, monitors and equipment checked, pre-op evaluation, timeout performed and anesthesia consent Lidocaine 1% used for infiltration Right, radial was placed Catheter size: 20 G Hand hygiene performed  and maximum sterile barriers used   Attempts: 1 Procedure performed without using ultrasound guided technique. Following insertion, dressing applied and Biopatch. Post procedure assessment: normal and unchanged  Patient tolerated the procedure well with no immediate complications.

## 2022-07-13 NOTE — Anesthesia Postprocedure Evaluation (Signed)
Anesthesia Post Note  Patient: Thomas [name]  Procedure(s) Performed: Transcatheter Aortic Valve Replacement, Transfemoral INTRAOPERATIVE TRANSTHORACIC ECHOCARDIOGRAM     Patient location during evaluation: PACU Anesthesia Type: MAC Level of consciousness: awake and alert Pain management: pain level controlled Vital Signs Assessment: post-procedure vital signs reviewed and stable Respiratory status: spontaneous breathing, nonlabored ventilation and respiratory function stable Cardiovascular status: stable Postop Assessment: no apparent nausea or vomiting Anesthetic complications: no   There were no known notable events for this encounter.  Last Vitals:  Vitals:   07/13/22 1200 07/13/22 1230  BP: (!) 110/59   Pulse: (!) 38 (!) 38  Resp: 10 12  Temp:  (!) 36.4 C  SpO2: 100% 98%    Last Pain:  Vitals:   07/13/22 1230  TempSrc: Oral  PainSc:                  [name]

## 2022-07-13 NOTE — CV Procedure (Signed)
[facility]  TAVR OPERATIVE NOTE   Date of Procedure:  07/13/2022  Preoperative Diagnosis: Severe Aortic Stenosis   Postoperative Diagnosis: Same   Procedure:   Transcatheter Aortic Valve Replacement - Transfemoral Approach  Edwards Sapien 3 THV (size 26 mm, model # [id number], serial # [id number] )   Co-Surgeons:  Verne Carrow, MD and Clare Charon, MD   Anesthesiologist:  Maple Hudson  Echocardiographer:  Izora Ribas  Pre-operative Echo Findings: Severe aortic stenosis Normal left ventricular systolic function  Post-operative Echo Findings: Trivial paravalvular leak Normal left ventricular systolic function  BRIEF CLINICAL NOTE AND INDICATIONS FOR SURGERY  67 yo male with history of diabetes, HTN, hyperlipidemia, prior CVA, cervical myelopathy with cord compression and severe aortic stenosis who is here today for TAVR. Echo 04/26/22 with LVEF around 35% with global hypokinesis. Mild MR. Severe low flow/low gradient AS with mean gradient 24 mmHg, peak gradient 39 mmHg, AVA 0.81 cm2, SVI 25, DI 0.33. Cardiac cath 04/27/22 with minimal non-obstructive CAD. Cardiac CTA 04/28/22 with aortic valve calcium score of 5671. Valve area 476 mm2   During the course of the patient's preoperative work up they have been evaluated comprehensively by a multidisciplinary team of specialists coordinated through the [facility] in the [facility].  They have been demonstrated to suffer from symptomatic severe aortic stenosis as noted above. The patient has been counseled extensively as to the relative risks and benefits of all options for the treatment of severe aortic stenosis including long term medical therapy, conventional surgery for aortic valve replacement, and transcatheter aortic valve replacement.  The patient has been independently evaluated by Dr. Delia Chimes with CT surgery and they are felt to be at high risk for conventional  surgical aortic valve replacement. The surgeon indicated the patient would be a poor candidate for conventional surgery. Based upon review of all of the patient's preoperative diagnostic tests they are felt to be candidate for transcatheter aortic valve replacement using the transfemoral approach as an alternative to high risk conventional surgery.    Following the decision to proceed with transcatheter aortic valve replacement, a discussion has been held regarding what types of management strategies would be attempted intraoperatively in the event of life-threatening complications, including whether or not the patient would be considered a candidate for the use of cardiopulmonary bypass and/or conversion to open sternotomy for attempted surgical intervention.  The patient has been advised of a variety of complications that might develop peculiar to this approach including but not limited to risks of death, stroke, paravalvular leak, aortic dissection or other major vascular complications, aortic annulus rupture, device embolization, cardiac rupture or perforation, acute myocardial infarction, arrhythmia, heart block or bradycardia requiring permanent pacemaker placement, congestive heart failure, respiratory failure, renal failure, pneumonia, infection, other late complications related to structural valve deterioration or migration, or other complications that might ultimately cause a temporary or permanent loss of functional independence or other long term morbidity.  The patient provides full informed consent for the procedure as described and all questions were answered preoperatively.    DETAILS OF THE OPERATIVE PROCEDURE  PREPARATION:   The patient is brought to the operating room on the above mentioned date and central monitoring was established by the anesthesia team including placement of a radial arterial line. The patient is placed in the supine position on the operating table.  Intravenous  antibiotics are administered. Conscious sedation is used.   Baseline transthoracic echocardiogram was performed. The patient's chest, abdomen,  both groins, and both lower extremities are prepared and draped in a sterile manner. A time out procedure is performed.   PERIPHERAL ACCESS:   Using the modified Seldinger technique, femoral arterial and venous access were obtained with placement of a 6 Fr sheath in the artery and a 7 Fr sheath in the vein on the right side using u/s guidance.  A pigtail diagnostic catheter was passed through the femoral arterial sheath under fluoroscopic guidance into the aortic root.  A temporary transvenous pacemaker catheter was passed through the femoral venous sheath under fluoroscopic guidance into the right ventricle.  The pacemaker was tested to ensure stable lead placement and pacemaker capture. Aortic root angiography was performed in order to determine the optimal angiographic angle for valve deployment.  TRANSFEMORAL ACCESS:  A micropuncture kit was used to gain access to the left femoral artery using u/s guidance. Position confirmed with angiography. Pre-closure with double ProGlide closure devices. The patient was heparinized systemically and ACT verified > 250 seconds.    A 14 Fr transfemoral E-sheath was introduced into the left femoral artery after progressively dilating over an Amplatz superstiff wire. An AL-2 catheter was used to direct a straight-tip exchange length wire across the native aortic valve into the left ventricle. This was exchanged out for a pigtail catheter and position was confirmed in the LV apex. Simultaneous LV and Ao pressures were recorded.  The pigtail catheter was then exchanged for a Safari wire in the LV apex.   TRANSCATHETER HEART VALVE DEPLOYMENT:  An Edwards Sapien 3 THV (size 26 mm) was prepared and crimped per manufacturer's guidelines, and the proper orientation of the valve is confirmed on the [employer] delivery system.  The valve was advanced through the introducer sheath using normal technique until in an appropriate position in the abdominal aorta beyond the sheath tip. The balloon was then retracted and using the fine-tuning wheel was centered on the valve. The valve was then advanced across the aortic arch using appropriate flexion of the catheter. The valve was carefully positioned across the aortic valve annulus. The Commander catheter was retracted using normal technique. Once final position of the valve has been confirmed by angiographic assessment, the valve is deployed while temporarily holding ventilation and during rapid ventricular pacing to maintain systolic blood pressure < 50 mmHg and pulse pressure < 10 mmHg. The balloon inflation is held for >3 seconds after reaching full deployment volume. Once the balloon has fully deflated the balloon is retracted into the ascending aorta and valve function is assessed using TTE. There is felt to be trivial paravalvular leak and no central aortic insufficiency.  The patient's hemodynamic recovery following valve deployment is good.  The deployment balloon and guidewire are both removed. Echo demostrated acceptable post-procedural gradients, stable mitral valve function, and trivial AI.   PROCEDURE COMPLETION:  The sheath was then removed and closure devices were completed. Protamine was administered once femoral arterial repair was complete. The temporary pacemaker, pigtail catheters and femoral sheaths were removed with a Mynx closure device placed in the artery and manual pressure used for venous hemostasis.    The patient tolerated the procedure well and is transported to the surgical intensive care in stable condition. There were no immediate intraoperative complications. All sponge instrument and needle counts are verified correct at completion of the operation.   No blood products were administered during the operation.  The patient received a total of 60 mL of  intravenous contrast during the procedure.  LVEDP was  not measured.   Verne Carrow MD, [facility] 07/13/2022 9:35 AM

## 2022-07-13 NOTE — H&P (Signed)
No sig updates to history, plan for TAVR today.        [address]       [place] [address]             [id number]                                                   Thomas [name] [facility] Medical Record #[id number] Date of Birth: 29-Jan-1956   Referring: Ardith Dark, MD Primary Care: Ardith Dark, MD Primary Cardiologist: Kristeen Miss, MD   Chief Complaint:   No chief complaint on file.     History of Present Illness:    Thomas [name] 67 y.o. male  who presents for evaluation of aortic stenosis. Mean gradient of 39mm Hg. He also has had aflutter and a couple of heart failure hospitalizations and cardioversion.  I confirmed escalating dyspnea symptoms related to valvular heart disease.    Walks with a cane. Lives at home w wife.    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 Mouth fell open and couldn't shut it.    Cannot lay flat for hours due to pain.    Recent cardiology HPI : 67 yo male with history of diabetes, HTN, hyperlipidemia, prior CVA, cervical myelopathy with cord compression and moderate to severe aortic stenosis who is here today for cardiac follow up. I saw him as a new consult in November 2023 to discuss his aortic stenosis. He was planning to have cervical spine surgery in July 2023 and a murmur was noted on exam. Echo with LVEF=60-65%. Mild LVH. Trivial MR. Moderately severe to severe aortic valve stenosis with mean gradient of 39 mmHg, peak gradient 68.6 mmHg, AVA 0.82 cm2, DI 0.29, SVI 32. He had cord compression and his cervical surgery was felt to be urgent. He underwent cervical spine surgery on 10/28/21 with anterior cervical discectomies and did well with this. He was seen by Dr. Elease Hashimoto on 04/17/22 with LE edema, chest pain and dyspnea. EKG with atrial flutter with RVR. He was admitted to [facility], loaded with amiodarone, started on Eliquis. He underwent a TEE guided cardioversion. Echo 04/26/22 with LVEF around 35% with global  hypokinesis. Mild MR. Severe low flow/low gradient AS with mean gradient 24 mmHg, peak gradient 39 mmHg, AVA 0.81 cm2, SVI 25, DI 0.33. Cardiac cath 04/27/22 with minimal non-obstructive CAD. Cardiac CTA 04/28/22 with aortic valve calcium score of 5671. Valve area 476 mm2 suitable for a 26 mm Edwards Sapien 3 Ultra valve or 29 mm Medtrontic Evolut valve. He was seen in our office 05/12/22 by Perlie Gold, PA-C and was in rated controlled atrial flutter. Cardioversion planned for 05/31/22.    He is here today for follow up. The patient denies any chest pain, palpitations, lower extremity edema, orthopnea, PND, dizziness, near syncope or syncope. He is feeling well overall but having some dyspnea with exertion.    He lives in [place], [place] with his wife. His daughter works in our office as a Clinical biochemist. He is retired from Design[employer], fashion/clothing. He has full dentures.        Past Medical History:  Diagnosis Date   Cervical myelopathy (HCC)     Diabetes mellitus without complication (HCC)     Hypercholesterolemia     Hypertension     Severe  aortic stenosis     Stroke [facility]) 1982    pt states he had a mini stroke in 1982           Past Surgical History:  Procedure Laterality Date   ANTERIOR CERVICAL DECOMP/DISCECTOMY FUSION N/A 10/28/2021    Procedure: ACDF - C3-C4 - C4-C5 - C5-C6;  Surgeon: [name], MD;  Location: [facility] OR;  Service: Neurosurgery;  Laterality: N/A;   BACK SURGERY       CARDIOVERSION N/A 04/28/2022    Procedure: CARDIOVERSION;  Surgeon: Jodelle Red, MD;  Location: [facility] ENDOSCOPY;  Service: Cardiovascular;  Laterality: N/A;   HERNIA REPAIR       RIGHT/LEFT HEART CATH AND CORONARY ANGIOGRAPHY N/A 04/27/2022    Procedure: RIGHT/LEFT HEART CATH AND CORONARY ANGIOGRAPHY;  Surgeon: Lennette Bihari, MD;  Location: [facility] INVASIVE CV LAB;  Service: Cardiovascular;  Laterality: N/A;   TEE WITHOUT CARDIOVERSION N/A 04/28/2022    Procedure: TRANSESOPHAGEAL ECHOCARDIOGRAM (TEE);  Surgeon: Jodelle Red, MD;  Location: [facility] ENDOSCOPY;  Service: Cardiovascular;  Laterality: N/A;           Family History  Problem Relation Age of Onset   Diabetes Mother     Heart attack Mother     Hypertension Mother     Arthritis Mother     Skin cancer Father     Arthritis Father     Breast cancer Sister          Social History       Tobacco Use  Smoking Status Never  Smokeless Tobacco Never    Social History        Substance and Sexual Activity  Alcohol Use Never    Comment: occ beer              Allergies  Allergen Reactions   Trulicity [Dulaglutide] Hives   Januvia [Sitagliptin] Other (See Comments)      Red eyes   Other        Stomach medicine, not sure of name. Had a mini stroke   Penicillins Swelling      Arm swelling Has patient had a PCN reaction causing immediate rash, facial/tongue/throat swelling, SOB or lightheadedness with hypotension: No Has patient had a PCN reaction causing severe rash involving mucus membranes or skin necrosis: No Has patient had a PCN reaction that required hospitalization No Has patient had a PCN reaction occurring within the last 10 years: No If all of the above answers are "NO", then may proceed with Cephalosporin use.            Current Outpatient Medications  Medication Sig Dispense Refill   amiodarone (PACERONE) 200 MG tablet Take 1 tablet by mouth twice a day for 7 days then reduce to 1 tablet by mouth daily 102 tablet 3   apixaban (ELIQUIS) 5 MG TABS tablet Take 1 tablet (5 mg total) by mouth 2 (two) times daily. 180 tablet 3   Apple Cider Vinegar 500 MG TABS Take 500 mg by mouth at bedtime.       diclofenac sodium (VOLTAREN) 1 % GEL Apply 1 Application topically 4 (four) times daily as needed (pain).   4   FLUZONE HIGH-DOSE QUADRIVALENT 0.7 ML SUSY Inject 0.5 mLs into the muscle once.       furosemide (LASIX) 40 MG tablet Take 1 tablet (40 mg total) by mouth daily as needed. 30 tablet 3   gabapentin (NEURONTIN) 400 MG capsule  Take 400 mg by mouth 3 (three) times  daily.       Ginger, Zingiber officinalis, (GINGER PO) Take 1 tablet by mouth daily.       glimepiride (AMARYL) 4 MG tablet Take 1 tablet (4 mg total) by mouth daily with breakfast. 90 tablet 3   Lancets (ONETOUCH DELICA PLUS LANCET33G) MISC CHECK BLOOD SUGAR TWICE DAILY 200 each 1   levothyroxine (SYNTHROID) 150 MCG tablet TAKE ONE TABLET ONCE DAILY BEFORE BREAKFAST 90 tablet 0   metFORMIN (GLUCOPHAGE) 1000 MG tablet TAKE 1 TABLET (1,000 MG TOTAL) BY MOUTH TWICE A DAY WITH FOOD.  May restart on 05/01/22. 180 tablet 0   morphine (MSIR) 15 MG tablet Take 15 mg by mouth every 8 (eight) hours as needed for moderate pain.       ONETOUCH VERIO test strip CHECK BLOOD SUGAR UP TO THREE TIMES DAILY AS DIRECTED 100 strip 12   rosuvastatin (CRESTOR) 10 MG tablet Take 1 tablet (10 mg total) by mouth daily. 90 tablet 3   Turmeric (QC TUMERIC COMPLEX) 500 MG CAPS Take 500 mg by mouth at bedtime.        No current facility-administered medications for this visit.      ROS 14 point ROS reviewed and negative except as per HPI     PHYSICAL EXAMINATION: There were no vitals taken for this visit.   Gen: NAD Neuro: Alert and oriented CV: + systolic murmur Resp: Nonlaboured Abd: Soft, ntnd Extr: WWP   Diagnostic Studies & Laboratory data:     Recent Radiology Findings:    Imaging Results  CT Angio Abd/Pel w/ and/or w/o   Result Date: 04/28/2022 CLINICAL DATA:  Preop evaluation for aortic valve replacement EXAM: CT ANGIOGRAPHY CHEST, ABDOMEN AND PELVIS TECHNIQUE: Multidetector CT imaging through the chest, abdomen and pelvis was performed using the standard protocol during bolus administration of intravenous contrast. Multiplanar reconstructed images and MIPs were obtained and reviewed to evaluate the vascular anatomy. RADIATION DOSE REDUCTION: This exam was performed according to the departmental dose-optimization program which includes automated exposure control,  adjustment of the mA and/or kV according to patient size and/or use of iterative reconstruction technique. CONTRAST:  OMNIPAQUE IOHEXOL 350 MG/ML SOLN COMPARISON:  None Available. FINDINGS: CTA CHEST FINDINGS Cardiovascular: Normal heart size. No pericardial effusion. Normal caliber thoracic aorta with mild atherosclerotic disease. Standard three-vessel aortic arch with no significant stenosis. Aortic valve thickening and calcifications. Left main and 3 vessel coronary artery disease. Mediastinum/Nodes: Mildly patulous esophagus. Thyroid is unremarkable. Mildly enlarged mediastinal and right hilar lymph nodes, likely reactive. Reference subcarinal lymph node measuring 1.2 cm in short axis on series 4, image 97. Reference right perihilar lymph node measuring 1.0 cm in short axis on series 4, image 94 Lungs/Pleura: Central airways are patent. Mild smooth septal thickening and diffuse ground-glass opacities. Right basilar atelectasis. No pleural effusion or pneumothorax. Musculoskeletal: Orthopedic hardware of the cervical spine. No chest wall abnormality. No acute or significant osseous findings. CTA ABDOMEN AND PELVIS FINDINGS Hepatobiliary: No focal liver abnormality is seen. No gallstones, gallbladder wall thickening, or biliary dilatation. Pancreas: Unremarkable. No pancreatic ductal dilatation or surrounding inflammatory changes. Spleen: Normal in size without focal abnormality. Adrenals/Urinary Tract: Bilateral adrenal glands are unremarkable. No hydronephrosis nephrolithiasis. Bilateral renal scarring. Simple appearing cyst of the mid region of the right kidney, no specific follow-up imaging is recommended. Bladder is unremarkable. Stomach/Bowel: Stomach is within normal limits. Appendix appears normal. Circumferential wall thickening of the distal right colon seen on series 4, image 194. large rectal diverticulum versus evolving  area of fat necrosis seen on series 4 image 231. No evidence of obstruction.  Vascular/lymphatic: Normal caliber abdominal aorta with mild atherosclerotic disease and no significant stenosis. No pathologically enlarged lymph nodes seen in the abdomen or pelvis. Reproductive: Prostate is unremarkable. Other: Fat containing structures of the left hemiabdomen on series 4, image 206, and series 4 image 178, consistent with fat necrosis. No abdominal wall hernia or abnormality. No abdominopelvic ascites. Musculoskeletal: No acute or significant osseous findings. VASCULAR MEASUREMENTS PERTINENT TO TAVR: AORTA: Minimal Aortic Diameter-14.2 mm Severity of Aortic Calcification-mild RIGHT PELVIS: Right Common Iliac Artery - Minimal Diameter-7.9 mm Tortuosity-none Calcification-mild Right External Iliac Artery - Minimal Diameter-7.8 mm Tortuosity-none Calcification-none Right Common Femoral Artery - Minimal Diameter-5.6 mm Tortuosity-none Calcification-moderate LEFT PELVIS: Left Common Iliac Artery - Minimal Diameter-7.8 mm Tortuosity-none Calcification-moderate Left External Iliac Artery - Minimal Diameter-7.4 mm Tortuosity-none Calcification-none Left Common Femoral Artery - Minimal Diameter-7.6 mm Tortuosity-none Calcification-none Review of the MIP images confirms the above findings. IMPRESSION: 1. Vascular findings and measurements pertinent to potential TAVR procedure, as detailed above. 2. Thickening and calcification of the aortic valve, compatible with reported clinical history of aortic stenosis. 3. Mild-to-moderate aortoiliac atherosclerosis. Left main and 3 vessel coronary artery disease. 4. Circumferential wall thickening of the distal right colon, possibly due to decompression, although neoplasm could have a similar appearance. Recommend further evaluation with colonoscopy. 5. Mild pulmonary edema. Electronically Signed   By: Allegra Lai M.D.   On: 04/28/2022 12:32    CT ANGIO CHEST AORTA W/CM & OR WO/CM   Result Date: 04/28/2022 CLINICAL DATA:  Preop evaluation for aortic valve  replacement EXAM: CT ANGIOGRAPHY CHEST, ABDOMEN AND PELVIS TECHNIQUE: Multidetector CT imaging through the chest, abdomen and pelvis was performed using the standard protocol during bolus administration of intravenous contrast. Multiplanar reconstructed images and MIPs were obtained and reviewed to evaluate the vascular anatomy. RADIATION DOSE REDUCTION: This exam was performed according to the departmental dose-optimization program which includes automated exposure control, adjustment of the mA and/or kV according to patient size and/or use of iterative reconstruction technique. CONTRAST:  OMNIPAQUE IOHEXOL 350 MG/ML SOLN COMPARISON:  None Available. FINDINGS: CTA CHEST FINDINGS Cardiovascular: Normal heart size. No pericardial effusion. Normal caliber thoracic aorta with mild atherosclerotic disease. Standard three-vessel aortic arch with no significant stenosis. Aortic valve thickening and calcifications. Left main and 3 vessel coronary artery disease. Mediastinum/Nodes: Mildly patulous esophagus. Thyroid is unremarkable. Mildly enlarged mediastinal and right hilar lymph nodes, likely reactive. Reference subcarinal lymph node measuring 1.2 cm in short axis on series 4, image 97. Reference right perihilar lymph node measuring 1.0 cm in short axis on series 4, image 94 Lungs/Pleura: Central airways are patent. Mild smooth septal thickening and diffuse ground-glass opacities. Right basilar atelectasis. No pleural effusion or pneumothorax. Musculoskeletal: Orthopedic hardware of the cervical spine. No chest wall abnormality. No acute or significant osseous findings. CTA ABDOMEN AND PELVIS FINDINGS Hepatobiliary: No focal liver abnormality is seen. No gallstones, gallbladder wall thickening, or biliary dilatation. Pancreas: Unremarkable. No pancreatic ductal dilatation or surrounding inflammatory changes. Spleen: Normal in size without focal abnormality. Adrenals/Urinary Tract: Bilateral adrenal glands are  unremarkable. No hydronephrosis nephrolithiasis. Bilateral renal scarring. Simple appearing cyst of the mid region of the right kidney, no specific follow-up imaging is recommended. Bladder is unremarkable. Stomach/Bowel: Stomach is within normal limits. Appendix appears normal. Circumferential wall thickening of the distal right colon seen on series 4, image 194. large rectal diverticulum versus evolving area of fat necrosis seen on series  4 image 231. No evidence of obstruction. Vascular/lymphatic: Normal caliber abdominal aorta with mild atherosclerotic disease and no significant stenosis. No pathologically enlarged lymph nodes seen in the abdomen or pelvis. Reproductive: Prostate is unremarkable. Other: Fat containing structures of the left hemiabdomen on series 4, image 206, and series 4 image 178, consistent with fat necrosis. No abdominal wall hernia or abnormality. No abdominopelvic ascites. Musculoskeletal: No acute or significant osseous findings. VASCULAR MEASUREMENTS PERTINENT TO TAVR: AORTA: Minimal Aortic Diameter-14.2 mm Severity of Aortic Calcification-mild RIGHT PELVIS: Right Common Iliac Artery - Minimal Diameter-7.9 mm Tortuosity-none Calcification-mild Right External Iliac Artery - Minimal Diameter-7.8 mm Tortuosity-none Calcification-none Right Common Femoral Artery - Minimal Diameter-5.6 mm Tortuosity-none Calcification-moderate LEFT PELVIS: Left Common Iliac Artery - Minimal Diameter-7.8 mm Tortuosity-none Calcification-moderate Left External Iliac Artery - Minimal Diameter-7.4 mm Tortuosity-none Calcification-none Left Common Femoral Artery - Minimal Diameter-7.6 mm Tortuosity-none Calcification-none Review of the MIP images confirms the above findings. IMPRESSION: 1. Vascular findings and measurements pertinent to potential TAVR procedure, as detailed above. 2. Thickening and calcification of the aortic valve, compatible with reported clinical history of aortic stenosis. 3. Mild-to-moderate  aortoiliac atherosclerosis. Left main and 3 vessel coronary artery disease. 4. Circumferential wall thickening of the distal right colon, possibly due to decompression, although neoplasm could have a similar appearance. Recommend further evaluation with colonoscopy. 5. Mild pulmonary edema. Electronically Signed   By: Allegra Lai M.D.   On: 04/28/2022 12:32    CT CORONARY MORPH W/CTA COR W/SCORE W/CA W/CM &/OR WO/CM   Addendum Date: 04/28/2022   ADDENDUM REPORT: 04/28/2022 11:47 EXAM: OVER-READ INTERPRETATION  CT CHEST The following report is an over-read performed by radiologist Dr. Jacob Moores [facility] on 04/28/2022. This over-read does not include interpretation of cardiac or coronary anatomy or pathology. The cardiac TAVR interpretation by the cardiologist is attached. COMPARISON:  None. FINDINGS: Extracardiac findings will be described separately under dictation for contemporaneously obtained CTA chest, abdomen and pelvis. IMPRESSION: Please see separate dictation for contemporaneously obtained CTA chest, abdomen and pelvis dated 04/28/2022 for full description of relevant extracardiac findings. Electronically Signed   By: Allegra Lai M.D.   On: 04/28/2022 11:47    Result Date: 04/28/2022 CLINICAL DATA:  Aortic valve replacement (TAVR), pre-op eval, severe aortic stenosis EXAM: Cardiac TAVR CT TECHNIQUE: The patient was scanned on a Siemens Force 192 slice scanner. A 120 kV retrospective scan was triggered in the descending thoracic aorta at 111 HU's. Gantry rotation speed was 270 msecs and collimation was .9 mm. The 3D data set was reconstructed in 5% intervals of the R-R cycle. Systolic and diastolic phases were analyzed on a dedicated work station using MPR, MIP and VRT modes. The patient received OMNIPAQUE IOHEXOL 350 MG/ML SOLN of contrast. FINDINGS: Image quality: Good Aortic Valve: Tricuspid aortic valve with severely reduced cusp excursion. Severely thickened  and severely calcified aortic valve cusps. AV calcium score: 5671 Virtual Basal Annulus Measurements: Maximum/Minimum Diameter: 29.1 x 22.5 mm Perimeter: 79.3 mm Area:  476 mm2 Trivial LVOT calcifications. Membranous septal length: 6.4 mm, above virtual annulus Based on these measurements, the annulus would be suitable for a 26 mm Sapien 3 valve. Alternatively, Heart Team can consider 29 mm Evolut valve. Recommend Heart Team discussion for valve selection. Sinus of Valsalva Measurements: Non-coronary:  36 mm Right - coronary:  36 mm Left - coronary:  37 mm Sinus of Valsalva Height: Left: 27.7 mm Right: 27.9 mm Aorta: Conventional 3 vessel branch pattern of aortic arch. No coarctation  of the aorta. Sinotubular Junction:  37 mm Ascending Thoracic Aorta:  39 mm Aortic Arch:  28 mm Descending Thoracic Aorta:  25 mm Coronary Artery Height above Annulus: Left main: 21.1 mm Right coronary: 21 mm Coronary Arteries: Normal coronary origin. Right dominance. The study was performed without use of NTG and insufficient for plaque evaluation. Coronary artery calcium seen in 3 vessel distribution. RCA origin at the level of the ST junction. Optimum Fluoroscopic Angle for Delivery: LAO 0 CAU 0 OTHER: Atria: Left atrial appendage: Contrast mixing with incomplete opacification. Mitral valve: Grossly normal, trivial mitral annular calcifications. Pulmonary artery: Mild dilation, 34 mm. Pulmonary veins: Normal anatomy. IMPRESSION: 1. Tricuspid aortic valve with severely reduced cusp excursion. Severely thickened and severely calcified aortic valve cusps. 2. Aortic valve calcium score: 5671 3. Annulus area: 476 mm2, suitable for 26 mm Sapien 3 valve. Trivial LVOT calcifications. Membranous septal length 6 mm. 4. Sufficient coronary artery heights from annulus. 5. Optimum fluoroscopic angle for delivery: LAO 0 CAU 0 Electronically Signed: By: Weston Brass M.D. On: 04/28/2022 11:17    CARDIAC CATHETERIZATION   Result Date:  04/27/2022   Mid RCA lesion is 20% stenosed.   Mid LM lesion is 10% stenosed.   Prox LAD lesion is 20% stenosed.   Mid Cx lesion is 20% stenosed.   There is severe aortic valve stenosis. Mild coronary calcification with nonobstructive disease with smooth 10% distal left main stenosis, 20% proximal LAD stenosis, 20% AV groove circumflex stenosis, and 20% RCA stenosis. Moderate right heart pressure elevation with moderate pulmonary hypertension with mean PA pressure 35 mmHg. Severe aortic stenosis with a mean gradient 28.2, peak to peak gradient 33 mmHg, and calculated AVA 0.9 cm. RECOMMENDATION: Patient will be treated with anticoagulation with his atrial flutter/fibrillation with probable attempted cardioversion. Ultimately will need structural heart team evaluation for aortic valve replacement.    DG Chest 2 View   Result Date: 04/26/2022 CLINICAL DATA:  Atrial fibrillation, tachycardia. EXAM: CHEST - 2 VIEW COMPARISON:  November 04, 2021 FINDINGS: The heart size and mediastinal contours are within normal limits. Both lungs are clear. Chronic elevation of right hemidiaphragm unchanged. The visualized skeletal structures are stable. IMPRESSION: No active cardiopulmonary disease. Electronically Signed   By: Sherian Rein M.D.   On: 04/26/2022 13:49    ECHOCARDIOGRAM COMPLETE   Result Date: 04/26/2022    ECHOCARDIOGRAM REPORT   Patient Name:   Thomas [name] Date of Exam: 04/26/2022 Medical Rec #:  [id number]       Height:       72.0 in Accession #:    [id number]      Weight:       214.0 lb Date of Birth:  1955-07-24       BSA:          2.193 m Patient Age:    66 years        BP:           112/81 mmHg Patient Gender: M               HR:           126 bpm. Exam Location:  [address] Procedure: 2D Echo, Cardiac Doppler and Color Doppler Indications:    I35.0 Aortic Stenosis  History:        Patient has prior history of Echocardiogram examinations, most                 recent 10/15/2021. Stroke; Risk  Factors:Hypertension, Diabetes                 and HLD.  Sonographer:    Clearence Ped RCS Referring Phys: [id number] CHRISTOPHER D MCALHANY IMPRESSIONS  1. Global hypokinesis worse in inferior wall . Left ventricular ejection fraction, by estimation, is 40 to 45%. The left ventricle has mildly decreased function. The left ventricle demonstrates global hypokinesis. The left ventricular internal cavity size was moderately dilated. There is mild left ventricular hypertrophy. Left ventricular diastolic parameters are consistent with Grade II diastolic dysfunction (pseudonormalization). Elevated left ventricular end-diastolic pressure.  2. Right ventricular systolic function is normal. The right ventricular size is normal.  3. Left atrial size was moderately dilated.  4. The mitral valve is abnormal. Mild mitral valve regurgitation. No evidence of mitral stenosis. Moderate mitral annular calcification.  5. AS remains severe Gradients low and DVI higher than TTE done 10/15/21 but EF has dropped considerably . The aortic valve is tricuspid. There is severe calcifcation of the aortic valve. There is severe thickening of the aortic valve. Aortic valve regurgitation is mild. No aortic stenosis is present.  6. Aortic dilatation noted. There is mild dilatation of the aortic root, measuring 40 mm. There is mild dilatation of the ascending aorta, measuring 39 mm.  7. The inferior vena cava is normal in size with greater than 50% respiratory variability, suggesting right atrial pressure of 3 mmHg. FINDINGS  Left Ventricle: Global hypokinesis worse in inferior wall. Left ventricular ejection fraction, by estimation, is 40 to 45%. The left ventricle has mildly decreased function. The left ventricle demonstrates global hypokinesis. The left ventricular internal cavity size was moderately dilated. There is mild left ventricular hypertrophy. Left ventricular diastolic parameters are consistent with Grade II diastolic dysfunction  (pseudonormalization). Elevated left ventricular end-diastolic pressure. Right Ventricle: The right ventricular size is normal. No increase in right ventricular wall thickness. Right ventricular systolic function is normal. Left Atrium: Left atrial size was moderately dilated. Right Atrium: Right atrial size was normal in size. Pericardium: There is no evidence of pericardial effusion. Mitral Valve: The mitral valve is abnormal. There is moderate thickening of the mitral valve leaflet(s). There is moderate calcification of the mitral valve leaflet(s). Moderate mitral annular calcification. Mild mitral valve regurgitation. No evidence of mitral valve stenosis. Tricuspid Valve: The tricuspid valve is normal in structure. Tricuspid valve regurgitation is not demonstrated. No evidence of tricuspid stenosis. Aortic Valve: AS remains severe Gradients low and DVI higher than TTE done 10/15/21 but EF has dropped considerably. The aortic valve is tricuspid. There is severe calcifcation of the aortic valve. There is severe thickening of the aortic valve. Aortic valve regurgitation is mild. Aortic regurgitation PHT measures 198 msec. No aortic stenosis is present. Aortic valve mean gradient measures 24.0 mmHg. Aortic valve peak gradient measures 39.2 mmHg. Aortic valve area, by VTI measures 0.93 cm. Pulmonic Valve: The pulmonic valve was normal in structure. Pulmonic valve regurgitation is not visualized. No evidence of pulmonic stenosis. Aorta: Aortic dilatation noted. There is mild dilatation of the aortic root, measuring 40 mm. There is mild dilatation of the ascending aorta, measuring 39 mm. Venous: The inferior vena cava is normal in size with greater than 50% respiratory variability, suggesting right atrial pressure of 3 mmHg. IAS/Shunts: No atrial level shunt detected by color flow Doppler.  LEFT VENTRICLE PLAX 2D LVIDd:         4.90 cm   Diastology LVIDs:         4.00 cm  LV e' medial:    5.77 cm/s LV PW:         1.40  cm   LV E/e' medial:  25.3 LV IVS:        1.20 cm   LV e' lateral:   9.57 cm/s LVOT diam:     1.90 cm   LV E/e' lateral: 15.3 LV SV:         55 LV SV Index:   25 LVOT Area:     2.84 cm  RIGHT VENTRICLE RV Basal diam:  2.90 cm RV S prime:     9.57 cm/s TAPSE (M-mode): 1.6 cm LEFT ATRIUM             Index        RIGHT ATRIUM           Index LA diam:        4.30 cm 1.96 cm/m   RA Area:     12.20 cm LA Vol (A2C):   45.7 ml 20.84 ml/m  RA Volume:   27.70 ml  12.63 ml/m LA Vol (A4C):   60.6 ml 27.64 ml/m LA Biplane Vol: 54.7 ml 24.95 ml/m  AORTIC VALVE AV Area (Vmax):    0.86 cm AV Area (Vmean):   0.81 cm AV Area (VTI):     0.93 cm AV Vmax:           313.00 cm/s AV Vmean:          233.000 cm/s AV VTI:            0.589 m AV Peak Grad:      39.2 mmHg AV Mean Grad:      24.0 mmHg LVOT Vmax:         95.30 cm/s LVOT Vmean:        66.333 cm/s LVOT VTI:          0.193 m LVOT/AV VTI ratio: 0.33 AI PHT:            198 msec  AORTA Ao Root diam: 4.00 cm Ao Asc diam:  3.90 cm MITRAL VALVE MV Area (PHT):              SHUNTS MV Decel Time:              Systemic VTI:  0.19 m MV E velocity: 146.00 cm/s  Systemic Diam: 1.90 cm Charlton Haws MD Electronically signed by Charlton Haws MD Signature Date/Time: 04/26/2022/10:48:02 AM    Final            I have independently reviewed the above radiology studies  and reviewed the findings with the patient.    Recent Lab Findings: Recent Labs       Lab Results  Component Value Date    WBC 7.7 04/28/2022    HGB 14.8 04/28/2022    HCT 43.2 04/28/2022    PLT 219 04/28/2022    GLUCOSE 185 (H) 05/21/2022    CHOL 91 04/27/2022    TRIG 77 04/27/2022    HDL 30 (L) 04/27/2022    LDLDIRECT 76.0 10/09/2020    LDLCALC 46 04/27/2022    ALT 24 10/06/2021    AST 21 10/06/2021    NA 139 05/21/2022    K 4.5 05/21/2022    CL 102 05/21/2022    CREATININE 1.81 (H) 05/21/2022    BUN 34 (H) 05/21/2022    CO2 23 05/21/2022    TSH 0.361 04/27/2022    HGBA1C 6.8 (H) 04/27/2022  Assessment / Plan:   Thomas [name] 67 y.o. male  who presents for evaluation of aortic stenosis.    His comorbidities include DM2, HTN< HLD, CKD stage III, prior CVA, cervical myelopathy s/p surgery last year and recent CHF admission in aflutter.    Meets criteria for AV replacement.    It doesn't seem that atrial arrythmias in general are driving his main symptoms his stroke was not related to arrythmia more to drug reaction.    Thus working surgically on rhythm not worth recovery efforts in challenging back, neck, MSK walking with a cane physiology.    Plan for TAVr. Consider an  EP evaluation and ablation 6-12 mo after that if symptomatic from flutter.  If he has recurrent flutter still after that Convergent Hybrid Ablation is a good option.     Risks/benefits/alternatives of TAVR were discussed at length (90% standard recovery, 6-9% morbidity [any organ, typically access site vascular complication needing surgery, stroke, or pacemaker] and <1% mortality. Options are TAVR, SAVR or medical treatment. Patient understands SAVR is typically higher risk and takes longer recovery than TAVR in elderly patients. As well, discussed that medical Tx yields around 50% mortality for severe, symptomatic aortic stenosis in 2 years if left untreated, and this is an option.    Valve: 26mm Sapien (Area 504 mm2) Bailout:  yes Access:  Transfemoral (min access diameter is 7.10mm) NYHA: II    Misc/procedural: None   Dental: Full dentures     The patient was counseled at length regarding treatment alternatives for management of severe symptomatic aortic stenosis. The risks and benefits of surgical intervention has been discussed in detail. Long-term prognosis with medical therapy was discussed. Alternative approaches such as conventional surgical aortic valve replacement, transcatheter aortic valve replacement, and palliative medical therapy were compared and contrasted at length. This discussion was  placed in the context of the patient's own specific clinical presentation and past medical history. All of her questions have been addressed.    I  spent 60 minutes with  the patient face to face in counseling and coordination of care.     Thomas [name] 05/23/2022 2:07 PM

## 2022-07-13 NOTE — Transfer of Care (Signed)
Immediate Anesthesia Transfer of Care Note  Patient: Thomas [name]  Procedure(s) Performed: Transcatheter Aortic Valve Replacement, Transfemoral INTRAOPERATIVE TRANSTHORACIC ECHOCARDIOGRAM  Patient Location: Cath Lab  Anesthesia Type:MAC  Level of Consciousness: drowsy  Airway & Oxygen Therapy: Patient Spontanous Breathing and Patient connected to face mask oxygen  Post-op Assessment: Report given to RN and Post -op Vital signs reviewed and stable  Post vital signs: Reviewed and stable  Last Vitals: see postop VS flowsheet Vitals Value Taken Time  BP    Temp    Pulse    Resp    SpO2      Last Pain: There were no vitals filed for this visit.       Complications: There were no known notable events for this encounter.

## 2022-07-14 ENCOUNTER — Inpatient Hospital Stay (HOSPITAL_COMMUNITY): Payer: Medicare Other

## 2022-07-14 DIAGNOSIS — Z952 Presence of prosthetic heart valve: Secondary | ICD-10-CM

## 2022-07-14 DIAGNOSIS — I130 Hypertensive heart and chronic kidney disease with heart failure and stage 1 through stage 4 chronic kidney disease, or unspecified chronic kidney disease: Secondary | ICD-10-CM | POA: Diagnosis not present

## 2022-07-14 DIAGNOSIS — I5042 Chronic combined systolic (congestive) and diastolic (congestive) heart failure: Secondary | ICD-10-CM | POA: Diagnosis not present

## 2022-07-14 DIAGNOSIS — I350 Nonrheumatic aortic (valve) stenosis: Secondary | ICD-10-CM

## 2022-07-14 DIAGNOSIS — Z006 Encounter for examination for normal comparison and control in clinical research program: Secondary | ICD-10-CM | POA: Diagnosis not present

## 2022-07-14 LAB — ECHOCARDIOGRAM COMPLETE
AR max vel: 2.92 cm2
AV Area VTI: 3.06 cm2
AV Area mean vel: 2.9 cm2
AV Mean grad: 12 mmHg
AV Peak grad: 23.3 mmHg
Ao pk vel: 2.42 m/s
Area-P 1/2: 3.1 cm2
Calc EF: 52.7 %
Height: 72 in
S' Lateral: 3.9 cm
Single Plane A2C EF: 55.2 %
Single Plane A4C EF: 51.2 %
Weight: 3440 oz

## 2022-07-14 LAB — CBC
HCT: 36.8 % — ABNORMAL LOW (ref 39.0–52.0)
Hemoglobin: 11.8 g/dL — ABNORMAL LOW (ref 13.0–17.0)
MCH: 30 pg (ref 26.0–34.0)
MCHC: 32.1 g/dL (ref 30.0–36.0)
MCV: 93.6 fL (ref 80.0–100.0)
Platelets: 189 10*3/uL (ref 150–400)
RBC: 3.93 MIL/uL — ABNORMAL LOW (ref 4.22–5.81)
RDW: 13.7 % (ref 11.5–15.5)
WBC: 8.4 10*3/uL (ref 4.0–10.5)
nRBC: 0 % (ref 0.0–0.2)

## 2022-07-14 LAB — GLUCOSE, CAPILLARY
Glucose-Capillary: 112 mg/dL — ABNORMAL HIGH (ref 70–99)
Glucose-Capillary: 123 mg/dL — ABNORMAL HIGH (ref 70–99)

## 2022-07-14 LAB — BASIC METABOLIC PANEL
Anion gap: 5 (ref 5–15)
BUN: 17 mg/dL (ref 8–23)
CO2: 28 mmol/L (ref 22–32)
Calcium: 8.4 mg/dL — ABNORMAL LOW (ref 8.9–10.3)
Chloride: 104 mmol/L (ref 98–111)
Creatinine, Ser: 1.63 mg/dL — ABNORMAL HIGH (ref 0.61–1.24)
GFR, Estimated: 46 mL/min — ABNORMAL LOW (ref >60)
Glucose, Bld: 97 mg/dL (ref 70–99)
Potassium: 3.7 mmol/L (ref 3.5–5.1)
Sodium: 137 mmol/L (ref 135–145)

## 2022-07-14 LAB — MAGNESIUM: Magnesium: 1.6 mg/dL — ABNORMAL LOW (ref 1.7–2.4)

## 2022-07-14 MED ORDER — MAGNESIUM SULFATE 2 GM/50ML IV SOLN
2.0000 g | Freq: Once | INTRAVENOUS | Status: AC
  Administered 2022-07-14: 2 g via INTRAVENOUS
  Filled 2022-07-14: qty 50

## 2022-07-14 NOTE — Discharge Instructions (Signed)

## 2022-07-14 NOTE — Progress Notes (Signed)
  Echocardiogram 2D Echocardiogram has been performed.  Janalyn Harder 07/14/2022, 8:37 AM

## 2022-07-14 NOTE — Progress Notes (Signed)
CARDIAC REHAB PHASE I   PRE:  Rate/Rhythm: 55 SB  BP:  Sitting: 125/64      SaO2: 97 RA  MODE:  Ambulation: 170 ft   POST:  Rate/Rhythm: 68 SR  BP:  Sitting: 138/70      SaO2: 98 RA  Pt ambulated in hall, using cane for balance. Tolerated well with no pain, dizziness or SOB. Returned to bed with call bell and bedside table in reach. Post TAVR education including site care, restrictions, risk factors, exercise guidelines, diabetic heart healthy diet and CRP2 reviewed with pt and wife. All questions and concerns addressed will refer to AP for CRP2. Plan for home later today.   [phone]  Woodroe Chen, RN BSN 07/14/2022 10:41 AM

## 2022-07-15 ENCOUNTER — Telehealth: Payer: Self-pay

## 2022-07-15 NOTE — Telephone Encounter (Signed)
  [facility]   Structural Heart Team  Contacted the patient regarding discharge from [facility] on 07/14/2022  The patient understands to follow up with Georgie Chard, NP on 07/19/2022. Also confirmed echo/office visit on 08/18/2022.  The patient understands discharge instructions? Yes  The patient understands medications and regimen? Yes   The patient reports groins feel fine and have no S/S of infection or bleeding.  The patient understands to call with any questions or concerns prior to scheduled visit.

## 2022-07-19 ENCOUNTER — Ambulatory Visit: Payer: Medicare Other | Attending: Cardiology | Admitting: Cardiology

## 2022-07-19 VITALS — BP 136/54 | HR 56 | Ht 72.0 in | Wt 224.8 lb

## 2022-07-19 DIAGNOSIS — I350 Nonrheumatic aortic (valve) stenosis: Secondary | ICD-10-CM

## 2022-07-19 DIAGNOSIS — E1169 Type 2 diabetes mellitus with other specified complication: Secondary | ICD-10-CM

## 2022-07-19 DIAGNOSIS — I5042 Chronic combined systolic (congestive) and diastolic (congestive) heart failure: Secondary | ICD-10-CM | POA: Diagnosis not present

## 2022-07-19 DIAGNOSIS — E1100 Type 2 diabetes mellitus with hyperosmolarity without nonketotic hyperglycemic-hyperosmolar coma (NKHHC): Secondary | ICD-10-CM | POA: Diagnosis not present

## 2022-07-19 DIAGNOSIS — K639 Disease of intestine, unspecified: Secondary | ICD-10-CM | POA: Diagnosis not present

## 2022-07-19 DIAGNOSIS — E785 Hyperlipidemia, unspecified: Secondary | ICD-10-CM

## 2022-07-19 DIAGNOSIS — Z952 Presence of prosthetic heart valve: Secondary | ICD-10-CM

## 2022-07-19 DIAGNOSIS — Z7984 Long term (current) use of oral hypoglycemic drugs: Secondary | ICD-10-CM

## 2022-07-19 DIAGNOSIS — I483 Typical atrial flutter: Secondary | ICD-10-CM

## 2022-07-19 DIAGNOSIS — E1122 Type 2 diabetes mellitus with diabetic chronic kidney disease: Secondary | ICD-10-CM

## 2022-07-19 DIAGNOSIS — I10 Essential (primary) hypertension: Secondary | ICD-10-CM | POA: Diagnosis not present

## 2022-07-19 DIAGNOSIS — N183 Chronic kidney disease, stage 3 unspecified: Secondary | ICD-10-CM | POA: Diagnosis not present

## 2022-07-19 DIAGNOSIS — I2510 Atherosclerotic heart disease of native coronary artery without angina pectoris: Secondary | ICD-10-CM

## 2022-07-19 NOTE — Patient Instructions (Signed)
Medication Instructions:  Your physician recommends that you continue on your current medications as directed. Please refer to the Current Medication list given to you today.  *If you need a refill on your cardiac medications before your next appointment, please call your pharmacy*   Lab Work: TODAY: BMET, CBC If you have labs (blood work) drawn today and your tests are completely normal, you will receive your results only by: MyChart Message (if you have MyChart) OR A paper copy in the mail If you have any lab test that is abnormal or we need to change your treatment, we will call you to review the results.   Testing/Procedures: NONE   Follow-Up: At [facility], you and your health needs are our priority.  As part of our continuing mission to provide you with exceptional heart care, we have created designated Provider Care Teams.  These Care Teams include your primary Cardiologist (physician) and Advanced Practice Providers (APPs -  Physician Assistants and Nurse Practitioners) who all work together to provide you with the care you need, when you need it.  We recommend signing up for the patient portal called "MyChart".  Sign up information is provided on this After Visit Summary.  MyChart is used to connect with patients for Virtual Visits (Telemedicine).  Patients are able to view lab/test results, encounter notes, upcoming appointments, etc.  Non-urgent messages can be sent to your provider as well.   To learn more about what you can do with MyChart, go to [email or web address].    Your next appointment:   KEEP SCHEDULE

## 2022-07-19 NOTE — Progress Notes (Signed)
[facility]   MULTIDISCIPLINARY HEART VALVE CLINIC                                     Cardiology Office Note:    Date:  07/19/2022   ID:  [name], DOB [birth year], MRN [id number]  PCP:  Ardith Dark, MD  [facility] Cardiologist:  Kristeen Miss, MD / Dr. Clifton James, MD and Dr. Delia Chimes, MD (TAVR)   [facility] Electrophysiologist:  None   Referring MD: Ardith Dark, MD   Chief Complaint  Patient presents with   Follow-up    TOC s/p TAVR   History of Present Illness:    Thomas [name] is a 67 y.o. male with a hx of DMT2, HTN, HLD, CKD stage IIIa, prior CVA, cervical myelopathy with cord compression s/p spinal surgery (8/23) and severe aortic stenosis who presented to [facility] on 07/13/22 for planned TAVR and is being seen today for TOC follow up.    Mr. Thomas [name] is followed by Dr. Elease Hashimoto for his cardiology care. He was referred to the structural heart team initially in Nov 2023 after finding severe aortic stenosis on pre-operative echocardiogram prior to cervical spine surgery. Echo at that time showed an LVEF at 60-65%, mild LVH, trivial MR, and moderately severe to severe aortic valve stenosis with mean gradient of 39 mmHg, peak gradient 68.6 mmHg, AVA 0.82 cm2, DI 0.29, SVI 32. Given cord compression, his cervical surgery was felt to be urgent and he is now s/p cervical spine decompression 10/28/21 with anterior cervical discectomies. He was seen post operatively by Dr. Elease Hashimoto on 04/17/22 with symptoms of LE edema, chest pain and dyspnea. EKG showed atrial flutter with RVR. He was admitted to [facility] and treated with amiodarone and started on Eliquis. He underwent a TEE guided cardioversion at that time. Repeat echo 04/26/22 showed reduced LVEF at 35% with global hypokinesis, mild MR, severe low flow/low gradient AS with mean gradient 24 mmHg, peak gradient 39 mmHg, AVA 0.81 cm2, SVI 25, DI 0.33. Cardiac cath 04/27/22 with minimal non-obstructive CAD. Cardiac CTA 04/28/22 with  aortic valve calcium score of 5671. Valve area 476 mm2 suitable for a 26 mm Edwards Sapien 3 Ultra valve or 29 mm Medtrontic Evolut valve.    He was seen in our office 05/12/22 by Perlie Gold, PA-C and was in rate controlled atrial flutter and underwent planned cardioversion 05/31/22.    He was then evaluated by the multidisciplinary valve team and felt to have severe, symptomatic aortic stenosis and to be a suitable candidate and is now s/p successful TAVR with a 26 mm Edwards Sapien 3 THV via the TF approach on 07/13/22. Post operative echo with mildly improved LVEF at 45-50% with a mean gradient at 12 mmHg and peak  gradient at 23 mmHG, DVI 0.56. EOAi 1.43 cm2/m2. Groin sites remain stable. ECG with sinus bradycardia and 1st degree AV block. He was restarted on Eliqius for PAF and has tolerated this well with no bleeding in stool or urine. Does not require dental SBE as he is edentulous.   Today he is here with his wife and daughter and reports that his symptoms are improved. He is walking a lot with no SOB and reports his energy is improved. He denies chest pain, palpitations, SOB, dizziness, bleeding, or syncope. He denies LE edema however on exam he has 1+ pitting edema bilaterally.  Past Medical History:  Diagnosis Date   Cervical myelopathy    Diabetes mellitus without complication    Hypercholesterolemia    Hypertension    S/P TAVR (transcatheter aortic valve replacement) 07/13/2022   26mm S3UR with Dr. Clifton James and Dr. Delia Chimes   Severe aortic stenosis    Stroke 1982   pt states he had a mini stroke in 1982    Past Surgical History:  Procedure Laterality Date   ANTERIOR CERVICAL DECOMP/DISCECTOMY FUSION N/A 10/28/2021   Procedure: ACDF - C3-C4 - C4-C5 - C5-C6;  Surgeon: [name], MD;  Location: [facility] OR;  Service: Neurosurgery;  Laterality: N/A;   BACK SURGERY     CARDIOVERSION N/A 04/28/2022   Procedure: CARDIOVERSION;  Surgeon: Jodelle Red, MD;  Location: [facility] ENDOSCOPY;   Service: Cardiovascular;  Laterality: N/A;   CARDIOVERSION N/A 05/31/2022   Procedure: CARDIOVERSION;  Surgeon: Meriam Sprague, MD;  Location: [facility] ENDOSCOPY;  Service: Cardiovascular;  Laterality: N/A;   HERNIA REPAIR     INTRAOPERATIVE TRANSTHORACIC ECHOCARDIOGRAM N/A 07/13/2022   Procedure: INTRAOPERATIVE TRANSTHORACIC ECHOCARDIOGRAM;  Surgeon: Kathleene Hazel, MD;  Location: [facility] INVASIVE CV LAB;  Service: Open Heart Surgery;  Laterality: N/A;   RIGHT/LEFT HEART CATH AND CORONARY ANGIOGRAPHY N/A 04/27/2022   Procedure: RIGHT/LEFT HEART CATH AND CORONARY ANGIOGRAPHY;  Surgeon: Lennette Bihari, MD;  Location: [facility] INVASIVE CV LAB;  Service: Cardiovascular;  Laterality: N/A;   TEE WITHOUT CARDIOVERSION N/A 04/28/2022   Procedure: TRANSESOPHAGEAL ECHOCARDIOGRAM (TEE);  Surgeon: Jodelle Red, MD;  Location: [facility] ENDOSCOPY;  Service: Cardiovascular;  Laterality: N/A;   TRANSCATHETER AORTIC VALVE REPLACEMENT, TRANSFEMORAL N/A 07/13/2022   Procedure: Transcatheter Aortic Valve Replacement, Transfemoral;  Surgeon: Kathleene Hazel, MD;  Location: [facility] INVASIVE CV LAB;  Service: Open Heart Surgery;  Laterality: N/A;    Current Medications: Current Meds  Medication Sig   amiodarone (PACERONE) 200 MG tablet Take 1 tablet by mouth twice a day for 7 days then reduce to 1 tablet by mouth daily (Patient taking differently: Take 200 mg by mouth daily.)   apixaban (ELIQUIS) 5 MG TABS tablet Take 1 tablet (5 mg total) by mouth 2 (two) times daily.   Apple Cider Vinegar 500 MG TABS Take 500 mg by mouth at bedtime.   diclofenac sodium (VOLTAREN) 1 % GEL Apply 1 Application topically 3 (three) times daily as needed (pain).   furosemide (LASIX) 40 MG tablet Take 1 tablet (40 mg total) by mouth daily as needed. (Patient taking differently: Take 40 mg by mouth 3 (three) times a week.)   gabapentin (NEURONTIN) 400 MG capsule Take 400 mg by mouth 3 (three) times daily.   Ginger, Zingiber officinalis,  (GINGER PO) Take 500 mg by mouth daily.   glimepiride (AMARYL) 4 MG tablet Take 1 tablet (4 mg total) by mouth daily with breakfast.   Lancets (ONETOUCH DELICA PLUS LANCET33G) MISC CHECK BLOOD SUGAR TWICE DAILY   levothyroxine (SYNTHROID) 150 MCG tablet TAKE ONE TABLET ONCE DAILY BEFORE BREAKFAST   metFORMIN (GLUCOPHAGE) 1000 MG tablet TAKE 1 TABLET (1,000 MG TOTAL) BY MOUTH TWICE A DAY WITH FOOD.  May restart on 05/01/22. (Patient taking differently: TAKE 1 TABLET (1,000 MG TOTAL) BY MOUTH TWICE A DAY WITH FOOD.)   morphine (MSIR) 15 MG tablet Take 15 mg by mouth every 8 (eight) hours as needed for moderate pain.   ONETOUCH VERIO test strip CHECK BLOOD SUGAR UP TO THREE TIMES DAILY AS DIRECTED   rosuvastatin (CRESTOR) 10 MG tablet Take 1 tablet (10  mg total) by mouth daily. (Patient taking differently: Take 10 mg by mouth at bedtime.)   Turmeric (QC TUMERIC COMPLEX) 500 MG CAPS Take 500 mg by mouth at bedtime.     Allergies:   Trulicity [dulaglutide], Januvia [sitagliptin], Other, and Penicillins   Social History   Socioeconomic History   Marital status: Married    Spouse name: Not on file   Number of children: 3   Years of education: Not on file   Highest education level: Not on file  Occupational History   Occupation: Retired from Designer, fashion/clothing, also drove a dump truck  Tobacco Use   Smoking status: Never   Smokeless tobacco: Never  Building services engineer Use: Never used  Substance and Sexual Activity   Alcohol use: Never    Comment: occ beer    Drug use: No   Sexual activity: Not on file  Other Topics Concern   Not on file  Social History Narrative   Not on file   Social Determinants of Health   Financial Resource Strain: Low Risk  (03/04/2022)   Overall Financial Resource Strain (CARDIA)    Difficulty of Paying Living Expenses: Not hard at all  Food Insecurity: No Food Insecurity (07/13/2022)   Hunger Vital Sign    Worried About Running Out of Food in the Last Year: Never true     Ran Out of Food in the Last Year: Never true  Transportation Needs: No Transportation Needs (07/13/2022)   PRAPARE - Administrator, Civil Service (Medical): No    Lack of Transportation (Non-Medical): No  Physical Activity: Insufficiently Active (03/04/2022)   Exercise Vital Sign    Days of Exercise per Week: 7 days    Minutes of Exercise per Session: 20 min  Stress: No Stress Concern Present (03/04/2022)   [employer] of Occupational Health - Occupational Stress Questionnaire    Feeling of Stress : Not at all  Social Connections: Moderately Isolated (03/04/2022)   Social Connection and Isolation Panel [NHANES]    Frequency of Communication with Friends and Family: Once a week    Frequency of Social Gatherings with Friends and Family: More than three times a week    Attends Religious Services: Never    Database administrator or Organizations: No    Attends Engineer, structural: Never    Marital Status: Married    Family History: The patient's family history includes Arthritis in his father and mother; Breast cancer in his sister; Diabetes in his mother; Heart attack in his mother; Hypertension in his mother; Skin cancer in his father.  ROS:   Please see the history of present illness.    All other systems reviewed and are negative.  EKGs/Labs/Other Studies Reviewed:    The following studies were reviewed today:  [facility]  TAVR OPERATIVE NOTE     Date of Procedure:                07/13/2022   Preoperative Diagnosis:      Severe Aortic Stenosis    Postoperative Diagnosis:    Same    Procedure:        Transcatheter Aortic Valve Replacement - Transfemoral Approach             Edwards Sapien 3 THV (size 26 mm, model # [id number], serial # [id number] )              Co-Surgeons:  Verne Carrow, MD and Clare Charon, MD    Anesthesiologist:                  Maple Hudson   Echocardiographer:               Izora Ribas   Pre-operative Echo Findings: Severe aortic stenosis Normal left ventricular systolic function   Post-operative Echo Findings: Trivial paravalvular leak Normal left ventricular systolic function ____________  Echo 07/14/22:   1. The aortic valve has been replaced by a 26 mm Sapien Valve. Aortic  valve regurgitation is not visualized.Mean gradient 12 mm Hg, Peak  gradient 23 mm HG, DVI 0.56. EOAi 1.43 cm2/m2.   2. Left ventricular ejection fraction, by estimation, is 45 to 50%. The  left ventricle has mildly decreased function. The left ventricle has no  regional wall motion abnormalities. There is moderate concentric left  ventricular hypertrophy. Left  ventricular diastolic parameters are consistent with Grade II diastolic  dysfunction (pseudonormalization).   3. Right ventricular systolic function is normal. The right ventricular  size is mildly enlarged.   4. The mitral valve is grossly normal. No evidence of mitral valve  regurgitation. No evidence of mitral stenosis. The mean mitral valve  gradient is 2.5 mmHg.   5. Aortic dilatation noted. There is mild dilatation of the ascending  aorta, measuring 40 mm.   6. The inferior vena cava is normal in size with greater than 50%  respiratory variability, suggesting right atrial pressure of 3 mmHg.   Comparison(s): Prior images reviewed side by side. LV appears more  vigorous. PVL no longer seen.   EKG:  EKG is ordered today.  The ekg ordered today demonstrates SB with 1 st degree AV block.   Recent Labs: 04/27/2022: TSH 0.361 07/09/2022: ALT 27 07/14/2022: BUN 17; Creatinine, Ser 1.63; Hemoglobin 11.8; Magnesium 1.6; Platelets 189; Potassium 3.7; Sodium 137   Recent Lipid Panel    Component Value Date/Time   CHOL 91 04/27/2022 0334   TRIG 77 04/27/2022 0334   HDL 30 (L) 04/27/2022 0334   CHOLHDL 3.0 04/27/2022 0334   VLDL 15 04/27/2022 0334   LDLCALC 46 04/27/2022 0334   LDLDIRECT 76.0 10/09/2020 0840    Physical Exam:    VS:  BP (!) 136/54   Pulse (!) 56   Ht 6' (1.829 m)   Wt 224 lb 12.8 oz (102 kg)   SpO2 98%   BMI 30.49 kg/m     Wt Readings from Last 3 Encounters:  07/19/22 224 lb 12.8 oz (102 kg)  07/14/22 214 lb 15.2 oz (97.5 kg)  07/09/22 226 lb 11.2 oz (102.8 kg)    General: Well developed, well nourished, NAD Skin: Warm, dry, intact  Lungs:Clear to ausculation bilaterally. No wheezes, rales, or rhonchi. Breathing is unlabored. Cardiovascular: RRR with S1 S2. No murmurs Extremities: 1+ BLE edema. Groin sites stable.  Neuro: Alert and oriented. No focal deficits. No facial asymmetry. MAE spontaneously. Psych: Responds to questions appropriately with normal affect.    ASSESSMENT/PLAN:    Severe AS: Patient doing well with NYHA class I symptoms s/p successful TAVR with a 26 mm Edwards Sapien 3 THV via the TF approach on 07/13/22. Post operative echo with mildly improved LVEF at 45-50% with a mean gradient at 12 mmHg and peak gradient at 23 mmHG, DVI 0.56. EOAi 1.43 cm2/m2. Groin sites remain stable. ECG with sinus bradycardia and 1st degree AV block. He was restarted on Eliqius for PAF and has tolerated this well with  no bleeding in stool or urine. Does not require dental SBE as he is edentulous. Has some LE edema noted on PE today with recommendations to take PRN Lasix at least once to twice weekly or until edema has improved. Plan one month follow up with echocardiogram.     HTN: Stable with no changes    HLD: Continue Crestor   CKD stage IIIa: Baseline Cr appears to be in the 1.5-1.6 range. Stable today post procedure at 1.63. Obtain BMET today    Prior CVA: No new neuro changes.    Cervical myelopathy with cord compression s/p spinal surgery (8/23): Deferred CRII.    PAF s/p DCCV: Currenlty maintaining SB on EKG today. Continue Eliquis and Amiodarone. No BB given baseline bradycardia.   PVC/PACs: Restarted post TAVR. EKG today with NSR/SB with 1st degree AV block.      DMT2: Covered with SSI while inpatient and restarted on home regimen at discharge. Follow with PCP   Incidental findings: Circumferential wall thickening of the distal right colon, possibly due to decompression, although neoplasm could have a similar appearance. Recommend further evaluation with colonoscopy. Discussed with patient and family. He has never had a colonoscopy. High encouraged to undergo colonoscopy given the above and no prior testing. Will send note to PCP. Encouraged to move follow up from 12/2022 closer. Denies bleeding in stool and no recent change in bowel habits.    Medication Adjustments/Labs and Tests Ordered: Current medicines are reviewed at length with the patient today.  Concerns regarding medicines are outlined above.  Orders Placed This Encounter  Procedures   Basic metabolic panel   CBC   EKG 12-Lead   No orders of the defined types were placed in this encounter.   Patient Instructions  Medication Instructions:  Your physician recommends that you continue on your current medications as directed. Please refer to the Current Medication list given to you today.  *If you need a refill on your cardiac medications before your next appointment, please call your pharmacy*   Lab Work: TODAY: BMET, CBC If you have labs (blood work) drawn today and your tests are completely normal, you will receive your results only by: MyChart Message (if you have MyChart) OR A paper copy in the mail If you have any lab test that is abnormal or we need to change your treatment, we will call you to review the results.   Testing/Procedures: NONE   Follow-Up: At [facility], you and your health needs are our priority.  As part of our continuing mission to provide you with exceptional heart care, we have created designated Provider Care Teams.  These Care Teams include your primary Cardiologist (physician) and Advanced Practice Providers (APPs -  Physician Assistants  and Nurse Practitioners) who all work together to provide you with the care you need, when you need it.  We recommend signing up for the patient portal called "MyChart".  Sign up information is provided on this After Visit Summary.  MyChart is used to connect with patients for Virtual Visits (Telemedicine).  Patients are able to view lab/test results, encounter notes, upcoming appointments, etc.  Non-urgent messages can be sent to your provider as well.   To learn more about what you can do with MyChart, go to [email or web address].    Your next appointment:   KEEP SCHEDULE    Signed, Georgie Chard, NP  07/19/2022 8:55 AM    [facility]

## 2022-07-20 LAB — BASIC METABOLIC PANEL
BUN/Creatinine Ratio: 15 (ref 10–24)
BUN: 21 mg/dL (ref 8–27)
CO2: 26 mmol/L (ref 20–29)
Calcium: 9.4 mg/dL (ref 8.6–10.2)
Chloride: 101 mmol/L (ref 96–106)
Creatinine, Ser: 1.42 mg/dL — ABNORMAL HIGH (ref 0.76–1.27)
Glucose: 139 mg/dL — ABNORMAL HIGH (ref 70–99)
Potassium: 4.3 mmol/L (ref 3.5–5.2)
Sodium: 142 mmol/L (ref 134–144)
eGFR: 54 mL/min/{1.73_m2} — ABNORMAL LOW (ref >59)

## 2022-07-20 LAB — CBC
Hematocrit: 41.4 % (ref 37.5–51.0)
Hemoglobin: 13.3 g/dL (ref 13.0–17.7)
MCH: 29.6 pg (ref 26.6–33.0)
MCHC: 32.1 g/dL (ref 31.5–35.7)
MCV: 92 fL (ref 79–97)
Platelets: 206 10*3/uL (ref 150–450)
RBC: 4.49 x10E6/uL (ref 4.14–5.80)
RDW: 13.2 % (ref 11.6–15.4)
WBC: 6.8 10*3/uL (ref 3.4–10.8)

## 2022-07-26 ENCOUNTER — Other Ambulatory Visit: Payer: Self-pay

## 2022-07-26 ENCOUNTER — Telehealth: Payer: Self-pay | Admitting: Family Medicine

## 2022-07-26 MED ORDER — METFORMIN HCL 1000 MG PO TABS: ORAL_TABLET | ORAL | 0 refills | Status: DC

## 2022-07-26 NOTE — Telephone Encounter (Signed)
Prescription Request  07/26/2022  LOV: 07/08/2022  What is the name of the medication or equipment? metFORMIN (GLUCOPHAGE) 1000 MG tablet   Have you contacted your pharmacy to request a refill? Yes   Which pharmacy would you like this sent to?  [facility] [place], [place] - [address] [place] [address] Phone: [phone] Fax: [phone]    Patient notified that their request is being sent to the clinical staff for review and that they should receive a response within 2 business days.   Please advise at Mobile [phone] (mobile)

## 2022-07-26 NOTE — Telephone Encounter (Signed)
Refill sent to pharmacy.   

## 2022-07-29 ENCOUNTER — Other Ambulatory Visit: Payer: Self-pay | Admitting: *Deleted

## 2022-07-29 MED ORDER — METFORMIN HCL 1000 MG PO TABS: ORAL_TABLET | ORAL | 0 refills | Status: DC

## 2022-08-03 DIAGNOSIS — M542 Cervicalgia: Secondary | ICD-10-CM | POA: Diagnosis not present

## 2022-08-03 DIAGNOSIS — G894 Chronic pain syndrome: Secondary | ICD-10-CM | POA: Diagnosis not present

## 2022-08-03 DIAGNOSIS — M961 Postlaminectomy syndrome, not elsewhere classified: Secondary | ICD-10-CM | POA: Diagnosis not present

## 2022-08-03 DIAGNOSIS — Z79891 Long term (current) use of opiate analgesic: Secondary | ICD-10-CM | POA: Diagnosis not present

## 2022-08-03 DIAGNOSIS — M4322 Fusion of spine, cervical region: Secondary | ICD-10-CM | POA: Diagnosis not present

## 2022-08-03 DIAGNOSIS — M25569 Pain in unspecified knee: Secondary | ICD-10-CM | POA: Diagnosis not present

## 2022-08-03 DIAGNOSIS — M5451 Vertebrogenic low back pain: Secondary | ICD-10-CM | POA: Diagnosis not present

## 2022-08-17 NOTE — Progress Notes (Unsigned)
[facility]ENTER   MULTIDISCIPLINARY HEART VALVE CLINIC                                     Cardiology Office Note:    Date:  08/18/2022   ID:  Thomas [name], DOB [birth year], MRN [id number]  PCP:  Ardith Dark, MD  [facility] Cardiologist:  Kristeen Miss, MD / Dr. Clifton James, MD (TAVR) [facility] Electrophysiologist:  None   Referring MD: Ardith Dark, MD   Chief Complaint  Patient presents with   Follow-up    1 month s/p TAVR   History of Present Illness:    Thomas [name] is a 67 y.o. male with a hx of DMT2, HTN, HLD, CKD stage IIIa, prior CVA, cervical myelopathy with cord compression s/p spinal surgery (8/23) and severe aortic stenosis who presented to [facility] on 07/13/22 for planned TAVR and is being seen today for one month follow up.    Thomas [name] is followed by Dr. Elease Hashimoto for his cardiology care. He was referred to the structural heart team initially in Nov 2023 after finding severe aortic stenosis on pre-operative echocardiogram prior to cervical spine surgery. Echo at that time showed an LVEF at 60-65%, mild LVH, trivial MR, and moderately severe to severe aortic valve stenosis with mean gradient of 39 mmHg, peak gradient 68.6 mmHg, AVA 0.82 cm2, DI 0.29, SVI 32. Given cord compression, his cervical surgery was felt to be urgent and he is now s/p cervical spine decompression 10/28/21 with anterior cervical discectomies. He was seen post operatively by Dr. Elease Hashimoto on 04/17/22 with symptoms of LE edema, chest pain and dyspnea. EKG showed atrial flutter with RVR. He was admitted to [facility] and treated with amiodarone and started on Eliquis. He underwent a TEE guided cardioversion at that time. Repeat echo 04/26/22 showed reduced LVEF at 35% with global hypokinesis, mild MR, severe low flow/low gradient AS with mean gradient 24 mmHg, peak gradient 39 mmHg, AVA 0.81 cm2, SVI 25, DI 0.33. Cardiac cath 04/27/22 with minimal non-obstructive CAD. Cardiac CTA 04/28/22 with aortic  valve calcium score of 5671. Valve area 476 mm2 suitable for a 26 mm Edwards Sapien 3 Ultra valve or 29 mm Medtrontic Evolut valve.    He was seen in our office 05/12/22 by Perlie Gold, PA-C and was in rate controlled atrial flutter and underwent planned cardioversion 05/31/22.    He was then evaluated by the multidisciplinary valve team and felt to have severe, symptomatic aortic stenosis and to be a suitable candidate and is now s/p successful TAVR with a 26 mm Edwards Sapien 3 THV via the TF approach on 07/13/22. Post operative echo with mildly improved LVEF at 45-50% with a mean gradient at 12 mmHg and peak gradient at 23 mmHG, DVI 0.56. EOAi 1.43 cm2/m2. He was restarted on Eliqius for PAF. Does not require dental SBE as he is edentulous. He was noted to have BLE on exam that day and was instructed to take his PRN Lasix to help with this.   Today he is here with his wife and reports that he continues to do very well. He is walking several times per day for exercise with no symptoms of SOB. He denies chest pain, palpitations, orthopnea, dizziness, or syncope. He denies bleeding in stool or urine. He continues to have bilateral 2-3+ edema in his lower legs. He reports that he has been  taking his Lasix 40mg  about three times per week. The swelling will improve after taking it then will return about one day later. He has compression stocking but does not wear them. Unclear about salt intake in his diet.   Past Medical History:  Diagnosis Date   Cervical myelopathy (HCC)    Diabetes mellitus without complication (HCC)    Hypercholesterolemia    Hypertension    S/P TAVR (transcatheter aortic valve replacement) 07/13/2022   26mm S3UR with Dr. Clifton James and Dr. Delia Chimes   Severe aortic stenosis    Stroke [facility]) 1982   pt states he had a mini stroke in 1982    Past Surgical History:  Procedure Laterality Date   ANTERIOR CERVICAL DECOMP/DISCECTOMY FUSION N/A 10/28/2021   Procedure: ACDF - C3-C4 - C4-C5 -  C5-C6;  Surgeon: [name], MD;  Location: [facility] OR;  Service: Neurosurgery;  Laterality: N/A;   BACK SURGERY     CARDIOVERSION N/A 04/28/2022   Procedure: CARDIOVERSION;  Surgeon: Jodelle Red, MD;  Location: [facility] ENDOSCOPY;  Service: Cardiovascular;  Laterality: N/A;   CARDIOVERSION N/A 05/31/2022   Procedure: CARDIOVERSION;  Surgeon: Meriam Sprague, MD;  Location: [facility] ENDOSCOPY;  Service: Cardiovascular;  Laterality: N/A;   HERNIA REPAIR     INTRAOPERATIVE TRANSTHORACIC ECHOCARDIOGRAM N/A 07/13/2022   Procedure: INTRAOPERATIVE TRANSTHORACIC ECHOCARDIOGRAM;  Surgeon: Kathleene Hazel, MD;  Location: [facility] INVASIVE CV LAB;  Service: Open Heart Surgery;  Laterality: N/A;   RIGHT/LEFT HEART CATH AND CORONARY ANGIOGRAPHY N/A 04/27/2022   Procedure: RIGHT/LEFT HEART CATH AND CORONARY ANGIOGRAPHY;  Surgeon: Lennette Bihari, MD;  Location: [facility] INVASIVE CV LAB;  Service: Cardiovascular;  Laterality: N/A;   TEE WITHOUT CARDIOVERSION N/A 04/28/2022   Procedure: TRANSESOPHAGEAL ECHOCARDIOGRAM (TEE);  Surgeon: Jodelle Red, MD;  Location: [facility] ENDOSCOPY;  Service: Cardiovascular;  Laterality: N/A;   TRANSCATHETER AORTIC VALVE REPLACEMENT, TRANSFEMORAL N/A 07/13/2022   Procedure: Transcatheter Aortic Valve Replacement, Transfemoral;  Surgeon: Kathleene Hazel, MD;  Location: [facility] INVASIVE CV LAB;  Service: Open Heart Surgery;  Laterality: N/A;    Current Medications: Current Meds  Medication Sig   amiodarone (PACERONE) 200 MG tablet Take 1 tablet by mouth twice a day for 7 days then reduce to 1 tablet by mouth daily (Patient taking differently: Take 200 mg by mouth daily.)   apixaban (ELIQUIS) 5 MG TABS tablet Take 1 tablet (5 mg total) by mouth 2 (two) times daily.   Apple Cider Vinegar 500 MG TABS Take 500 mg by mouth at bedtime.   diclofenac sodium (VOLTAREN) 1 % GEL Apply 1 Application topically 3 (three) times daily as needed (pain).   furosemide (LASIX) 40 MG tablet Take 1  tablet (40 mg total) by mouth daily.   gabapentin (NEURONTIN) 400 MG capsule Take 400 mg by mouth 3 (three) times daily.   Ginger, Zingiber officinalis, (GINGER PO) Take 500 mg by mouth daily.   glimepiride (AMARYL) 4 MG tablet Take 1 tablet (4 mg total) by mouth daily with breakfast.   Lancets (ONETOUCH DELICA PLUS LANCET33G) MISC CHECK BLOOD SUGAR TWICE DAILY   levothyroxine (SYNTHROID) 150 MCG tablet TAKE ONE TABLET ONCE DAILY BEFORE BREAKFAST   metFORMIN (GLUCOPHAGE) 1000 MG tablet TAKE 1 TABLET (1,000 MG TOTAL) BY MOUTH TWICE A DAY WITH FOOD.   morphine (MSIR) 15 MG tablet Take 15 mg by mouth every 8 (eight) hours as needed for moderate pain.   ONETOUCH VERIO test strip CHECK BLOOD SUGAR UP TO THREE TIMES DAILY AS DIRECTED   rosuvastatin (  CRESTOR) 10 MG tablet Take 1 tablet (10 mg total) by mouth daily. (Patient taking differently: Take 10 mg by mouth at bedtime.)   Turmeric (QC TUMERIC COMPLEX) 500 MG CAPS Take 500 mg by mouth at bedtime.   [DISCONTINUED] furosemide (LASIX) 40 MG tablet Take 1 tablet (40 mg total) by mouth daily as needed. (Patient taking differently: Take 40 mg by mouth 3 (three) times a week.)     Allergies:   Trulicity [dulaglutide], Januvia [sitagliptin], Other, and Penicillins   Social History   Socioeconomic History   Marital status: Married    Spouse name: Not on file   Number of children: 3   Years of education: Not on file   Highest education level: Not on file  Occupational History   Occupation: Retired from Designer, fashion/clothing, also drove a dump truck  Tobacco Use   Smoking status: Never   Smokeless tobacco: Never  Building services engineer Use: Never used  Substance and Sexual Activity   Alcohol use: Never    Comment: occ beer    Drug use: No   Sexual activity: Not on file  Other Topics Concern   Not on file  Social History Narrative   Not on file   Social Determinants of Health   Financial Resource Strain: Low Risk  (03/04/2022)   Overall Financial  Resource Strain (CARDIA)    Difficulty of Paying Living Expenses: Not hard at all  Food Insecurity: No Food Insecurity (07/13/2022)   Hunger Vital Sign    Worried About Running Out of Food in the Last Year: Never true    Ran Out of Food in the Last Year: Never true  Transportation Needs: No Transportation Needs (07/13/2022)   PRAPARE - Administrator, Civil Service (Medical): No    Lack of Transportation (Non-Medical): No  Physical Activity: Insufficiently Active (03/04/2022)   Exercise Vital Sign    Days of Exercise per Week: 7 days    Minutes of Exercise per Session: 20 min  Stress: No Stress Concern Present (03/04/2022)   [employer] of Occupational Health - Occupational Stress Questionnaire    Feeling of Stress : Not at all  Social Connections: Moderately Isolated (03/04/2022)   Social Connection and Isolation Panel [NHANES]    Frequency of Communication with Friends and Family: Once a week    Frequency of Social Gatherings with Friends and Family: More than three times a week    Attends Religious Services: Never    Database administrator or Organizations: No    Attends Engineer, structural: Never    Marital Status: Married     Family History: The patient's family history includes Arthritis in his father and mother; Breast cancer in his sister; Diabetes in his mother; Heart attack in his mother; Hypertension in his mother; Skin cancer in his father.  ROS:   Please see the history of present illness.    All other systems reviewed and are negative.  EKGs/Labs/Other Studies Reviewed:    The following studies were reviewed today:   Echocardiogram 08/18/22:   1. Left ventricular ejection fraction, by estimation, is 50 to 55%. The  left ventricle has low normal function. The left ventricle has no regional  wall motion abnormalities. There is mild left ventricular hypertrophy.  Left ventricular diastolic  parameters are indeterminate.   2. Right  ventricular systolic function is normal. The right ventricular  size is normal. Tricuspid regurgitation signal is inadequate for assessing  PA pressure.  3. The mitral valve is normal in structure. No evidence of mitral valve  regurgitation. No evidence of mitral stenosis.   4. The aortic valve has been repaired/replaced. Aortic valve  regurgitation is trivial. There is a 26 mm Edwards Sapien prosthetic  (TAVR) valve present in the aortic position. Aortic valve mean gradient  measures 14.0 mmHg. Vmax 2.5 m/s, MG 14 mmHg, EOA  2.0 cm^2, DI 0.40. Trivial PVL   5. The inferior vena cava is normal in size with greater than 50%  respiratory variability, suggesting right atrial pressure of 3 mmHg.    HEART AND VASCULAR CENTER  TAVR OPERATIVE NOTE     Date of Procedure:                07/13/2022   Preoperative Diagnosis:      Severe Aortic Stenosis    Postoperative Diagnosis:    Same    Procedure:        Transcatheter Aortic Valve Replacement - Transfemoral Approach             Edwards Sapien 3 THV (size 26 mm, model # [id number], serial # [id number] )              Co-Surgeons:                        Verne Carrow, MD and Clare Charon, MD    Anesthesiologist:                  Maple Hudson   Echocardiographer:              Izora Ribas   Pre-operative Echo Findings: Severe aortic stenosis Normal left ventricular systolic function   Post-operative Echo Findings: Trivial paravalvular leak Normal left ventricular systolic function ____________   Echo 07/14/22:   1. The aortic valve has been replaced by a 26 mm Sapien Valve. Aortic  valve regurgitation is not visualized.Mean gradient 12 mm Hg, Peak  gradient 23 mm HG, DVI 0.56. EOAi 1.43 cm2/m2.   2. Left ventricular ejection fraction, by estimation, is 45 to 50%. The  left ventricle has mildly decreased function. The left ventricle has no  regional wall motion abnormalities. There is moderate concentric left  ventricular  hypertrophy. Left  ventricular diastolic parameters are consistent with Grade II diastolic  dysfunction (pseudonormalization).   3. Right ventricular systolic function is normal. The right ventricular  size is mildly enlarged.   4. The mitral valve is grossly normal. No evidence of mitral valve  regurgitation. No evidence of mitral stenosis. The mean mitral valve  gradient is 2.5 mmHg.   5. Aortic dilatation noted. There is mild dilatation of the ascending  aorta, measuring 40 mm.   6. The inferior vena cava is normal in size with greater than 50%  respiratory variability, suggesting right atrial pressure of 3 mmHg.   Comparison(s): Prior images reviewed side by side. LV appears more  vigorous. PVL no longer seen.   EKG:  EKG is not ordered today.   Recent Labs: 04/27/2022: TSH 0.361 07/09/2022: ALT 27 07/14/2022: Magnesium 1.6 07/19/2022: BUN 21; Creatinine, Ser 1.42; Hemoglobin 13.3; Platelets 206; Potassium 4.3; Sodium 142   Recent Lipid Panel    Component Value Date/Time   CHOL 91 04/27/2022 0334   TRIG 77 04/27/2022 0334   HDL 30 (L) 04/27/2022 0334   CHOLHDL 3.0 04/27/2022 0334   VLDL 15 04/27/2022 0334   LDLCALC 46 04/27/2022 0334   LDLDIRECT 76.0 10/09/2020 0840  Physical Exam:    VS:  BP 138/66   Pulse (!) 56   Ht 6' (1.829 m)   SpO2 92%   BMI 30.49 kg/m     Wt Readings from Last 3 Encounters:  07/19/22 224 lb 12.8 oz (102 kg)  07/14/22 214 lb 15.2 oz (97.5 kg)  07/09/22 226 lb 11.2 oz (102.8 kg)    General: Well developed, well nourished, NAD Lungs:Clear to ausculation bilaterally. No wheezes, rales, or rhonchi. Breathing is unlabored. Cardiovascular: RRR with S1 S2. No murmurs Extremities: 2-3+ BLE  Neuro: Alert and oriented. No focal deficits. No facial asymmetry. MAE spontaneously. Psych: Responds to questions appropriately with normal affect.    ASSESSMENT/PLAN:    Severe AS: Patient doing well with NYHA class I symptoms s/p successful TAVR with a  26 mm Edwards Sapien 3 THV via the TF approach on 07/13/22. Echo today with LVEF at 50-55% with a mean gradient at , peak at 25.101mmHg, AVA by 1.79cm2, DI 0.40 and with trivial PVL. Continue Eliqius monotherapy. Does not require dental SBE as he is edentulous. Plan 2-3 week follow up with myself given BLE (see plan below).   Chronic diastolic HF: Continues to have 2-3+ BLE despite taking PRN Lasix 40mg  about three times per week. Plan to increase dosing to daily and see him back in 2-3 weeks for repeat labs and follow up. If improved, likely will decrease to 20mg  QD given renal disease. Obtain BMET today.   CKD stage IIIa: Baseline Cr appears to be in the 1.5-1.6 range. As above, increase Lasix given LE edema and follow BMET today and in 2-3 weeks.   HTN: Stable with no changes    HLD: Continue Crestor   Prior CVA: No new neuro changes.    Cervical myelopathy with cord compression s/p spinal surgery (8/23): Deferred CRII    PAF s/p DCCV: Currenlty maintaining SB. Continue Eliquis and Amiodarone. No BB given baseline bradycardia.    PVC/PACs: Denies symptoms.   Incidental findings: Circumferential wall thickening of the distal right colon, possibly due to decompression, although neoplasm could have a similar appearance. Recommend further evaluation with colonoscopy. Discussed with patient and family. He has never had a colonoscopy. High encouraged to undergo colonoscopy given the above and no prior testing. Encouraged to move follow up from 12/2022 closer. Denies bleeding in stool and no recent change in bowel habits.   Medication Adjustments/Labs and Tests Ordered: Current medicines are reviewed at length with the patient today.  Concerns regarding medicines are outlined above.  Orders Placed This Encounter  Procedures   Basic metabolic panel   Meds ordered this encounter  Medications   furosemide (LASIX) 40 MG tablet    Sig: Take 1 tablet (40 mg total) by mouth daily.    Dispense:   90 tablet    Refill:  3    Patient Instructions  Medication Instructions:  Your physician has recommended you make the following change in your medication:  INCREASE LASIX TO 40 MG DAILY.  *If you need a refill on your cardiac medications before your next appointment, please call your pharmacy*   Lab Work: TODAY: BMET If you have labs (blood work) drawn today and your tests are completely normal, you will receive your results only by: MyChart Message (if you have MyChart) OR A paper copy in the mail If you have any lab test that is abnormal or we need to change your treatment, we will call you to review the results.   Testing/Procedures:  NONE   Follow-Up: At [facility], you and your health needs are our priority.  As part of our continuing mission to provide you with exceptional heart care, we have created designated Provider Care Teams.  These Care Teams include your primary Cardiologist (physician) and Advanced Practice Providers (APPs -  Physician Assistants and Nurse Practitioners) who all work together to provide you with the care you need, when you need it.  We recommend signing up for the patient portal called "MyChart".  Sign up information is provided on this After Visit Summary.  MyChart is used to connect with patients for Virtual Visits (Telemedicine).  Patients are able to view lab/test results, encounter notes, upcoming appointments, etc.  Non-urgent messages can be sent to your provider as well.   To learn more about what you can do with MyChart, go to [email or web address].    Your next appointment:   KEEP SCHEDULED FOLLOW-UP   Signed, Georgie Chard, NP  08/18/2022 2:09 PM    [facility]

## 2022-08-18 ENCOUNTER — Ambulatory Visit: Payer: Medicare Other | Admitting: Cardiology

## 2022-08-18 ENCOUNTER — Ambulatory Visit: Payer: Medicare Other | Attending: Cardiology

## 2022-08-18 VITALS — BP 138/66 | HR 56 | Ht 72.0 in

## 2022-08-18 DIAGNOSIS — E1122 Type 2 diabetes mellitus with diabetic chronic kidney disease: Secondary | ICD-10-CM

## 2022-08-18 DIAGNOSIS — Z952 Presence of prosthetic heart valve: Secondary | ICD-10-CM | POA: Insufficient documentation

## 2022-08-18 DIAGNOSIS — I10 Essential (primary) hypertension: Secondary | ICD-10-CM

## 2022-08-18 DIAGNOSIS — I350 Nonrheumatic aortic (valve) stenosis: Secondary | ICD-10-CM | POA: Diagnosis not present

## 2022-08-18 DIAGNOSIS — Z79899 Other long term (current) drug therapy: Secondary | ICD-10-CM

## 2022-08-18 DIAGNOSIS — I5042 Chronic combined systolic (congestive) and diastolic (congestive) heart failure: Secondary | ICD-10-CM | POA: Insufficient documentation

## 2022-08-18 DIAGNOSIS — N183 Chronic kidney disease, stage 3 unspecified: Secondary | ICD-10-CM | POA: Diagnosis not present

## 2022-08-18 LAB — ECHOCARDIOGRAM COMPLETE
AR max vel: 1.75 cm2
AV Area VTI: 1.79 cm2
AV Area mean vel: 1.71 cm2
AV Mean grad: 14 mmHg
AV Peak grad: 25.6 mmHg
Ao pk vel: 2.53 m/s
Area-P 1/2: 2.03 cm2
P 1/2 time: 346 msec
S' Lateral: 3.4 cm

## 2022-08-18 MED ORDER — FUROSEMIDE 40 MG PO TABS: 40.0000 mg | ORAL_TABLET | Freq: Every day | ORAL | 3 refills | Status: DC

## 2022-08-18 NOTE — Patient Instructions (Signed)
Medication Instructions:  Your physician has recommended you make the following change in your medication:  INCREASE LASIX TO 40 MG DAILY.  *If you need a refill on your cardiac medications before your next appointment, please call your pharmacy*   Lab Work: TODAY: BMET If you have labs (blood work) drawn today and your tests are completely normal, you will receive your results only by: MyChart Message (if you have MyChart) OR A paper copy in the mail If you have any lab test that is abnormal or we need to change your treatment, we will call you to review the results.   Testing/Procedures: NONE   Follow-Up: At [facility], you and your health needs are our priority.  As part of our continuing mission to provide you with exceptional heart care, we have created designated Provider Care Teams.  These Care Teams include your primary Cardiologist (physician) and Advanced Practice Providers (APPs -  Physician Assistants and Nurse Practitioners) who all work together to provide you with the care you need, when you need it.  We recommend signing up for the patient portal called "MyChart".  Sign up information is provided on this After Visit Summary.  MyChart is used to connect with patients for Virtual Visits (Telemedicine).  Patients are able to view lab/test results, encounter notes, upcoming appointments, etc.  Non-urgent messages can be sent to your provider as well.   To learn more about what you can do with MyChart, go to [email or web address].    Your next appointment:   KEEP SCHEDULED FOLLOW-UP

## 2022-08-19 ENCOUNTER — Other Ambulatory Visit: Payer: Self-pay | Admitting: Cardiology

## 2022-08-19 LAB — BASIC METABOLIC PANEL
BUN/Creatinine Ratio: 16 (ref 10–24)
BUN: 23 mg/dL (ref 8–27)
CO2: 25 mmol/L (ref 20–29)
Calcium: 9 mg/dL (ref 8.6–10.2)
Chloride: 101 mmol/L (ref 96–106)
Creatinine, Ser: 1.42 mg/dL — ABNORMAL HIGH (ref 0.76–1.27)
Glucose: 74 mg/dL (ref 70–99)
Potassium: 3.8 mmol/L (ref 3.5–5.2)
Sodium: 142 mmol/L (ref 134–144)
eGFR: 54 mL/min/{1.73_m2} — ABNORMAL LOW (ref >59)

## 2022-08-19 MED ORDER — POTASSIUM CHLORIDE CRYS ER 20 MEQ PO TBCR: 20.0000 meq | EXTENDED_RELEASE_TABLET | Freq: Every day | ORAL | 1 refills | Status: DC

## 2022-08-20 ENCOUNTER — Telehealth: Payer: Self-pay | Admitting: Cardiology

## 2022-08-20 NOTE — Telephone Encounter (Signed)
Error in charting.

## 2022-09-02 NOTE — Progress Notes (Signed)
[facility]ASC[facility] CENTER   MULTIDISCIPLINARY HEART VALVE CLINIC                                     Cardiology Office Note:    Date:  09/07/2022   ID:  Thomas [name], DOB [birth year], MRN [id number]  PCP:  Ardith Dark, MD  [facility] Cardiologist:  Kristeen Miss, MD  / Dr. Clifton James, MD (TAVR)  [facility] Electrophysiologist:  None   Referring MD: Ardith Dark, MD   Chief Complaint  Patient presents with   Labs Only   Follow-up    Follow up BLE   History of Present Illness:    Thomas [name] is a 67 y.o. male with a hx of DMT2, HTN, HLD, CKD stage IIIa, prior CVA, cervical myelopathy with cord compression s/p spinal surgery (8/23) and severe aortic stenosis who presented to [facility] on 07/13/22 for planned TAVR and is being seen today for follow up of LE edema.    [name] is followed by Dr. Elease Hashimoto for his cardiology care. He was referred to the structural heart team initially in Nov 2023 after finding severe aortic stenosis on pre-operative echocardiogram prior to cervical spine surgery. Echo at that time showed an LVEF at 60-65%, mild LVH, trivial MR, and moderately severe to severe aortic valve stenosis with mean gradient of 39 mmHg, peak gradient 68.6 mmHg, AVA 0.82 cm2, DI 0.29, SVI 32. Given cord compression, his cervical surgery was felt to be urgent and he is now s/p cervical spine decompression 10/28/21 with anterior cervical discectomies. He was seen post operatively by Dr. Elease Hashimoto on 04/17/22 with symptoms of LE edema, chest pain and dyspnea. EKG showed atrial flutter with RVR. He was admitted to [facility] and treated with amiodarone and started on Eliquis. He underwent a TEE guided cardioversion at that time. Repeat echo 04/26/22 showed reduced LVEF at 35% with global hypokinesis, mild MR, severe low flow/low gradient AS with mean gradient 24 mmHg, peak gradient 39 mmHg, AVA 0.81 cm2, SVI 25, DI 0.33. Cardiac cath 04/27/22 with minimal non-obstructive CAD. Cardiac CTA 04/28/22  with aortic valve calcium score of 5671. Valve area 476 mm2 suitable for a 26 mm Edwards Sapien 3 Ultra valve or 29 mm Medtrontic Evolut valve.    He was then evaluated by the multidisciplinary valve team and felt to have severe, symptomatic aortic stenosis and to be a suitable candidate and is now s/p successful TAVR with a 26 mm Edwards Sapien 3 THV via the TF approach on 07/13/22. Post operative echo with mildly improved LVEF at 45-50% with a mean gradient at 12 mmHg and peak gradient at 23 mmHG, DVI 0.56. EOAi 1.43 cm2/m2. He was restarted on Eliqius for PAF. Does not require dental SBE as he is edentulous. He was noted to have BLE on exam that day and was instructed to take his PRN Lasix to help with this.   At one month follow up, he was doing very well however had 2-3+ bilateral LE edema. He was only taking intermittent Lasix. Today edema looks mildly improved at best with taking Lasix 40mg  QD. BMET today with stable Cr at 1.4. He continues to walk daily for exercise and thinks this is the reason his legs are edematous. He has compression stockings but has trouble putting this on. Otherwise he denies chest pain, palpitations, orthopnea, dizziness, or syncope. He denies bleeding in stool  or urine.    Past Medical History:  Diagnosis Date   Cervical myelopathy (HCC)    Diabetes mellitus without complication (HCC)    Hypercholesterolemia    Hypertension    S/P TAVR (transcatheter aortic valve replacement) 07/13/2022   26mm S3UR with Dr. Clifton James and Dr. Delia Chimes   Severe aortic stenosis    Stroke [facility]) 1982   pt states he had a mini stroke in 1982    Past Surgical History:  Procedure Laterality Date   ANTERIOR CERVICAL DECOMP/DISCECTOMY FUSION N/A 10/28/2021   Procedure: ACDF - C3-C4 - C4-C5 - C5-C6;  Surgeon: [name], MD;  Location: [facility] OR;  Service: Neurosurgery;  Laterality: N/A;   BACK SURGERY     CARDIOVERSION N/A 04/28/2022   Procedure: CARDIOVERSION;  Surgeon: Jodelle Red,  MD;  Location: [facility] ENDOSCOPY;  Service: Cardiovascular;  Laterality: N/A;   CARDIOVERSION N/A 05/31/2022   Procedure: CARDIOVERSION;  Surgeon: Meriam Sprague, MD;  Location: [facility] ENDOSCOPY;  Service: Cardiovascular;  Laterality: N/A;   HERNIA REPAIR     INTRAOPERATIVE TRANSTHORACIC ECHOCARDIOGRAM N/A 07/13/2022   Procedure: INTRAOPERATIVE TRANSTHORACIC ECHOCARDIOGRAM;  Surgeon: Kathleene Hazel, MD;  Location: [facility] INVASIVE CV LAB;  Service: Open Heart Surgery;  Laterality: N/A;   RIGHT/LEFT HEART CATH AND CORONARY ANGIOGRAPHY N/A 04/27/2022   Procedure: RIGHT/LEFT HEART CATH AND CORONARY ANGIOGRAPHY;  Surgeon: Lennette Bihari, MD;  Location: [facility] INVASIVE CV LAB;  Service: Cardiovascular;  Laterality: N/A;   TEE WITHOUT CARDIOVERSION N/A 04/28/2022   Procedure: TRANSESOPHAGEAL ECHOCARDIOGRAM (TEE);  Surgeon: Jodelle Red, MD;  Location: [facility] ENDOSCOPY;  Service: Cardiovascular;  Laterality: N/A;   TRANSCATHETER AORTIC VALVE REPLACEMENT, TRANSFEMORAL N/A 07/13/2022   Procedure: Transcatheter Aortic Valve Replacement, Transfemoral;  Surgeon: Kathleene Hazel, MD;  Location: [facility] INVASIVE CV LAB;  Service: Open Heart Surgery;  Laterality: N/A;    Current Medications: Current Meds  Medication Sig   amiodarone (PACERONE) 200 MG tablet Take 1 tablet by mouth twice a day for 7 days then reduce to 1 tablet by mouth daily (Patient taking differently: Take 200 mg by mouth daily.)   apixaban (ELIQUIS) 5 MG TABS tablet Take 1 tablet (5 mg total) by mouth 2 (two) times daily.   Apple Cider Vinegar 500 MG TABS Take 500 mg by mouth at bedtime.   diclofenac sodium (VOLTAREN) 1 % GEL Apply 1 Application topically 3 (three) times daily as needed (pain).   furosemide (LASIX) 40 MG tablet Take 1 tablet (40 mg total) by mouth daily.   gabapentin (NEURONTIN) 400 MG capsule Take 400 mg by mouth 3 (three) times daily.   Ginger, Zingiber officinalis, (GINGER PO) Take 500 mg by mouth daily.   glimepiride  (AMARYL) 4 MG tablet Take 1 tablet (4 mg total) by mouth daily with breakfast.   Lancets (ONETOUCH DELICA PLUS LANCET33G) MISC CHECK BLOOD SUGAR TWICE DAILY   levothyroxine (SYNTHROID) 150 MCG tablet TAKE ONE TABLET ONCE DAILY BEFORE BREAKFAST   metFORMIN (GLUCOPHAGE) 1000 MG tablet TAKE 1 TABLET (1,000 MG TOTAL) BY MOUTH TWICE A DAY WITH FOOD.   morphine (MSIR) 15 MG tablet Take 15 mg by mouth every 8 (eight) hours as needed for moderate pain.   ONETOUCH VERIO test strip CHECK BLOOD SUGAR UP TO THREE TIMES DAILY AS DIRECTED   potassium chloride SA (KLOR-CON M) 20 MEQ tablet Take 1 tablet (20 mEq total) by mouth daily.   rosuvastatin (CRESTOR) 10 MG tablet Take 1 tablet (10 mg total) by mouth daily. (Patient taking differently: Take  10 mg by mouth at bedtime.)   Turmeric (QC TUMERIC COMPLEX) 500 MG CAPS Take 500 mg by mouth at bedtime.     Allergies:   Trulicity [dulaglutide], Januvia [sitagliptin], Other, and Penicillins   Social History   Socioeconomic History   Marital status: Married    Spouse name: Not on file   Number of children: 3   Years of education: Not on file   Highest education level: Not on file  Occupational History   Occupation: Retired from Designer, fashion/clothing, also drove a dump truck  Tobacco Use   Smoking status: Never   Smokeless tobacco: Never  Building services engineer Use: Never used  Substance and Sexual Activity   Alcohol use: Never    Comment: occ beer    Drug use: No   Sexual activity: Not on file  Other Topics Concern   Not on file  Social History Narrative   Not on file   Social Determinants of Health   Financial Resource Strain: Low Risk  (03/04/2022)   Overall Financial Resource Strain (CARDIA)    Difficulty of Paying Living Expenses: Not hard at all  Food Insecurity: No Food Insecurity (07/13/2022)   Hunger Vital Sign    Worried About Running Out of Food in the Last Year: Never true    Ran Out of Food in the Last Year: Never true  Transportation Needs: No  Transportation Needs (07/13/2022)   PRAPARE - Administrator, Civil Service (Medical): No    Lack of Transportation (Non-Medical): No  Physical Activity: Insufficiently Active (03/04/2022)   Exercise Vital Sign    Days of Exercise per Week: 7 days    Minutes of Exercise per Session: 20 min  Stress: No Stress Concern Present (03/04/2022)   [employer] of Occupational Health - Occupational Stress Questionnaire    Feeling of Stress : Not at all  Social Connections: Moderately Isolated (03/04/2022)   Social Connection and Isolation Panel [NHANES]    Frequency of Communication with Friends and Family: Once a week    Frequency of Social Gatherings with Friends and Family: More than three times a week    Attends Religious Services: Never    Database administrator or Organizations: No    Attends Engineer, structural: Never    Marital Status: Married    Family History: The patient's family history includes Arthritis in his father and mother; Breast cancer in his sister; Diabetes in his mother; Heart attack in his mother; Hypertension in his mother; Skin cancer in his father.  ROS:   Please see the history of present illness.    All other systems reviewed and are negative.  EKGs/Labs/Other Studies Reviewed:    The following studies were reviewed today:   Echocardiogram 08/18/22:   1. Left ventricular ejection fraction, by estimation, is 50 to 55%. The  left ventricle has low normal function. The left ventricle has no regional  wall motion abnormalities. There is mild left ventricular hypertrophy.  Left ventricular diastolic  parameters are indeterminate.   2. Right ventricular systolic function is normal. The right ventricular  size is normal. Tricuspid regurgitation signal is inadequate for assessing  PA pressure.   3. The mitral valve is normal in structure. No evidence of mitral valve  regurgitation. No evidence of mitral stenosis.   4. The aortic valve has  been repaired/replaced. Aortic valve  regurgitation is trivial. There is a 26 mm Edwards Sapien prosthetic  (TAVR) valve present in the  aortic position. Aortic valve mean gradient  measures 14.0 mmHg. Vmax 2.5 m/s, MG 14 mmHg, EOA  2.0 cm^2, DI 0.40. Trivial PVL   5. The inferior vena cava is normal in size with greater than 50%  respiratory variability, suggesting right atrial pressure of 3 mmHg.      HEART AND VASCULAR CENTER  TAVR OPERATIVE NOTE     Date of Procedure:                07/13/2022   Preoperative Diagnosis:      Severe Aortic Stenosis    Postoperative Diagnosis:    Same    Procedure:        Transcatheter Aortic Valve Replacement - Transfemoral Approach             Edwards Sapien 3 THV (size 26 mm, model # [id number], serial # [id number] )              Co-Surgeons:                        Verne Carrow, MD and Clare Charon, MD    Anesthesiologist:                  Maple Hudson   Echocardiographer:              Izora Ribas   Pre-operative Echo Findings: Severe aortic stenosis Normal left ventricular systolic function   Post-operative Echo Findings: Trivial paravalvular leak Normal left ventricular systolic function ____________   Echo 07/14/22:   1. The aortic valve has been replaced by a 26 mm Sapien Valve. Aortic  valve regurgitation is not visualized.Mean gradient 12 mm Hg, Peak  gradient 23 mm HG, DVI 0.56. EOAi 1.43 cm2/m2.   2. Left ventricular ejection fraction, by estimation, is 45 to 50%. The  left ventricle has mildly decreased function. The left ventricle has no  regional wall motion abnormalities. There is moderate concentric left  ventricular hypertrophy. Left  ventricular diastolic parameters are consistent with Grade II diastolic  dysfunction (pseudonormalization).   3. Right ventricular systolic function is normal. The right ventricular  size is mildly enlarged.   4. The mitral valve is grossly normal. No evidence of mitral valve   regurgitation. No evidence of mitral stenosis. The mean mitral valve  gradient is 2.5 mmHg.   5. Aortic dilatation noted. There is mild dilatation of the ascending  aorta, measuring 40 mm.   6. The inferior vena cava is normal in size with greater than 50%  respiratory variability, suggesting right atrial pressure of 3 mmHg.   Comparison(s): Prior images reviewed side by side. LV appears more  vigorous. PVL no longer seen.     EKG:  EKG is not ordered today.  Recent Labs: 04/27/2022: TSH 0.361 07/09/2022: ALT 27 07/14/2022: Magnesium 1.6 07/19/2022: Hemoglobin 13.3; Platelets 206 09/06/2022: BUN 21; Creatinine, Ser 1.48; Potassium 4.1; Sodium 145   Recent Lipid Panel    Component Value Date/Time   CHOL 91 04/27/2022 0334   TRIG 77 04/27/2022 0334   HDL 30 (L) 04/27/2022 0334   CHOLHDL 3.0 04/27/2022 0334   VLDL 15 04/27/2022 0334   LDLCALC 46 04/27/2022 0334   LDLDIRECT 76.0 10/09/2020 0840    Physical Exam:    VS:  BP 130/70   Pulse (!) 50   Ht 6' (1.829 m)   Wt 221 lb 12.8 oz (100.6 kg)   SpO2 93%   BMI 30.08 kg/m  Wt Readings from Last 3 Encounters:  09/06/22 221 lb 12.8 oz (100.6 kg)  07/19/22 224 lb 12.8 oz (102 kg)  07/14/22 214 lb 15.2 oz (97.5 kg)    General: Well developed, well nourished, NAD Lungs:Clear to ausculation bilaterally. No wheezes, rales, or rhonchi. Breathing is unlabored. Cardiovascular: RRR with S1 S2. No murmurs Extremities: 2+ BLE.  Neuro: Alert and oriented. No focal deficits. No facial asymmetry. MAE spontaneously. Psych: Responds to questions appropriately with normal affect.    ASSESSMENT/PLAN:    Severe AS: Patient doing well with NYHA class I symptoms s/p successful TAVR with a 26 mm Edwards Sapien 3 THV via the TF approach on 07/13/22. Echo today with LVEF at 50-55% with a mean gradient at , peak at 25.17mmHg, AVA by 1.79cm2, DI 0.40 and with trivial PVL. Continue Eliqius monotherapy. Does not require dental SBE as he is  edentulous. Plan 1 week follow up as below.   Chronic diastolic HF: Continues to have 2-3+ BLE despite taking Lasix 40mg  QD. Cr stable today at 1.4. Plan to stop Lasix and start torsemide 20mg  QD tomorrow. Scheduled for one week follow up with myself with BMET.    CKD stage IIIa: Baseline Cr appears to be in the 1.5-1.6 range. Cr stable at 1.4.    HTN: Stable with no changes    HLD: Continue Crestor   Prior CVA: No new neuro changes.    Cervical myelopathy with cord compression s/p spinal surgery (8/23): Deferred CRII    PAF s/p DCCV: Currenlty maintaining SB. Continue Eliquis and Amiodarone. No BB given baseline bradycardia.    PVC/PACs: Denies symptoms.    Incidental findings: Circumferential wall thickening of the distal right colon, possibly due to decompression, although neoplasm could have a similar appearance. Recommend further evaluation with colonoscopy. Discussed with patient and family. He has never had a colonoscopy. High encouraged to undergo colonoscopy given the above and no prior testing. Encouraged to move follow up from 12/2022 closer. Denies bleeding in stool and no recent change in bowel habits.   Medication Adjustments/Labs and Tests Ordered: Current medicines are reviewed at length with the patient today.  Concerns regarding medicines are outlined above.  Orders Placed This Encounter  Procedures   Basic metabolic panel   No orders of the defined types were placed in this encounter.   Patient Instructions  Medication Instructions:  Your physician recommends that you continue on your current medications as directed. Please refer to the Current Medication list given to you today.  *If you need a refill on your cardiac medications before your next appointment, please call your pharmacy*   Lab Work: TODAY: BMET If you have labs (blood work) drawn today and your tests are completely normal, you will receive your results only by: MyChart Message (if you have  MyChart) OR A paper copy in the mail If you have any lab test that is abnormal or we need to change your treatment, we will call you to review the results.   Testing/Procedures: NONE   Follow-Up: At [facility], you and your health needs are our priority.  As part of our continuing mission to provide you with exceptional heart care, we have created designated Provider Care Teams.  These Care Teams include your primary Cardiologist (physician) and Advanced Practice Providers (APPs -  Physician Assistants and Nurse Practitioners) who all work together to provide you with the care you need, when you need it.  We recommend signing up for the patient portal called "  MyChart".  Sign up information is provided on this After Visit Summary.  MyChart is used to connect with patients for Virtual Visits (Telemedicine).  Patients are able to view lab/test results, encounter notes, upcoming appointments, etc.  Non-urgent messages can be sent to your provider as well.   To learn more about what you can do with MyChart, go to [email or web address].    Your next appointment:   Bayla Mcgovern WILL CALL YOU WITH A FOLLOW-UP AFTER LAB RESULTS    Signed, Georgie Chard, NP  09/07/2022 11:30 AM    [facility]

## 2022-09-06 ENCOUNTER — Ambulatory Visit: Payer: Medicare Other | Attending: Cardiology | Admitting: Cardiology

## 2022-09-06 VITALS — BP 130/70 | HR 50 | Ht 72.0 in | Wt 221.8 lb

## 2022-09-06 DIAGNOSIS — N183 Chronic kidney disease, stage 3 unspecified: Secondary | ICD-10-CM | POA: Diagnosis not present

## 2022-09-06 DIAGNOSIS — I5042 Chronic combined systolic (congestive) and diastolic (congestive) heart failure: Secondary | ICD-10-CM | POA: Diagnosis not present

## 2022-09-06 DIAGNOSIS — Z79899 Other long term (current) drug therapy: Secondary | ICD-10-CM | POA: Diagnosis not present

## 2022-09-06 DIAGNOSIS — I483 Typical atrial flutter: Secondary | ICD-10-CM | POA: Diagnosis not present

## 2022-09-06 DIAGNOSIS — I10 Essential (primary) hypertension: Secondary | ICD-10-CM | POA: Diagnosis not present

## 2022-09-06 DIAGNOSIS — I350 Nonrheumatic aortic (valve) stenosis: Secondary | ICD-10-CM | POA: Diagnosis not present

## 2022-09-06 DIAGNOSIS — E1122 Type 2 diabetes mellitus with diabetic chronic kidney disease: Secondary | ICD-10-CM

## 2022-09-06 DIAGNOSIS — Z7984 Long term (current) use of oral hypoglycemic drugs: Secondary | ICD-10-CM

## 2022-09-06 DIAGNOSIS — Z952 Presence of prosthetic heart valve: Secondary | ICD-10-CM | POA: Diagnosis not present

## 2022-09-06 DIAGNOSIS — K639 Disease of intestine, unspecified: Secondary | ICD-10-CM

## 2022-09-06 NOTE — Patient Instructions (Addendum)
Medication Instructions:  Your physician recommends that you continue on your current medications as directed. Please refer to the Current Medication list given to you today.  *If you need a refill on your cardiac medications before your next appointment, please call your pharmacy*   Lab Work: TODAY: BMET If you have labs (blood work) drawn today and your tests are completely normal, you will receive your results only by: MyChart Message (if you have MyChart) OR A paper copy in the mail If you have any lab test that is abnormal or we need to change your treatment, we will call you to review the results.   Testing/Procedures: NONE   Follow-Up: At [facility], you and your health needs are our priority.  As part of our continuing mission to provide you with exceptional heart care, we have created designated Provider Care Teams.  These Care Teams include your primary Cardiologist (physician) and Advanced Practice Providers (APPs -  Physician Assistants and Nurse Practitioners) who all work together to provide you with the care you need, when you need it.  We recommend signing up for the patient portal called "MyChart".  Sign up information is provided on this After Visit Summary.  MyChart is used to connect with patients for Virtual Visits (Telemedicine).  Patients are able to view lab/test results, encounter notes, upcoming appointments, etc.  Non-urgent messages can be sent to your provider as well.   To learn more about what you can do with MyChart, go to [email or web address].    Your next appointment:   JILL WILL CALL YOU WITH A FOLLOW-UP AFTER LAB RESULTS

## 2022-09-07 LAB — BASIC METABOLIC PANEL
BUN/Creatinine Ratio: 14 (ref 10–24)
BUN: 21 mg/dL (ref 8–27)
CO2: 27 mmol/L (ref 20–29)
Calcium: 9.6 mg/dL (ref 8.6–10.2)
Chloride: 101 mmol/L (ref 96–106)
Creatinine, Ser: 1.48 mg/dL — ABNORMAL HIGH (ref 0.76–1.27)
Glucose: 88 mg/dL (ref 70–99)
Potassium: 4.1 mmol/L (ref 3.5–5.2)
Sodium: 145 mmol/L — ABNORMAL HIGH (ref 134–144)
eGFR: 51 mL/min/{1.73_m2} — ABNORMAL LOW (ref >59)

## 2022-09-07 MED ORDER — TORSEMIDE 20 MG PO TABS: 20.0000 mg | ORAL_TABLET | Freq: Every day | ORAL | 3 refills | Status: DC

## 2022-09-08 ENCOUNTER — Other Ambulatory Visit: Payer: Self-pay | Admitting: Family Medicine

## 2022-09-13 DIAGNOSIS — Z79891 Long term (current) use of opiate analgesic: Secondary | ICD-10-CM | POA: Diagnosis not present

## 2022-09-13 DIAGNOSIS — M542 Cervicalgia: Secondary | ICD-10-CM | POA: Diagnosis not present

## 2022-09-13 DIAGNOSIS — M5451 Vertebrogenic low back pain: Secondary | ICD-10-CM | POA: Diagnosis not present

## 2022-09-13 DIAGNOSIS — M25569 Pain in unspecified knee: Secondary | ICD-10-CM | POA: Diagnosis not present

## 2022-09-13 DIAGNOSIS — M4322 Fusion of spine, cervical region: Secondary | ICD-10-CM | POA: Diagnosis not present

## 2022-09-13 DIAGNOSIS — G894 Chronic pain syndrome: Secondary | ICD-10-CM | POA: Diagnosis not present

## 2022-09-13 DIAGNOSIS — M961 Postlaminectomy syndrome, not elsewhere classified: Secondary | ICD-10-CM | POA: Diagnosis not present

## 2022-09-14 NOTE — Progress Notes (Unsigned)
[facility] VASC[facility]ENTER   MULTIDISCIPLINARY HEART VALVE CLINIC                                     Cardiology Office Note:    Date:  09/15/2022   ID:  Thomas [name], DOB 09/03/1955, MRN [id number]  PCP:  Thomas Dark, MD  [facility] Cardiologist:  Kristeen Miss, MD / Dr. Clifton James, MD (TAVR)  [facility] Electrophysiologist:  None   Referring MD: Thomas Dark, MD   Chief Complaint  Patient presents with   Follow-up   History of Present Illness:    Thomas [name] is a 67 y.o. male with a hx of DMT2, HTN, HLD, CKD stage IIIa, prior CVA, cervical myelopathy with cord compression s/p spinal surgery (8/23) and severe aortic stenosis who presented to [facility] on 07/13/22 for planned TAVR and is being seen today for follow up of LE edema and medication changes.    Thomas [name] is followed by Dr. Elease Hashimoto for his cardiology care. He was referred to the structural heart team initially in Nov 2023 after finding severe aortic stenosis on pre-operative echocardiogram prior to cervical spine surgery. Echo at that time showed an LVEF at 60-65%, mild LVH, trivial MR, and moderately severe to severe aortic valve stenosis with mean gradient of 39 mmHg, peak gradient 68.6 mmHg, AVA 0.82 cm2, DI 0.29, SVI 32. Given cord compression, his cervical surgery was felt to be urgent and he is now s/p cervical spine decompression 10/28/21 with anterior cervical discectomies. He was seen post operatively by Dr. Elease Hashimoto on 04/17/22 with symptoms of LE edema, chest pain and dyspnea. EKG showed atrial flutter with RVR. He was admitted to [facility] and treated with amiodarone and started on Eliquis. He underwent a TEE guided cardioversion at that time. Repeat echo 04/26/22 showed reduced LVEF at 35% with global hypokinesis, mild MR, severe low flow/low gradient AS with mean gradient 24 mmHg, peak gradient 39 mmHg, AVA 0.81 cm2, SVI 25, DI 0.33. Cardiac cath 04/27/22 with minimal non-obstructive CAD. Cardiac CTA 04/28/22 with aortic  valve calcium score of 5671. Valve area 476 mm2 suitable for a 26 mm Edwards Sapien 3 Ultra valve or 29 mm Medtrontic Evolut valve.    He was then evaluated by the multidisciplinary valve team and felt to have severe, symptomatic aortic stenosis and to be a suitable candidate and is now s/p successful TAVR with a 26 mm Edwards Sapien 3 THV via the TF approach on 07/13/22. Post operative echo with mildly improved LVEF at 45-50% with a mean gradient at 12 mmHg and peak gradient at 23 mmHG, DVI 0.56. EOAi 1.43 cm2/m2. He was restarted on Eliqius for PAF. Does not require dental SBE as he is edentulous. He was noted to have BLE on exam that day and was instructed to take his PRN Lasix to help with this.    At one month follow up, he was doing very well however had 2-3+ bilateral LE edema. He was only taking intermittent Lasix. He was seen again and edema appeared mildly improved at best with taking Lasix 40mg  QD. Labs showed stable Cr at 1.4 therefore he was transitioned to Torsemide given poor response with Lasix.   Today he is here with his wife. His edema is improved with Torsemide 20mg  every day although still 1+ at the least. His weight is down 3 lbs since he was last seen.  He denies chest pain, SOB, palpitations, LE edema, orthopnea, PND, dizziness, or syncope. Denies bleeding in stool or urine.    Past Medical History:  Diagnosis Date   Cervical myelopathy (HCC)    Diabetes mellitus without complication (HCC)    Hypercholesterolemia    Hypertension    S/P TAVR (transcatheter aortic valve replacement) 07/13/2022   26mm S3UR with Dr. Clifton James and Dr. Delia Chimes   Severe aortic stenosis    Stroke [employer]) 1982   pt states he had a mini stroke in 1982    Past Surgical History:  Procedure Laterality Date   ANTERIOR CERVICAL DECOMP/DISCECTOMY FUSION N/A 10/28/2021   Procedure: ACDF - C3-C4 - C4-C5 - C5-C6;  Surgeon: [name], MD;  [facility];  Service: Neurosurgery;  Laterality: N/A;   BACK SURGERY      CARDIOVERSION N/A 04/28/2022   Procedure: CARDIOVERSION;  Surgeon: Jodelle Red, MD;  Location: [facility] ENDOSCOPY;  Service: Cardiovascular;  Laterality: N/A;   CARDIOVERSION N/A 05/31/2022   Procedure: CARDIOVERSION;  Surgeon: Meriam Sprague, MD;  Location: [facility] ENDOSCOPY;  Service: Cardiovascular;  Laterality: N/A;   HERNIA REPAIR     INTRAOPERATIVE TRANSTHORACIC ECHOCARDIOGRAM N/A 07/13/2022   Procedure: INTRAOPERATIVE TRANSTHORACIC ECHOCARDIOGRAM;  Surgeon: Kathleene Hazel, MD;  Location: [facility] INVASIVE CV LAB;  Service: Open Heart Surgery;  Laterality: N/A;   RIGHT/LEFT HEART CATH AND CORONARY ANGIOGRAPHY N/A 04/27/2022   Procedure: RIGHT/LEFT HEART CATH AND CORONARY ANGIOGRAPHY;  Surgeon: Lennette Bihari, MD;  Location: [facility] INVASIVE CV LAB;  Service: Cardiovascular;  Laterality: N/A;   TEE WITHOUT CARDIOVERSION N/A 04/28/2022   Procedure: TRANSESOPHAGEAL ECHOCARDIOGRAM (TEE);  Surgeon: Jodelle Red, MD;  Location: [facility] ENDOSCOPY;  Service: Cardiovascular;  Laterality: N/A;   TRANSCATHETER AORTIC VALVE REPLACEMENT, TRANSFEMORAL N/A 07/13/2022   Procedure: Transcatheter Aortic Valve Replacement, Transfemoral;  Surgeon: Kathleene Hazel, MD;  Location: [facility] INVASIVE CV LAB;  Service: Open Heart Surgery;  Laterality: N/A;    Current Medications: Current Meds  Medication Sig   amiodarone (PACERONE) 200 MG tablet Take 1 tablet by mouth twice a day for 7 days then reduce to 1 tablet by mouth daily (Patient taking differently: Take 200 mg by mouth daily.)   apixaban (ELIQUIS) 5 MG TABS tablet Take 1 tablet (5 mg total) by mouth 2 (two) times daily.   Apple Cider Vinegar 500 MG TABS Take 500 mg by mouth at bedtime.   diclofenac sodium (VOLTAREN) 1 % GEL Apply 1 Application topically 3 (three) times daily as needed (pain).   gabapentin (NEURONTIN) 400 MG capsule Take 400 mg by mouth 3 (three) times daily.   Ginger, Zingiber officinalis, (GINGER PO) Take 500 mg by mouth  daily.   glimepiride (AMARYL) 4 MG tablet TAKE ONE TABLET ONCE DAILY WITH BREAKFAST   Lancets (ONETOUCH DELICA PLUS LANCET33G) MISC CHECK BLOOD SUGAR TWICE DAILY   levothyroxine (SYNTHROID) 150 MCG tablet TAKE ONE TABLET ONCE DAILY BEFORE BREAKFAST   metFORMIN (GLUCOPHAGE) 1000 MG tablet TAKE 1 TABLET (1,000 MG TOTAL) BY MOUTH TWICE A DAY WITH FOOD.   morphine (MSIR) 15 MG tablet Take 15 mg by mouth every 8 (eight) hours as needed for moderate pain.   ONETOUCH VERIO test strip CHECK BLOOD SUGAR UP TO THREE TIMES DAILY AS DIRECTED   potassium chloride SA (KLOR-CON M) 20 MEQ tablet Take 1 tablet (20 mEq total) by mouth daily.   rosuvastatin (CRESTOR) 10 MG tablet Take 1 tablet (10 mg total) by mouth daily. (Patient taking differently: Take 10 mg by  mouth at bedtime.)   torsemide (DEMADEX) 20 MG tablet Take 1 tablet (20 mg total) by mouth daily.   Turmeric (QC TUMERIC COMPLEX) 500 MG CAPS Take 500 mg by mouth at bedtime.     Allergies:   Trulicity [dulaglutide], Januvia [sitagliptin], Other, and Penicillins   Social History   Socioeconomic History   Marital status: Married    Spouse name: Not on file   Number of children: 3   Years of education: Not on file   Highest education level: Not on file  Occupational History   Occupation: Retired from Designer, fashion/clothing, also drove a dump truck  Tobacco Use   Smoking status: Never   Smokeless tobacco: Never  Building services engineer Use: Never used  Substance and Sexual Activity   Alcohol use: Never    Comment: occ beer    Drug use: No   Sexual activity: Not on file  Other Topics Concern   Not on file  Social History Narrative   Not on file   Social Determinants of Health   Financial Resource Strain: Low Risk  (03/04/2022)   Overall Financial Resource Strain (CARDIA)    Difficulty of Paying Living Expenses: Not hard at all  Food Insecurity: No Food Insecurity (07/13/2022)   Hunger Vital Sign    Worried About Running Out of Food in the Last Year:  Never true    Ran Out of Food in the Last Year: Never true  Transportation Needs: No Transportation Needs (07/13/2022)   PRAPARE - Administrator, Civil Service (Medical): No    Lack of Transportation (Non-Medical): No  Physical Activity: Insufficiently Active (03/04/2022)   Exercise Vital Sign    Days of Exercise per Week: 7 days    Minutes of Exercise per Session: 20 min  Stress: No Stress Concern Present (03/04/2022)   [employer] of Occupational Health - Occupational Stress Questionnaire    Feeling of Stress : Not at all  Social Connections: Moderately Isolated (03/04/2022)   Social Connection and Isolation Panel [NHANES]    Frequency of Communication with Friends and Family: Once a week    Frequency of Social Gatherings with Friends and Family: More than three times a week    Attends Religious Services: Never    Database administrator or Organizations: No    Attends Engineer, structural: Never    Marital Status: Married     Family History: The patient's family history includes Arthritis in his father and mother; Breast cancer in his sister; Diabetes in his mother; Heart attack in his mother; Hypertension in his mother; Skin cancer in his father.  ROS:   Please see the history of present illness.    All other systems reviewed and are negative.  EKGs/Labs/Other Studies Reviewed:    The following studies were reviewed today:  Echocardiogram 08/18/22:   1. Left ventricular ejection fraction, by estimation, is 50 to 55%. The  left ventricle has low normal function. The left ventricle has no regional  wall motion abnormalities. There is mild left ventricular hypertrophy.  Left ventricular diastolic  parameters are indeterminate.   2. Right ventricular systolic function is normal. The right ventricular  size is normal. Tricuspid regurgitation signal is inadequate for assessing  PA pressure.   3. The mitral valve is normal in structure. No evidence of  mitral valve  regurgitation. No evidence of mitral stenosis.   4. The aortic valve has been repaired/replaced. Aortic valve  regurgitation is trivial. There  is a 26 mm Edwards Sapien prosthetic  (TAVR) valve present in the aortic position. Aortic valve mean gradient  measures 14.0 mmHg. Vmax 2.5 m/s, MG 14 mmHg, EOA  2.0 cm^2, DI 0.40. Trivial PVL   5. The inferior vena cava is normal in size with greater than 50%  respiratory variability, suggesting right atrial pressure of 3 mmHg.      HEART AND VASCULAR CENTER  TAVR OPERATIVE NOTE     Date of Procedure:                07/13/2022   Preoperative Diagnosis:      Severe Aortic Stenosis    Postoperative Diagnosis:    Same    Procedure:        Transcatheter Aortic Valve Replacement - Transfemoral Approach             Edwards Sapien 3 THV (size 26 mm, model # [id number], serial # [id number] )              Co-Surgeons:                        Verne Carrow, MD and Clare Charon, MD    Anesthesiologist:                  Maple Hudson   Echocardiographer:              Izora Ribas   Pre-operative Echo Findings: Severe aortic stenosis Normal left ventricular systolic function   Post-operative Echo Findings: Trivial paravalvular leak Normal left ventricular systolic function ____________   Echo 07/14/22:   1. The aortic valve has been replaced by a 26 mm Sapien Valve. Aortic  valve regurgitation is not visualized.Mean gradient 12 mm Hg, Peak  gradient 23 mm HG, DVI 0.56. EOAi 1.43 cm2/m2.   2. Left ventricular ejection fraction, by estimation, is 45 to 50%. The  left ventricle has mildly decreased function. The left ventricle has no  regional wall motion abnormalities. There is moderate concentric left  ventricular hypertrophy. Left  ventricular diastolic parameters are consistent with Grade II diastolic  dysfunction (pseudonormalization).   3. Right ventricular systolic function is normal. The right ventricular  size is mildly  enlarged.   4. The mitral valve is grossly normal. No evidence of mitral valve  regurgitation. No evidence of mitral stenosis. The mean mitral valve  gradient is 2.5 mmHg.   5. Aortic dilatation noted. There is mild dilatation of the ascending  aorta, measuring 40 mm.   6. The inferior vena cava is normal in size with greater than 50%  respiratory variability, suggesting right atrial pressure of 3 mmHg.   Comparison(s): Prior images reviewed side by side. LV appears more  vigorous. PVL no longer seen.   EKG:  EKG is not ordered today.   Recent Labs: 04/27/2022: TSH 0.361 07/09/2022: ALT 27 07/14/2022: Magnesium 1.6 07/19/2022: Hemoglobin 13.3; Platelets 206 09/06/2022: BUN 21; Creatinine, Ser 1.48; Potassium 4.1; Sodium 145   Recent Lipid Panel    Component Value Date/Time   CHOL 91 04/27/2022 0334   TRIG 77 04/27/2022 0334   HDL 30 (L) 04/27/2022 0334   CHOLHDL 3.0 04/27/2022 0334   VLDL 15 04/27/2022 0334   LDLCALC 46 04/27/2022 0334   LDLDIRECT 76.0 10/09/2020 0840   Physical Exam:    VS:  BP 112/62   Pulse 64   Ht 6' (1.829 m)   Wt 218 lb 3.2 oz (99 kg)   SpO2  96%   BMI 29.59 kg/m     Wt Readings from Last 3 Encounters:  09/15/22 218 lb 3.2 oz (99 kg)  09/06/22 221 lb 12.8 oz (100.6 kg)  07/19/22 224 lb 12.8 oz (102 kg)    General: Well developed, well nourished, NAD Lungs:Clear to ausculation bilaterally. No wheezes, rales, or rhonchi. Breathing is unlabored. Cardiovascular: RRR with S1 S2. No murmurs Extremities: 1-2+ BLE edema.  Neuro: Alert and oriented. No focal deficits. No facial asymmetry. MAE spontaneously. Psych: Responds to questions appropriately with normal affect.    ASSESSMENT/PLAN:    Severe AS: Patient doing well with NYHA class I symptoms s/p successful TAVR with a 26 mm Edwards Sapien 3 THV via the TF approach on 07/13/22. One month echo with LVEF at 50-55% with a mean gradient at , peak at 25.72mmHg, AVA by 1.79cm2, DI 0.40 and with  trivial PVL. Continue Eliqius monotherapy. Does not require dental SBE as he is edentulous.    Chronic diastolic HF: Continues to have 1-2+ BLE however improved on Torsemide. Plan for BMET today and if Cr stable will attempt to increase to 40mg  every day (or even alternating between 20mg  and 40mg  every other day) depending on lab work. If increasing dose, will plan follow up in 2-3 weeks.    CKD stage IIIa: Baseline Cr appears to be in the 1.5-1.6 range.    HTN: Stable with no changes    HLD: Continue Crestor   Prior CVA: No new neuro changes.    Cervical myelopathy with cord compression s/p spinal surgery (8/23): Deferred CRII    PAF s/p DCCV: Currenlty maintaining SB. Continue Eliquis and Amiodarone. No BB given baseline bradycardia.    PVC/PACs: Denies symptoms.    Incidental findings: Circumferential wall thickening of the distal right colon, possibly due to decompression, although neoplasm could have a similar appearance. Recommend further evaluation with colonoscopy. Discussed with patient and family. He has never had a colonoscopy. High encouraged to undergo colonoscopy given the above and no prior testing. Encouraged to move follow up from 12/2022 closer. Denies bleeding in stool and no recent change in bowel habits.    Medication Adjustments/Labs and Tests Ordered: Current medicines are reviewed at length with the patient today.  Concerns regarding medicines are outlined above.  Orders Placed This Encounter  Procedures   Basic metabolic panel   No orders of the defined types were placed in this encounter.   Patient Instructions  Medication Instructions:  Your physician recommends that you continue on your current medications as directed. Please refer to the Current Medication list given to you today.  *If you need a refill on your cardiac medications before your next appointment, please call your pharmacy*   Lab Work: TODAY: BMET If you have labs (blood work) drawn  today and your tests are completely normal, you will receive your results only by: MyChart Message (if you have MyChart) OR A paper copy in the mail If you have any lab test that is abnormal or we need to change your treatment, we will call you to review the results.   Testing/Procedures: NONE   Follow-Up: At [facility], you and your health needs are our priority.  As part of our continuing mission to provide you with exceptional heart care, we have created designated Provider Care Teams.  These Care Teams include your primary Cardiologist (physician) and Advanced Practice Providers (APPs -  Physician Assistants and Nurse Practitioners) who all work together to provide you with the  care you need, when you need it.  We recommend signing up for the patient portal called "MyChart".  Sign up information is provided on this After Visit Summary.  MyChart is used to connect with patients for Virtual Visits (Telemedicine).  Patients are able to view lab/test results, encounter notes, upcoming appointments, etc.  Non-urgent messages can be sent to your provider as well.   To learn more about what you can do with MyChart, go to [email or web address].    Your next appointment:   BASED ON LAB RESULTS    Signed, Georgie Chard, NP  09/15/2022 1:54 PM    [facility]

## 2022-09-15 ENCOUNTER — Ambulatory Visit: Payer: Medicare Other | Attending: Internal Medicine | Admitting: Cardiology

## 2022-09-15 VITALS — BP 112/62 | HR 64 | Ht 72.0 in | Wt 218.2 lb

## 2022-09-15 DIAGNOSIS — E1122 Type 2 diabetes mellitus with diabetic chronic kidney disease: Secondary | ICD-10-CM

## 2022-09-15 DIAGNOSIS — I5042 Chronic combined systolic (congestive) and diastolic (congestive) heart failure: Secondary | ICD-10-CM | POA: Diagnosis not present

## 2022-09-15 DIAGNOSIS — R609 Edema, unspecified: Secondary | ICD-10-CM

## 2022-09-15 DIAGNOSIS — I350 Nonrheumatic aortic (valve) stenosis: Secondary | ICD-10-CM | POA: Diagnosis not present

## 2022-09-15 DIAGNOSIS — Z79899 Other long term (current) drug therapy: Secondary | ICD-10-CM

## 2022-09-15 DIAGNOSIS — N183 Chronic kidney disease, stage 3 unspecified: Secondary | ICD-10-CM | POA: Diagnosis not present

## 2022-09-15 DIAGNOSIS — Z952 Presence of prosthetic heart valve: Secondary | ICD-10-CM

## 2022-09-15 DIAGNOSIS — Z7984 Long term (current) use of oral hypoglycemic drugs: Secondary | ICD-10-CM | POA: Diagnosis not present

## 2022-09-15 LAB — BASIC METABOLIC PANEL
BUN/Creatinine Ratio: 18 (ref 10–24)
BUN: 29 mg/dL — ABNORMAL HIGH (ref 8–27)
CO2: 28 mmol/L (ref 20–29)
Calcium: 9.3 mg/dL (ref 8.6–10.2)
Chloride: 98 mmol/L (ref 96–106)
Creatinine, Ser: 1.63 mg/dL — ABNORMAL HIGH (ref 0.76–1.27)
Glucose: 138 mg/dL — ABNORMAL HIGH (ref 70–99)
Potassium: 3.7 mmol/L (ref 3.5–5.2)
Sodium: 141 mmol/L (ref 134–144)
eGFR: 46 mL/min/{1.73_m2} — ABNORMAL LOW (ref >59)

## 2022-09-15 NOTE — Patient Instructions (Signed)
Medication Instructions:  Your physician recommends that you continue on your current medications as directed. Please refer to the Current Medication list given to you today.  *If you need a refill on your cardiac medications before your next appointment, please call your pharmacy*   Lab Work: TODAY: BMET If you have labs (blood work) drawn today and your tests are completely normal, you will receive your results only by: MyChart Message (if you have MyChart) OR A paper copy in the mail If you have any lab test that is abnormal or we need to change your treatment, we will call you to review the results.   Testing/Procedures: NONE   Follow-Up: At [facility], you and your health needs are our priority.  As part of our continuing mission to provide you with exceptional heart care, we have created designated Provider Care Teams.  These Care Teams include your primary Cardiologist (physician) and Advanced Practice Providers (APPs -  Physician Assistants and Nurse Practitioners) who all work together to provide you with the care you need, when you need it.  We recommend signing up for the patient portal called "MyChart".  Sign up information is provided on this After Visit Summary.  MyChart is used to connect with patients for Virtual Visits (Telemedicine).  Patients are able to view lab/test results, encounter notes, upcoming appointments, etc.  Non-urgent messages can be sent to your provider as well.   To learn more about what you can do with MyChart, go to [email or web address].    Your next appointment:   BASED ON LAB RESULTS

## 2022-09-20 ENCOUNTER — Ambulatory Visit: Payer: Medicare Other

## 2022-10-07 ENCOUNTER — Other Ambulatory Visit: Payer: Self-pay | Admitting: Family Medicine

## 2022-10-11 DIAGNOSIS — G894 Chronic pain syndrome: Secondary | ICD-10-CM | POA: Diagnosis not present

## 2022-10-11 DIAGNOSIS — Z79891 Long term (current) use of opiate analgesic: Secondary | ICD-10-CM | POA: Diagnosis not present

## 2022-10-11 DIAGNOSIS — M961 Postlaminectomy syndrome, not elsewhere classified: Secondary | ICD-10-CM | POA: Diagnosis not present

## 2022-10-11 DIAGNOSIS — M542 Cervicalgia: Secondary | ICD-10-CM | POA: Diagnosis not present

## 2022-10-11 DIAGNOSIS — Z79899 Other long term (current) drug therapy: Secondary | ICD-10-CM | POA: Diagnosis not present

## 2022-10-11 DIAGNOSIS — M4322 Fusion of spine, cervical region: Secondary | ICD-10-CM | POA: Diagnosis not present

## 2022-10-11 DIAGNOSIS — M5451 Vertebrogenic low back pain: Secondary | ICD-10-CM | POA: Diagnosis not present

## 2022-10-11 DIAGNOSIS — M25569 Pain in unspecified knee: Secondary | ICD-10-CM | POA: Diagnosis not present

## 2022-10-14 NOTE — Progress Notes (Unsigned)
[facility]   MULTIDISCIPLINARY HEART VALVE CLINIC                                     Cardiology Office Note:    Date:  10/19/2022   ID:  Thomas [name], DOB 16-Jun-1955, MRN [id number]  PCP:  Thomas Dark, MD  [facility] Cardiologist:  Thomas Miss, MD / Dr. Clifton James, MD (TAVR)  [facility] Electrophysiologist:  None   Referring MD: Thomas Dark, MD   Chief Complaint  Patient presents with   Follow-up   History of Present Illness:    Thomas [name] is a 67 y.o. male with a hx of DMT2, HTN, HLD, CKD stage IIIa, prior CVA, cervical myelopathy with cord compression s/p spinal surgery (8/23) and severe aortic stenosis s/p TAVR who is being seen today for follow up of LE edema.    Thomas [name] is followed by Thomas [name] for his cardiology care. He was referred to the structural heart team initially in Nov 2023 after finding severe aortic stenosis on pre-operative echocardiogram prior to cervical spine surgery. Echo at that time showed an LVEF at 60-65%, mild LVH, trivial MR, and moderately severe to severe aortic valve stenosis with mean gradient of 39 mmHg, peak gradient 68.6 mmHg, AVA 0.82 cm2, DI 0.29, SVI 32. Given cord compression, his cervical surgery was felt to be urgent and he is now s/p cervical spine decompression 10/28/21 with anterior cervical discectomies. He was seen post operatively by Thomas [name] on 04/17/22 with symptoms of LE edema, chest pain and dyspnea. EKG showed atrial flutter with RVR. He was admitted to [facility] and treated with amiodarone and started on Eliquis. He underwent a TEE guided cardioversion at that time. Repeat echo 04/26/22 showed reduced LVEF at 35% with global hypokinesis, mild MR, severe low flow/low gradient AS with mean gradient 24 mmHg, peak gradient 39 mmHg, AVA 0.81 cm2, SVI 25, DI 0.33. Cardiac cath 04/27/22 with minimal non-obstructive CAD. Cardiac CTA 04/28/22 with aortic valve calcium score of 5671. Valve area 476 mm2 suitable for a  26 mm Edwards Sapien 3 Ultra valve or 29 mm Medtrontic Evolut valve.    He was then evaluated by the multidisciplinary valve team and felt to have severe, symptomatic aortic stenosis and to be a suitable candidate and is now s/p successful TAVR with a 26 mm Edwards Sapien 3 THV via the TF approach on 07/13/22. Post operative echo with mildly improved LVEF at 45-50% with a mean gradient at 12 mmHg and peak gradient at 23 mmHG, DVI 0.56. EOAi 1.43 cm2/m2. He was restarted on Eliqius for PAF. Does not require dental SBE as he is edentulous. He was noted to have BLE on exam that day and was instructed to take his PRN Lasix to help with this.    At one month follow up, he was doing very well however had 2-3+ bilateral LE edema. He was only taking intermittent Lasix. Lasix was changed to daily dosing with minimal improvement and transitioned to Torsemide.   In subsequent follow up, edema was somewhat improved however Cr also increased slightly above baseline. Today he continues to do well with no chest pain, SOB, palpitations, or orthopnea however legs continue to be edematous. Weight up some from last visit. Labs today show Cr above baseline at 1.7 (baseline at 1.4-1.5). Continues to walk for exercise daily with no issues.  Past Medical History:  Diagnosis Date   Cervical myelopathy (HCC)    Diabetes mellitus without complication (HCC)    Hypercholesterolemia    Hypertension    S/P TAVR (transcatheter aortic valve replacement) 07/13/2022   26mm S3UR with Thomas [name] and Thomas [name]   Severe aortic stenosis    Stroke [facility]) 1982   pt states he had a mini stroke in 1982    Past Surgical History:  Procedure Laterality Date   ANTERIOR CERVICAL DECOMP/DISCECTOMY FUSION N/A 10/28/2021   Procedure: ACDF - C3-C4 - C4-C5 - C5-C6;  Surgeon: Thomas Citrin, MD;  Location: [facility] OR;  Service: Neurosurgery;  Laterality: N/A;   BACK SURGERY     CARDIOVERSION N/A 04/28/2022   Procedure: CARDIOVERSION;  Surgeon:  Thomas Red, MD;  Location: [facility] ENDOSCOPY;  Service: Cardiovascular;  Laterality: N/A;   CARDIOVERSION N/A 05/31/2022   Procedure: CARDIOVERSION;  Surgeon: Thomas Sprague, MD;  Location: [facility] ENDOSCOPY;  Service: Cardiovascular;  Laterality: N/A;   HERNIA REPAIR     INTRAOPERATIVE TRANSTHORACIC ECHOCARDIOGRAM N/A 07/13/2022   Procedure: INTRAOPERATIVE TRANSTHORACIC ECHOCARDIOGRAM;  Surgeon: Thomas Hazel, MD;  Location: [facility] INVASIVE CV LAB;  Service: Open Heart Surgery;  Laterality: N/A;   RIGHT/LEFT HEART CATH AND CORONARY ANGIOGRAPHY N/A 04/27/2022   Procedure: RIGHT/LEFT HEART CATH AND CORONARY ANGIOGRAPHY;  Surgeon: Thomas Bihari, MD;  Location: [facility] INVASIVE CV LAB;  Service: Cardiovascular;  Laterality: N/A;   TEE WITHOUT CARDIOVERSION N/A 04/28/2022   Procedure: TRANSESOPHAGEAL ECHOCARDIOGRAM (TEE);  Surgeon: Thomas Red, MD;  Location: [facility] ENDOSCOPY;  Service: Cardiovascular;  Laterality: N/A;   TRANSCATHETER AORTIC VALVE REPLACEMENT, TRANSFEMORAL N/A 07/13/2022   Procedure: Transcatheter Aortic Valve Replacement, Transfemoral;  Surgeon: Thomas Hazel, MD;  Location: [facility] INVASIVE CV LAB;  Service: Open Heart Surgery;  Laterality: N/A;    Current Medications: Current Meds  Medication Sig   amiodarone (PACERONE) 200 MG tablet Take 200 mg by mouth daily.   apixaban (ELIQUIS) 5 MG TABS tablet Take 1 tablet (5 mg total) by mouth 2 (two) times daily.   Apple Cider Vinegar 500 MG TABS Take 500 mg by mouth at bedtime.   diclofenac sodium (VOLTAREN) 1 % GEL Apply 1 Application topically 3 (three) times daily as needed (pain).   gabapentin (NEURONTIN) 400 MG capsule Take 400 mg by mouth 3 (three) times daily.   Ginger, Zingiber officinalis, (GINGER PO) Take 500 mg by mouth daily.   glimepiride (AMARYL) 4 MG tablet TAKE ONE TABLET ONCE DAILY WITH BREAKFAST   Lancets (ONETOUCH DELICA PLUS LANCET33G) MISC CHECK BLOOD SUGAR TWICE DAILY   levothyroxine  (SYNTHROID) 150 MCG tablet TAKE ONE TABLET ONCE DAILY BEFORE BREAKFAST   metFORMIN (GLUCOPHAGE) 1000 MG tablet TAKE 1 TABLET (1,000 MG TOTAL) BY MOUTH TWICE A DAY WITH FOOD.   morphine (MSIR) 15 MG tablet Take 15 mg by mouth every 8 (eight) hours as needed for moderate pain.   ONETOUCH VERIO test strip CHECK BLOOD SUGAR UP TO THREE TIMES DAILY AS DIRECTED   potassium chloride SA (KLOR-CON M) 20 MEQ tablet Take 1 tablet (20 mEq total) by mouth daily.   rosuvastatin (CRESTOR) 10 MG tablet Take 1 tablet (10 mg total) by mouth daily.   torsemide (DEMADEX) 20 MG tablet Take 1 tablet (20 mg total) by mouth daily.   Turmeric (QC TUMERIC COMPLEX) 500 MG CAPS Take 500 mg by mouth at bedtime.     Allergies:   Trulicity [dulaglutide], Januvia [sitagliptin], Other, and Penicillins   Social History  Socioeconomic History   Marital status: Married    Spouse name: Not on file   Number of children: 3   Years of education: Not on file   Highest education level: Not on file  Occupational History   Occupation: Retired from Designer, fashion/clothing, also drove a dump truck  Tobacco Use   Smoking status: Never   Smokeless tobacco: Never  Vaping Use   Vaping status: Never Used  Substance and Sexual Activity   Alcohol use: Never    Comment: occ beer    Drug use: No   Sexual activity: Not on file  Other Topics Concern   Not on file  Social History Narrative   Not on file   Social Determinants of Health   Financial Resource Strain: Low Risk  (03/04/2022)   Overall Financial Resource Strain (CARDIA)    Difficulty of Paying Living Expenses: Not hard at all  Food Insecurity: No Food Insecurity (07/13/2022)   Hunger Vital Sign    Worried About Running Out of Food in the Last Year: Never true    Ran Out of Food in the Last Year: Never true  Transportation Needs: No Transportation Needs (07/13/2022)   PRAPARE - Administrator, Civil Service (Medical): No    Lack of Transportation (Non-Medical): No   Physical Activity: Insufficiently Active (03/04/2022)   Exercise Vital Sign    Days of Exercise per Week: 7 days    Minutes of Exercise per Session: 20 min  Stress: No Stress Concern Present (03/04/2022)   [employer] of Occupational Health - Occupational Stress Questionnaire    Feeling of Stress : Not at all  Social Connections: Moderately Isolated (03/04/2022)   Social Connection and Isolation Panel [NHANES]    Frequency of Communication with Friends and Family: Once a week    Frequency of Social Gatherings with Friends and Family: More than three times a week    Attends Religious Services: Never    Database administrator or Organizations: No    Attends Engineer, structural: Never    Marital Status: Married    Family History: The patient's family history includes Arthritis in his father and mother; Breast cancer in his sister; Diabetes in his mother; Heart attack in his mother; Hypertension in his mother; Skin cancer in his father.  ROS:   Please see the history of present illness.    All other systems reviewed and are negative.  EKGs/Labs/Other Studies Reviewed:    The following studies were reviewed today:  Cardiac Studies & Procedures   CARDIAC CATHETERIZATION  CARDIAC CATHETERIZATION 04/27/2022  Narrative   Mid RCA lesion is 20% stenosed.   Mid LM lesion is 10% stenosed.   Prox LAD lesion is 20% stenosed.   Mid Cx lesion is 20% stenosed.   There is severe aortic valve stenosis.  Mild coronary calcification with nonobstructive disease with smooth 10% distal left main stenosis, 20% proximal LAD stenosis, 20% AV groove circumflex stenosis, and 20% RCA stenosis.  Moderate right heart pressure elevation with moderate pulmonary hypertension with mean PA pressure 35 mmHg.  Severe aortic stenosis with a mean gradient 28.2, peak to peak gradient 33 mmHg, and calculated AVA 0.9 cm.  RECOMMENDATION: Patient will be treated with anticoagulation with his atrial  flutter/fibrillation with probable attempted cardioversion. Ultimately will need structural heart team evaluation for aortic valve replacement.  Findings Coronary Findings Diagnostic  Dominance: Right  Left Main Mid LM lesion is 10% stenosed.  Left Anterior Descending Prox LAD  lesion is 20% stenosed.  Left Circumflex Mid Cx lesion is 20% stenosed.  Right Coronary Artery Mid RCA lesion is 20% stenosed.  Intervention  No interventions have been documented.     ECHOCARDIOGRAM  ECHOCARDIOGRAM COMPLETE 08/18/2022  Narrative ECHOCARDIOGRAM REPORT    Patient Name:   MALIEK SCHELLHORN Date of Exam: 08/18/2022 Medical Rec #:  [id number]       Height:       72.0 in Accession #:    [id number]      Weight:       224.8 lb Date of Birth:  1956/02/09       BSA:          2.239 m Patient Age:    67 years        BP:           133/66 mmHg Patient Gender: M               HR:           55 bpm. Exam Location:  [address]  Procedure: 2D Echo, 3D Echo, Cardiac Doppler and Color Doppler  Indications:    Z95.2 Status post TAVR  History:        Patient has prior history of Echocardiogram examinations, most recent 07/14/2022. Stroke, Aortic Valve Disease; Risk Factors:Family History of Coronary Artery Disease, Hypertension, Dyslipidemia and Diabetes. Aortic Stenosis status post TAVR- 07-13-22, 26mm Edwards S3UR. Aortic Valve: 26 mm Edwards Sapien prosthetic, stented (TAVR) valve is present in the aortic position.  Sonographer:    Farrel Conners RDCS Referring Phys: Deri Fuelling Kavya Haag  IMPRESSIONS   1. Left ventricular ejection fraction, by estimation, is 50 to 55%. The left ventricle has low normal function. The left ventricle has no regional wall motion abnormalities. There is mild left ventricular hypertrophy. Left ventricular diastolic parameters are indeterminate. 2. Right ventricular systolic function is normal. The right ventricular size is normal. Tricuspid regurgitation signal is  inadequate for assessing PA pressure. 3. The mitral valve is normal in structure. No evidence of mitral valve regurgitation. No evidence of mitral stenosis. 4. The aortic valve has been repaired/replaced. Aortic valve regurgitation is trivial. There is a 26 mm Edwards Sapien prosthetic (TAVR) valve present in the aortic position. Aortic valve mean gradient measures 14.0 mmHg. Vmax 2.5 m/s, MG 14 mmHg, EOA 2.0 cm^2, DI 0.40. Trivial PVL 5. The inferior vena cava is normal in size with greater than 50% respiratory variability, suggesting right atrial pressure of 3 mmHg.  FINDINGS Left Ventricle: Left ventricular ejection fraction, by estimation, is 50 to 55%. The left ventricle has low normal function. The left ventricle has no regional wall motion abnormalities. The left ventricular internal cavity size was normal in size. There is mild left ventricular hypertrophy. Left ventricular diastolic parameters are indeterminate.  Right Ventricle: The right ventricular size is normal. No increase in right ventricular wall thickness. Right ventricular systolic function is normal. Tricuspid regurgitation signal is inadequate for assessing PA pressure.  Left Atrium: Left atrial size was normal in size.  Right Atrium: Right atrial size was normal in size.  Pericardium: There is no evidence of pericardial effusion.  Mitral Valve: The mitral valve is normal in structure. No evidence of mitral valve regurgitation. No evidence of mitral valve stenosis.  Tricuspid Valve: The tricuspid valve is normal in structure. Tricuspid valve regurgitation is trivial.  Aortic Valve: The aortic valve has been repaired/replaced. Aortic valve regurgitation is trivial. Aortic regurgitation PHT measures 346 msec. Aortic valve  mean gradient measures 14.0 mmHg. Aortic valve peak gradient measures 25.6 mmHg. Aortic valve area, by VTI measures 1.79 cm. There is a 26 mm Edwards Sapien prosthetic, stented (TAVR) valve present in the  aortic position.  Pulmonic Valve: The pulmonic valve was not well visualized. Pulmonic valve regurgitation is trivial.  Aorta: The aortic root is normal in size and structure.  Venous: The inferior vena cava is normal in size with greater than 50% respiratory variability, suggesting right atrial pressure of 3 mmHg.  IAS/Shunts: The interatrial septum was not well visualized.   LEFT VENTRICLE PLAX 2D LVIDd:         5.30 cm   Diastology LVIDs:         3.40 cm   LV e' medial:    6.31 cm/s LV PW:         1.00 cm   LV E/e' medial:  19.4 LV IVS:        1.30 cm   LV e' lateral:   9.57 cm/s LVOT diam:     2.30 cm   LV E/e' lateral: 12.8 LV SV:         110 LV SV Index:   49 LVOT Area:     4.15 cm  3D Volume EF: 3D EF:        58 % LV EDV:       145 ml LV ESV:       61 ml LV SV:        84 ml  RIGHT VENTRICLE RV Basal diam:  4.10 cm RV Mid diam:    3.60 cm RV S prime:     11.70 cm/s TAPSE (M-mode): 2.6 cm  LEFT ATRIUM             Index        RIGHT ATRIUM           Index LA diam:        4.60 cm 2.05 cm/m   RA Pressure: 3.00 mmHg LA Vol (A2C):   66.3 ml 29.61 ml/m  RA Area:     16.00 cm LA Vol (A4C):   69.1 ml 30.86 ml/m  RA Volume:   44.50 ml  19.87 ml/m LA Biplane Vol: 68.8 ml 30.73 ml/m AORTIC VALVE AV Area (Vmax):    1.75 cm AV Area (Vmean):   1.71 cm AV Area (VTI):     1.79 cm AV Vmax:           253.00 cm/s AV Vmean:          170.667 cm/s AV VTI:            0.615 m AV Peak Grad:      25.6 mmHg AV Mean Grad:      14.0 mmHg LVOT Vmax:         106.32 cm/s LVOT Vmean:        70.225 cm/s LVOT VTI:          0.265 m LVOT/AV VTI ratio: 0.43 AI PHT:            346 msec  AORTA Ao Root diam: 3.80 cm Ao Asc diam:  3.80 cm  MITRAL VALVE                TRICUSPID VALVE MV Area (PHT): cm          Estimated RAP:  3.00 mmHg MV Decel Time: 374 msec MV E velocity: 122.50 cm/s  SHUNTS MV A velocity: 122.50 cm/s  Systemic VTI:  0.26 m MV E/A ratio:  1.00         Systemic  Diam: 2.30 cm  Epifanio Lesches MD Electronically signed by Epifanio Lesches MD Signature Date/Time: 08/18/2022/1:34:30 PM    Final   TEE  ECHO TEE 04/28/2022  Narrative TRANSESOPHOGEAL ECHO REPORT    Patient Name:   JUANYA VILLAVICENCIO Date of Exam: 04/28/2022 Medical Rec #:  [id number]       Height:       72.0 in Accession #:    [id number]      Weight:       222.0 lb Date of Birth:  [birth year]       BSA:          2.227 m Patient Age:    66 years        BP:           98/59 mmHg Patient Gender: M               HR:           108 bpm. Exam Location:  Inpatient  Procedure: Cardiac Doppler, Color Doppler, Transesophageal Echo and 3D Echo  Indications:    A-flutter/AS  History:        Patient has prior history of Echocardiogram examinations, most recent 04/26/2022. CHF, Stroke, Aortic stenosis; Risk Factors:Diabetes, Hypertension and Dyslipidemia. CKD.  Sonographer:    Milda Smart Referring Phys: Thomas [name]  PROCEDURE: After discussion of the risks and benefits of a TEE, an informed consent was obtained from the patient. TEE procedure time was 28 minutes. The transesophogeal probe was passed without difficulty through the esophogus of the patient. Imaged were obtained with the patient in a left lateral decubitus position. Local oropharyngeal anesthetic was provided with viscous lidocaine. Sedation performed by different physician. The patient was monitored while under deep sedation. Anesthestetic sedation was provided intravenously by Anesthesiology: 223.5mg  of Propofol. Image quality was good. The patient's vital signs; including heart rate, blood pressure, and oxygen saturation; remained stable throughout the procedure. The patient developed no complications during the procedure. A successful direct current cardioversion was performed at 120 joules with 2 attempts. Cardioverted first attempt at 120 J (unsuccessful), then 150 J second attempt  (successful).  IMPRESSIONS   1. Left ventricular ejection fraction, by estimation, is 35 to 40%. The left ventricle has moderately decreased function. The left ventricle demonstrates global hypokinesis. The left ventricular internal cavity size was mildly dilated. 2. Right ventricular systolic function is normal. The right ventricular size is normal. 3. Left atrial size was mild to moderately dilated. No left atrial/left atrial appendage thrombus was detected. The LAA emptying velocity was 79 cm/s. 4. Right atrial size was mildly dilated. 5. The mitral valve is normal in structure. Mild mitral valve regurgitation. No evidence of mitral stenosis. 6. The aortic valve is tricuspid. There is severe calcifcation of the aortic valve. There is moderate thickening of the aortic valve. Aortic valve regurgitation is moderate. Low flow low gradient severe AS. Aortic valve mean gradient measures 19.0 mmHg. 7. Aortic dilatation noted. There is mild dilatation of the ascending aorta, measuring 40 mm. 8. Cardioverted first attempt at 120 J (unsuccessful), then 150 J second attempt (successful).  Conclusion(s)/Recommendation(s): No LA/LAA thrombus identified. Successful cardioversion performed with restoration of normal sinus rhythm.  FINDINGS Left Ventricle: Left ventricular ejection fraction, by estimation, is 35 to 40%. The left ventricle has moderately decreased function. The left ventricle demonstrates global hypokinesis. The left ventricular internal cavity  size was mildly dilated.  Right Ventricle: The right ventricular size is normal. No increase in right ventricular wall thickness. Right ventricular systolic function is normal.  Left Atrium: Left atrial size was mild to moderately dilated. No left atrial/left atrial appendage thrombus was detected. The LAA emptying velocity was 79 cm/s.  Right Atrium: Right atrial size was mildly dilated. Prominent Chiari network.  Pericardium: There is no evidence  of pericardial effusion.  Mitral Valve: The mitral valve is normal in structure. Moderately decreased mobility of the mitral valve leaflets. Mild mitral valve regurgitation. No evidence of mitral valve stenosis. MV peak gradient, 82.8 mmHg.  Tricuspid Valve: The tricuspid valve is normal in structure. Tricuspid valve regurgitation is trivial. No evidence of tricuspid stenosis.  Aortic Valve: The aortic valve is tricuspid. There is severe calcifcation of the aortic valve. There is moderate thickening of the aortic valve. Aortic valve regurgitation is moderate. Low flow low gradient severe AS. Aortic valve mean gradient measures 19.0 mmHg. Aortic valve peak gradient measures 28.3 mmHg.  Pulmonic Valve: The pulmonic valve was grossly normal. Pulmonic valve regurgitation is not visualized. No evidence of pulmonic stenosis.  Aorta: Aortic dilatation noted. There is mild dilatation of the ascending aorta, measuring 40 mm. There is minimal (Grade I) plaque involving the descending aorta.  IAS/Shunts: No atrial level shunt detected by color flow Doppler.  Additional Comments: Spectral Doppler performed.  AORTIC VALVE AV Vmax:      266.00 cm/s AV Vmean:     207.500 cm/s AV VTI:       0.508 m AV Peak Grad: 28.3 mmHg AV Mean Grad: 19.0 mmHg  MITRAL VALVE MV Peak grad: 82.8 mmHg MV Vmax:      4.55 m/s MV Vmean:     344.0 cm/s  Thomas Red MD Electronically signed by Thomas Red MD Signature Date/Time: 04/28/2022/4:15:40 PM    Final    CT SCANS  CT CORONARY MORPH W/CTA COR W/SCORE 04/28/2022  Addendum 04/28/2022 11:49 AM ADDENDUM REPORT: 04/28/2022 11:47  EXAM: OVER-READ INTERPRETATION  CT CHEST  The following report is an over-read performed by radiologist Dr. Jacob Moores [facility] on 04/28/2022. This over-read does not include interpretation of cardiac or coronary anatomy or pathology. The cardiac TAVR interpretation by the cardiologist is  attached.  COMPARISON:  None.  FINDINGS: Extracardiac findings will be described separately under dictation for contemporaneously obtained CTA chest, abdomen and pelvis.  IMPRESSION: Please see separate dictation for contemporaneously obtained CTA chest, abdomen and pelvis dated 04/28/2022 for full description of relevant extracardiac findings.   Electronically Signed By: Allegra Lai M.D. On: 04/28/2022 11:47  Narrative CLINICAL DATA:  Aortic valve replacement (TAVR), pre-op eval, severe aortic stenosis  EXAM: Cardiac TAVR CT  TECHNIQUE: The patient was scanned on a Siemens Force 192 slice scanner. A 120 kV retrospective scan was triggered in the descending thoracic aorta at 111 HU's. Gantry rotation speed was 270 msecs and collimation was .9 mm. The 3D data set was reconstructed in 5% intervals of the R-R cycle. Systolic and diastolic phases were analyzed on a dedicated work station using MPR, MIP and VRT modes. The patient received OMNIPAQUE IOHEXOL 350 MG/ML SOLN of contrast.  FINDINGS: Image quality: Good  Aortic Valve:  Tricuspid aortic valve with severely reduced cusp excursion. Severely thickened and severely calcified aortic valve cusps.  AV calcium score: 5671  Virtual Basal Annulus Measurements:  Maximum/Minimum Diameter: 29.1 x 22.5 mm  Perimeter: 79.3 mm  Area:  476 mm2  Trivial LVOT calcifications.  Membranous septal length: 6.4 mm, above virtual annulus  Based on these measurements, the annulus would be suitable for a 26 mm Sapien 3 valve. Alternatively, Heart Team can consider 29 mm Evolut valve. Recommend Heart Team discussion for valve selection.  Sinus of Valsalva Measurements:  Non-coronary:  36 mm  Right - coronary:  36 mm  Left - coronary:  37 mm  Sinus of Valsalva Height:  Left: 27.7 mm  Right: 27.9 mm  Aorta: Conventional 3 vessel branch pattern of aortic arch. No coarctation of the aorta.  Sinotubular  Junction:  37 mm  Ascending Thoracic Aorta:  39 mm  Aortic Arch:  28 mm  Descending Thoracic Aorta:  25 mm  Coronary Artery Height above Annulus:  Left main: 21.1 mm  Right coronary: 21 mm  Coronary Arteries: Normal coronary origin. Right dominance. The study was performed without use of NTG and insufficient for plaque evaluation. Coronary artery calcium seen in 3 vessel distribution. RCA origin at the level of the ST junction.  Optimum Fluoroscopic Angle for Delivery: LAO 0 CAU 0  OTHER:  Atria:  Left atrial appendage: Contrast mixing with incomplete opacification.  Mitral valve: Grossly normal, trivial mitral annular calcifications.  Pulmonary artery: Mild dilation, 34 mm.  Pulmonary veins: Normal anatomy.  IMPRESSION: 1. Tricuspid aortic valve with severely reduced cusp excursion. Severely thickened and severely calcified aortic valve cusps. 2. Aortic valve calcium score: 5671 3. Annulus area: 476 mm2, suitable for 26 mm Sapien 3 valve. Trivial LVOT calcifications. Membranous septal length 6 mm. 4. Sufficient coronary artery heights from annulus. 5. Optimum fluoroscopic angle for delivery: LAO 0 CAU 0  Electronically Signed: By: Weston Brass M.D. On: 04/28/2022 11:17          Recent Labs: 04/27/2022: TSH 0.361 07/09/2022: ALT 27 07/14/2022: Magnesium 1.6 07/19/2022: Hemoglobin 13.3; Platelets 206 10/18/2022: BUN 25; Creatinine, Ser 1.70; Potassium 3.9; Sodium 143   Recent Lipid Panel    Component Value Date/Time   CHOL 91 04/27/2022 0334   TRIG 77 04/27/2022 0334   HDL 30 (L) 04/27/2022 0334   CHOLHDL 3.0 04/27/2022 0334   VLDL 15 04/27/2022 0334   LDLCALC 46 04/27/2022 0334   LDLDIRECT 76.0 10/09/2020 0840   Physical Exam:    VS:  BP (!) 118/50   Pulse (!) 59   Ht 6' (1.829 m)   Wt 224 lb 6.4 oz (101.8 kg)   SpO2 93%   BMI 30.43 kg/m     Wt Readings from Last 3 Encounters:  10/18/22 224 lb 6.4 oz (101.8 kg)  09/15/22 218 lb 3.2 oz (99  kg)  09/06/22 221 lb 12.8 oz (100.6 kg)    General: Well developed, well nourished, NAD Lungs:Clear to ausculation bilaterally. No wheezes, rales, or rhonchi. Breathing is unlabored. Cardiovascular: RRR with S1 S2. No murmurs Extremities: 2+ BLE edema.  Neuro: Alert and oriented. No focal deficits. No facial asymmetry. MAE spontaneously. Psych: Responds to questions appropriately with normal affect.    ASSESSMENT/PLAN:    Severe AS: Continues to do well with NYHA class I symptoms s/p successful TAVR with a 26 mm Edwards S3UR on 07/13/22. One month echo with LVEF at 50-55% with a mean gradient at , peak at 25.36mmHg, AVA by 1.79cm2, DI 0.40 and with trivial PVL. Continue Eliqius monotherapy. Does not require dental SBE as he is edentulous.    Chronic diastolic HF with chronic LE edema: Continues to have 1-2+ BLE with some improvement with Torsemide 20mf  daily. Following BMET closely with increasing Cr. Plan for every other day dosing of Torsemide and follow in 3-4 weeks. Likely much of his edema related to venous sinsufficiency. Continue to encourage LE elevation and compression stockings. Decrease sodium intake.    CKD stage IIIa: Baseline Cr appears to be in the 1.5-1.6 range. Cr today 1.70. Plan to decrease Torsemide to every other day dosing and follow in 2-4 weeks with BMET. See above.  HTN: Stable with no changes    HLD: Continue Crestor   Prior CVA: No new neuro changes.     PAF s/p DCCV: Currenlty maintaining SB. Continue Eliquis and Amiodarone. No BB given baseline bradycardia.    PVC/PACs: Denies symptoms.    Incidental findings: Circumferential wall thickening of the distal right colon, possibly due to decompression, although neoplasm could have a similar appearance. Recommend further evaluation with colonoscopy. Discussed with patient and family. He has never had a colonoscopy. High encouraged to undergo colonoscopy given the above and no prior testing. Encouraged to move  follow up from 12/2022 closer. Denies bleeding in stool and no recent change in bowel habits.    Medication Adjustments/Labs and Tests Ordered: Current medicines are reviewed at length with the patient today.  Concerns regarding medicines are outlined above.  Orders Placed This Encounter  Procedures   Basic metabolic panel   No orders of the defined types were placed in this encounter.   Patient Instructions  Medication Instructions:  Your physician recommends that you continue on your current medications as directed. Please refer to the Current Medication list given to you today.  *If you need a refill on your cardiac medications before your next appointment, please call your pharmacy*   Lab Work: TODAY: BMET If you have labs (blood work) drawn today and your tests are completely normal, you will receive your results only by: MyChart Message (if you have MyChart) OR A paper copy in the mail If you have any lab test that is abnormal or we need to change your treatment, we will call you to review the results.   Testing/Procedures: NONE   Follow-Up: At [facility], you and your health needs are our priority.  As part of our continuing mission to provide you with exceptional heart care, we have created designated Provider Care Teams.  These Care Teams include your primary Cardiologist (physician) and Advanced Practice Providers (APPs -  Physician Assistants and Nurse Practitioners) who all work together to provide you with the care you need, when you need it.  We recommend signing up for the patient portal called "MyChart".  Sign up information is provided on this After Visit Summary.  MyChart is used to connect with patients for Virtual Visits (Telemedicine).  Patients are able to view lab/test results, encounter notes, upcoming appointments, etc.  Non-urgent messages can be sent to your provider as well.   To learn more about what you can do with MyChart, go to  [email or web address].    Your next appointment:   KEEP FOLLOW-UP WITH Thomas [name]    Signed, Georgie Chard, NP  10/19/2022 8:28 AM    [facility]

## 2022-10-18 ENCOUNTER — Ambulatory Visit: Payer: Medicare Other | Attending: Cardiology | Admitting: Cardiology

## 2022-10-18 VITALS — BP 118/50 | HR 59 | Ht 72.0 in | Wt 224.4 lb

## 2022-10-18 DIAGNOSIS — Z79899 Other long term (current) drug therapy: Secondary | ICD-10-CM

## 2022-10-18 DIAGNOSIS — E1122 Type 2 diabetes mellitus with diabetic chronic kidney disease: Secondary | ICD-10-CM | POA: Diagnosis not present

## 2022-10-18 DIAGNOSIS — I483 Typical atrial flutter: Secondary | ICD-10-CM | POA: Diagnosis not present

## 2022-10-18 DIAGNOSIS — I5042 Chronic combined systolic (congestive) and diastolic (congestive) heart failure: Secondary | ICD-10-CM | POA: Diagnosis not present

## 2022-10-18 DIAGNOSIS — I350 Nonrheumatic aortic (valve) stenosis: Secondary | ICD-10-CM | POA: Diagnosis not present

## 2022-10-18 DIAGNOSIS — I2510 Atherosclerotic heart disease of native coronary artery without angina pectoris: Secondary | ICD-10-CM

## 2022-10-18 DIAGNOSIS — N183 Chronic kidney disease, stage 3 unspecified: Secondary | ICD-10-CM

## 2022-10-18 DIAGNOSIS — Z952 Presence of prosthetic heart valve: Secondary | ICD-10-CM

## 2022-10-18 DIAGNOSIS — Z7984 Long term (current) use of oral hypoglycemic drugs: Secondary | ICD-10-CM | POA: Diagnosis not present

## 2022-10-18 DIAGNOSIS — R609 Edema, unspecified: Secondary | ICD-10-CM

## 2022-10-18 DIAGNOSIS — I10 Essential (primary) hypertension: Secondary | ICD-10-CM | POA: Diagnosis not present

## 2022-10-18 NOTE — Patient Instructions (Signed)
Medication Instructions:  Your physician recommends that you continue on your current medications as directed. Please refer to the Current Medication list given to you today.  *If you need a refill on your cardiac medications before your next appointment, please call your pharmacy*   Lab Work: TODAY: BMET If you have labs (blood work) drawn today and your tests are completely normal, you will receive your results only by: MyChart Message (if you have MyChart) OR A paper copy in the mail If you have any lab test that is abnormal or we need to change your treatment, we will call you to review the results.   Testing/Procedures: NONE   Follow-Up: At [facility], you and your health needs are our priority.  As part of our continuing mission to provide you with exceptional heart care, we have created designated Provider Care Teams.  These Care Teams include your primary Cardiologist (physician) and Advanced Practice Providers (APPs -  Physician Assistants and Nurse Practitioners) who all work together to provide you with the care you need, when you need it.  We recommend signing up for the patient portal called "MyChart".  Sign up information is provided on this After Visit Summary.  MyChart is used to connect with patients for Virtual Visits (Telemedicine).  Patients are able to view lab/test results, encounter notes, upcoming appointments, etc.  Non-urgent messages can be sent to your provider as well.   To learn more about what you can do with MyChart, go to [email or web address].    Your next appointment:   KEEP FOLLOW-UP WITH DR. Elease Hashimoto

## 2022-10-19 LAB — BASIC METABOLIC PANEL
BUN/Creatinine Ratio: 15 (ref 10–24)
BUN: 25 mg/dL (ref 8–27)
CO2: 27 mmol/L (ref 20–29)
Calcium: 9.5 mg/dL (ref 8.6–10.2)
Chloride: 99 mmol/L (ref 96–106)
Creatinine, Ser: 1.7 mg/dL — ABNORMAL HIGH (ref 0.76–1.27)
Glucose: 108 mg/dL — ABNORMAL HIGH (ref 70–99)
Potassium: 3.9 mmol/L (ref 3.5–5.2)
Sodium: 143 mmol/L (ref 134–144)
eGFR: 43 mL/min/{1.73_m2} — ABNORMAL LOW (ref >59)

## 2022-10-21 ENCOUNTER — Telehealth: Payer: Self-pay | Admitting: Cardiology

## 2022-10-21 MED ORDER — TORSEMIDE 20 MG PO TABS: 20.0000 mg | ORAL_TABLET | ORAL | Status: DC

## 2022-10-21 NOTE — Telephone Encounter (Signed)
The patient has been notified of the result and verbalized understanding.  All questions (if any) were answered. Franchot Gallo, RN 10/21/2022 9:58 AM    Medication list updated.

## 2022-10-21 NOTE — Telephone Encounter (Signed)
-----   Message from Georgie Chard sent at 10/19/2022  8:51 AM EDT ----- Please let the patient know that his Cr is a little higher than last labs. Lets have him take his torsemide every other day for now. I have scheduled a follow up with me 8/14 at 9:05.If that does not work, I can certainly change that to a date/time that will fit their schedule.

## 2022-10-29 ENCOUNTER — Other Ambulatory Visit: Payer: Self-pay | Admitting: Family Medicine

## 2022-11-08 DIAGNOSIS — M5451 Vertebrogenic low back pain: Secondary | ICD-10-CM | POA: Diagnosis not present

## 2022-11-08 DIAGNOSIS — Z79891 Long term (current) use of opiate analgesic: Secondary | ICD-10-CM | POA: Diagnosis not present

## 2022-11-08 DIAGNOSIS — M25569 Pain in unspecified knee: Secondary | ICD-10-CM | POA: Diagnosis not present

## 2022-11-08 DIAGNOSIS — G894 Chronic pain syndrome: Secondary | ICD-10-CM | POA: Diagnosis not present

## 2022-11-08 DIAGNOSIS — M961 Postlaminectomy syndrome, not elsewhere classified: Secondary | ICD-10-CM | POA: Diagnosis not present

## 2022-11-08 DIAGNOSIS — M542 Cervicalgia: Secondary | ICD-10-CM | POA: Diagnosis not present

## 2022-11-09 NOTE — Progress Notes (Unsigned)
[facility]   MULTIDISCIPLINARY HEART VALVE CLINIC                                     Cardiology Office Note:    Date:  11/10/2022   ID:  Thomas [name], DOB 11-20-1955, MRN [id number]  PCP:  Ardith Dark, MD  [facility] Cardiologist:  Kristeen Miss, MD / Dr. Clifton James, MD (TAVR)   Referring MD: Ardith Dark, MD   Chief Complaint  Patient presents with   Follow-up    LE edema    History of Present Illness:    Thomas [name] is a 67 y.o. male with a hx of DMT2, HTN, HLD, CKD stage IIIa, prior CVA, cervical myelopathy with cord compression s/p spinal surgery (8/23) and severe aortic stenosis s/p TAVR who is being seen today for follow up of LE edema.    Mr. Caplinger is followed by Dr. Elease Hashimoto for his cardiology care. He was referred to the structural heart team initially in Nov 2023 after finding severe aortic stenosis on pre-operative echocardiogram prior to cervical spine surgery. Echo at that time showed an LVEF at 60-65%, mild LVH, trivial MR, and moderately severe to severe aortic valve stenosis with mean gradient of 39 mmHg, peak gradient 68.6 mmHg, AVA 0.82 cm2, DI 0.29, SVI 32. Given cord compression, his cervical surgery was felt to be urgent and he is now s/p cervical spine decompression 10/28/21 with anterior cervical discectomies. He was seen post operatively by Dr. Elease Hashimoto on 04/17/22 with symptoms of LE edema, chest pain and dyspnea. EKG showed atrial flutter with RVR. He was admitted to [facility] and treated with amiodarone and started on Eliquis. He underwent a TEE guided cardioversion at that time. Repeat echo 04/26/22 showed reduced LVEF at 35% with global hypokinesis, mild MR, severe low flow/low gradient AS with mean gradient 24 mmHg, peak gradient 39 mmHg, AVA 0.81 cm2, SVI 25, DI 0.33. Cardiac cath 04/27/22 with minimal non-obstructive CAD. Cardiac CTA 04/28/22 with aortic valve calcium score of 5671. Valve area 476 mm2 suitable for a 26 mm Edwards Sapien 3 Ultra  valve or 29 mm Medtrontic Evolut valve.    He was then evaluated by the multidisciplinary valve team and felt to have severe, symptomatic aortic stenosis and to be a suitable candidate and is now s/p successful TAVR with a 26 mm Edwards Sapien 3 THV via the TF approach on 07/13/22. Post operative echo with mildly improved LVEF at 45-50% with a mean gradient at 12 mmHg and peak gradient at 23 mmHG, DVI 0.56. EOAi 1.43 cm2/m2. He was restarted on Eliqius for PAF. Does not require dental SBE as he is edentulous. He was noted to have BLE on exam that day and was instructed to take his PRN Lasix to help with this.    At one month follow up, he was doing very well however had 2-3+ bilateral LE edema. He was only taking intermittent Lasix. Lasix was changed to daily dosing with minimal improvement and transitioned to Torsemide.    In subsequent follow ups, edema was somewhat improved however Cr also increased slightly above baseline. Today he continues to do well with no chest pain, SOB, palpitations, or orthopnea however legs continue to be edematous. He continues to walk daily and complete outside tasks with no issues.   Past Medical History:  Diagnosis Date   Cervical myelopathy (HCC)  Diabetes mellitus without complication (HCC)    Hypercholesterolemia    Hypertension    S/P TAVR (transcatheter aortic valve replacement) 07/13/2022   26mm S3UR with Dr. Clifton James and Dr. Delia Chimes   Severe aortic stenosis    Stroke [facility]) 1982   pt states he had a mini stroke in 1982    Past Surgical History:  Procedure Laterality Date   ANTERIOR CERVICAL DECOMP/DISCECTOMY FUSION N/A 10/28/2021   Procedure: ACDF - C3-C4 - C4-C5 - C5-C6;  Surgeon: [name], MD;  Location: [facility] OR;  Service: Neurosurgery;  Laterality: N/A;   BACK SURGERY     CARDIOVERSION N/A 04/28/2022   Procedure: CARDIOVERSION;  Surgeon: Jodelle Red, MD;  Location: [facility] ENDOSCOPY;  Service: Cardiovascular;  Laterality: N/A;   CARDIOVERSION  N/A 05/31/2022   Procedure: CARDIOVERSION;  Surgeon: Meriam Sprague, MD;  Location: [facility] ENDOSCOPY;  Service: Cardiovascular;  Laterality: N/A;   HERNIA REPAIR     INTRAOPERATIVE TRANSTHORACIC ECHOCARDIOGRAM N/A 07/13/2022   Procedure: INTRAOPERATIVE TRANSTHORACIC ECHOCARDIOGRAM;  Surgeon: Kathleene Hazel, MD;  Location: [facility] INVASIVE CV LAB;  Service: Open Heart Surgery;  Laterality: N/A;   RIGHT/LEFT HEART CATH AND CORONARY ANGIOGRAPHY N/A 04/27/2022   Procedure: RIGHT/LEFT HEART CATH AND CORONARY ANGIOGRAPHY;  Surgeon: Lennette Bihari, MD;  Location: [facility] INVASIVE CV LAB;  Service: Cardiovascular;  Laterality: N/A;   TEE WITHOUT CARDIOVERSION N/A 04/28/2022   Procedure: TRANSESOPHAGEAL ECHOCARDIOGRAM (TEE);  Surgeon: Jodelle Red, MD;  Location: [facility] ENDOSCOPY;  Service: Cardiovascular;  Laterality: N/A;   TRANSCATHETER AORTIC VALVE REPLACEMENT, TRANSFEMORAL N/A 07/13/2022   Procedure: Transcatheter Aortic Valve Replacement, Transfemoral;  Surgeon: Kathleene Hazel, MD;  Location: [facility] INVASIVE CV LAB;  Service: Open Heart Surgery;  Laterality: N/A;    Current Medications: Current Meds  Medication Sig   amiodarone (PACERONE) 200 MG tablet Take 200 mg by mouth daily.   apixaban (ELIQUIS) 5 MG TABS tablet Take 1 tablet (5 mg total) by mouth 2 (two) times daily.   Apple Cider Vinegar 500 MG TABS Take 500 mg by mouth at bedtime.   diclofenac sodium (VOLTAREN) 1 % GEL Apply 1 Application topically 3 (three) times daily as needed (pain).   gabapentin (NEURONTIN) 400 MG capsule Take 400 mg by mouth 3 (three) times daily.   Ginger, Zingiber officinalis, (GINGER PO) Take 500 mg by mouth daily.   glimepiride (AMARYL) 4 MG tablet TAKE ONE TABLET ONCE DAILY WITH BREAKFAST   Lancets (ONETOUCH DELICA PLUS LANCET33G) MISC CHECK BLOOD SUGAR TWICE DAILY   levothyroxine (SYNTHROID) 150 MCG tablet TAKE ONE TABLET ONCE DAILY BEFORE BREAKFAST   metFORMIN (GLUCOPHAGE) 1000 MG tablet TAKE ONE  TABLET TWICE DAILY WITH FOOD   morphine (MSIR) 15 MG tablet Take 15 mg by mouth every 8 (eight) hours as needed for moderate pain.   ONETOUCH VERIO test strip CHECK BLOOD SUGAR UP TO THREE TIMES DAILY AS DIRECTED   potassium chloride SA (KLOR-CON M) 20 MEQ tablet Take 1 tablet (20 mEq total) by mouth daily.   rosuvastatin (CRESTOR) 10 MG tablet Take 1 tablet (10 mg total) by mouth daily.   torsemide (DEMADEX) 20 MG tablet Take 1 tablet (20 mg total) by mouth every other day.   Turmeric (QC TUMERIC COMPLEX) 500 MG CAPS Take 500 mg by mouth at bedtime.     Allergies:   Trulicity [dulaglutide], Januvia [sitagliptin], Other, and Penicillins   Social History   Socioeconomic History   Marital status: Married    Spouse name: Not on file  Number of children: 3   Years of education: Not on file   Highest education level: Not on file  Occupational History   Occupation: Retired from Designer, fashion/clothing, also drove a dump truck  Tobacco Use   Smoking status: Never   Smokeless tobacco: Never  Vaping Use   Vaping status: Never Used  Substance and Sexual Activity   Alcohol use: Never    Comment: occ beer    Drug use: No   Sexual activity: Not on file  Other Topics Concern   Not on file  Social History Narrative   Not on file   Social Determinants of Health   Financial Resource Strain: Low Risk  (03/04/2022)   Overall Financial Resource Strain (CARDIA)    Difficulty of Paying Living Expenses: Not hard at all  Food Insecurity: No Food Insecurity (07/13/2022)   Hunger Vital Sign    Worried About Running Out of Food in the Last Year: Never true    Ran Out of Food in the Last Year: Never true  Transportation Needs: No Transportation Needs (07/13/2022)   PRAPARE - Administrator, Civil Service (Medical): No    Lack of Transportation (Non-Medical): No  Physical Activity: Insufficiently Active (03/04/2022)   Exercise Vital Sign    Days of Exercise per Week: 7 days    Minutes of Exercise per  Session: 20 min  Stress: No Stress Concern Present (03/04/2022)   [employer] of Occupational Health - Occupational Stress Questionnaire    Feeling of Stress : Not at all  Social Connections: Moderately Isolated (03/04/2022)   Social Connection and Isolation Panel [NHANES]    Frequency of Communication with Friends and Family: Once a week    Frequency of Social Gatherings with Friends and Family: More than three times a week    Attends Religious Services: Never    Database administrator or Organizations: No    Attends Engineer, structural: Never    Marital Status: Married     Family History: The patient's family history includes Arthritis in his father and mother; Breast cancer in his sister; Diabetes in his mother; Heart attack in his mother; Hypertension in his mother; Skin cancer in his father.  ROS:   Please see the history of present illness.    All other systems reviewed and are negative.  EKGs/Labs/Other Studies Reviewed:    The following studies were reviewed today:  Cardiac Studies & Procedures   CARDIAC CATHETERIZATION  CARDIAC CATHETERIZATION 04/27/2022  Narrative   Mid RCA lesion is 20% stenosed.   Mid LM lesion is 10% stenosed.   Prox LAD lesion is 20% stenosed.   Mid Cx lesion is 20% stenosed.   There is severe aortic valve stenosis.  Mild coronary calcification with nonobstructive disease with smooth 10% distal left main stenosis, 20% proximal LAD stenosis, 20% AV groove circumflex stenosis, and 20% RCA stenosis.  Moderate right heart pressure elevation with moderate pulmonary hypertension with mean PA pressure 35 mmHg.  Severe aortic stenosis with a mean gradient 28.2, peak to peak gradient 33 mmHg, and calculated AVA 0.9 cm.  RECOMMENDATION: Patient will be treated with anticoagulation with his atrial flutter/fibrillation with probable attempted cardioversion. Ultimately will need structural heart team evaluation for aortic valve  replacement.  Findings Coronary Findings Diagnostic  Dominance: Right  Left Main Mid LM lesion is 10% stenosed.  Left Anterior Descending Prox LAD lesion is 20% stenosed.  Left Circumflex Mid Cx lesion is 20% stenosed.  Right Coronary  Artery Mid RCA lesion is 20% stenosed.  Intervention  No interventions have been documented.     ECHOCARDIOGRAM  ECHOCARDIOGRAM COMPLETE 08/18/2022  Narrative ECHOCARDIOGRAM REPORT    Patient Name:   MACKLIN EINSTEIN Date of Exam: 08/18/2022 Medical Rec #:  [id number]       Height:       72.0 in Accession #:    [id number]      Weight:       224.8 lb Date of Birth:  08/29/1955       BSA:          2.239 m Patient Age:    67 years        BP:           133/66 mmHg Patient Gender: M               HR:           55 bpm. Exam Location:  [address]  Procedure: 2D Echo, 3D Echo, Cardiac Doppler and Color Doppler  Indications:    Z95.2 Status post TAVR  History:        Patient has prior history of Echocardiogram examinations, most recent 07/14/2022. Stroke, Aortic Valve Disease; Risk Factors:Family History of Coronary Artery Disease, Hypertension, Dyslipidemia and Diabetes. Aortic Stenosis status post TAVR- 07-13-22, 26mm Edwards S3UR. Aortic Valve: 26 mm Edwards Sapien prosthetic, stented (TAVR) valve is present in the aortic position.  Sonographer:    Farrel Conners RDCS Referring Phys: Deri Fuelling   IMPRESSIONS   1. Left ventricular ejection fraction, by estimation, is 50 to 55%. The left ventricle has low normal function. The left ventricle has no regional wall motion abnormalities. There is mild left ventricular hypertrophy. Left ventricular diastolic parameters are indeterminate. 2. Right ventricular systolic function is normal. The right ventricular size is normal. Tricuspid regurgitation signal is inadequate for assessing PA pressure. 3. The mitral valve is normal in structure. No evidence of mitral valve regurgitation. No  evidence of mitral stenosis. 4. The aortic valve has been repaired/replaced. Aortic valve regurgitation is trivial. There is a 26 mm Edwards Sapien prosthetic (TAVR) valve present in the aortic position. Aortic valve mean gradient measures 14.0 mmHg. Vmax 2.5 m/s, MG 14 mmHg, EOA 2.0 cm^2, DI 0.40. Trivial PVL 5. The inferior vena cava is normal in size with greater than 50% respiratory variability, suggesting right atrial pressure of 3 mmHg.  FINDINGS Left Ventricle: Left ventricular ejection fraction, by estimation, is 50 to 55%. The left ventricle has low normal function. The left ventricle has no regional wall motion abnormalities. The left ventricular internal cavity size was normal in size. There is mild left ventricular hypertrophy. Left ventricular diastolic parameters are indeterminate.  Right Ventricle: The right ventricular size is normal. No increase in right ventricular wall thickness. Right ventricular systolic function is normal. Tricuspid regurgitation signal is inadequate for assessing PA pressure.  Left Atrium: Left atrial size was normal in size.  Right Atrium: Right atrial size was normal in size.  Pericardium: There is no evidence of pericardial effusion.  Mitral Valve: The mitral valve is normal in structure. No evidence of mitral valve regurgitation. No evidence of mitral valve stenosis.  Tricuspid Valve: The tricuspid valve is normal in structure. Tricuspid valve regurgitation is trivial.  Aortic Valve: The aortic valve has been repaired/replaced. Aortic valve regurgitation is trivial. Aortic regurgitation PHT measures 346 msec. Aortic valve mean gradient measures 14.0 mmHg. Aortic valve peak gradient measures 25.6 mmHg. Aortic valve area, by  VTI measures 1.79 cm. There is a 26 mm Edwards Sapien prosthetic, stented (TAVR) valve present in the aortic position.  Pulmonic Valve: The pulmonic valve was not well visualized. Pulmonic valve regurgitation is  trivial.  Aorta: The aortic root is normal in size and structure.  Venous: The inferior vena cava is normal in size with greater than 50% respiratory variability, suggesting right atrial pressure of 3 mmHg.  IAS/Shunts: The interatrial septum was not well visualized.   LEFT VENTRICLE PLAX 2D LVIDd:         5.30 cm   Diastology LVIDs:         3.40 cm   LV e' medial:    6.31 cm/s LV PW:         1.00 cm   LV E/e' medial:  19.4 LV IVS:        1.30 cm   LV e' lateral:   9.57 cm/s LVOT diam:     2.30 cm   LV E/e' lateral: 12.8 LV SV:         110 LV SV Index:   49 LVOT Area:     4.15 cm  3D Volume EF: 3D EF:        58 % LV EDV:       145 ml LV ESV:       61 ml LV SV:        84 ml  RIGHT VENTRICLE RV Basal diam:  4.10 cm RV Mid diam:    3.60 cm RV S prime:     11.70 cm/s TAPSE (M-mode): 2.6 cm  LEFT ATRIUM             Index        RIGHT ATRIUM           Index LA diam:        4.60 cm 2.05 cm/m   RA Pressure: 3.00 mmHg LA Vol (A2C):   66.3 ml 29.61 ml/m  RA Area:     16.00 cm LA Vol (A4C):   69.1 ml 30.86 ml/m  RA Volume:   44.50 ml  19.87 ml/m LA Biplane Vol: 68.8 ml 30.73 ml/m AORTIC VALVE AV Area (Vmax):    1.75 cm AV Area (Vmean):   1.71 cm AV Area (VTI):     1.79 cm AV Vmax:           253.00 cm/s AV Vmean:          170.667 cm/s AV VTI:            0.615 m AV Peak Grad:      25.6 mmHg AV Mean Grad:      14.0 mmHg LVOT Vmax:         106.32 cm/s LVOT Vmean:        70.225 cm/s LVOT VTI:          0.265 m LVOT/AV VTI ratio: 0.43 AI PHT:            346 msec  AORTA Ao Root diam: 3.80 cm Ao Asc diam:  3.80 cm  MITRAL VALVE                TRICUSPID VALVE MV Area (PHT): cm          Estimated RAP:  3.00 mmHg MV Decel Time: 374 msec MV E velocity: 122.50 cm/s  SHUNTS MV A velocity: 122.50 cm/s  Systemic VTI:  0.26 m MV E/A ratio:  1.00  Systemic Diam: 2.30 cm  Epifanio Lesches MD Electronically signed by Epifanio Lesches MD Signature Date/Time:  08/18/2022/1:34:30 PM    Final   TEE  ECHO TEE 04/28/2022  Narrative TRANSESOPHOGEAL ECHO REPORT    Patient Name:   HASNAIN MEITZ Date of Exam: 04/28/2022 Medical Rec #:  [id number]       Height:       72.0 in Accession #:    [id number]      Weight:       222.0 lb Date of Birth:  [birth year]       BSA:          2.227 m Patient Age:    66 years        BP:           98/59 mmHg Patient Gender: M               HR:           108 bpm. Exam Location:  Inpatient  Procedure: Cardiac Doppler, Color Doppler, Transesophageal Echo and 3D Echo  Indications:    A-flutter/AS  History:        Patient has prior history of Echocardiogram examinations, most recent 04/26/2022. CHF, Stroke, Aortic stenosis; Risk Factors:Diabetes, Hypertension and Dyslipidemia. CKD.  Sonographer:    Milda Smart Referring Phys: Jodelle Red  PROCEDURE: After discussion of the risks and benefits of a TEE, an informed consent was obtained from the patient. TEE procedure time was 28 minutes. The transesophogeal probe was passed without difficulty through the esophogus of the patient. Imaged were obtained with the patient in a left lateral decubitus position. Local oropharyngeal anesthetic was provided with viscous lidocaine. Sedation performed by different physician. The patient was monitored while under deep sedation. Anesthestetic sedation was provided intravenously by Anesthesiology: 223.5mg  of Propofol. Image quality was good. The patient's vital signs; including heart rate, blood pressure, and oxygen saturation; remained stable throughout the procedure. The patient developed no complications during the procedure. A successful direct current cardioversion was performed at 120 joules with 2 attempts. Cardioverted first attempt at 120 J (unsuccessful), then 150 J second attempt (successful).  IMPRESSIONS   1. Left ventricular ejection fraction, by estimation, is 35 to 40%. The left ventricle has moderately  decreased function. The left ventricle demonstrates global hypokinesis. The left ventricular internal cavity size was mildly dilated. 2. Right ventricular systolic function is normal. The right ventricular size is normal. 3. Left atrial size was mild to moderately dilated. No left atrial/left atrial appendage thrombus was detected. The LAA emptying velocity was 79 cm/s. 4. Right atrial size was mildly dilated. 5. The mitral valve is normal in structure. Mild mitral valve regurgitation. No evidence of mitral stenosis. 6. The aortic valve is tricuspid. There is severe calcifcation of the aortic valve. There is moderate thickening of the aortic valve. Aortic valve regurgitation is moderate. Low flow low gradient severe AS. Aortic valve mean gradient measures 19.0 mmHg. 7. Aortic dilatation noted. There is mild dilatation of the ascending aorta, measuring 40 mm. 8. Cardioverted first attempt at 120 J (unsuccessful), then 150 J second attempt (successful).  Conclusion(s)/Recommendation(s): No LA/LAA thrombus identified. Successful cardioversion performed with restoration of normal sinus rhythm.  FINDINGS Left Ventricle: Left ventricular ejection fraction, by estimation, is 35 to 40%. The left ventricle has moderately decreased function. The left ventricle demonstrates global hypokinesis. The left ventricular internal cavity size was mildly dilated.  Right Ventricle: The right ventricular size is normal. No increase in right ventricular  wall thickness. Right ventricular systolic function is normal.  Left Atrium: Left atrial size was mild to moderately dilated. No left atrial/left atrial appendage thrombus was detected. The LAA emptying velocity was 79 cm/s.  Right Atrium: Right atrial size was mildly dilated. Prominent Chiari network.  Pericardium: There is no evidence of pericardial effusion.  Mitral Valve: The mitral valve is normal in structure. Moderately decreased mobility of the mitral valve  leaflets. Mild mitral valve regurgitation. No evidence of mitral valve stenosis. MV peak gradient, 82.8 mmHg.  Tricuspid Valve: The tricuspid valve is normal in structure. Tricuspid valve regurgitation is trivial. No evidence of tricuspid stenosis.  Aortic Valve: The aortic valve is tricuspid. There is severe calcifcation of the aortic valve. There is moderate thickening of the aortic valve. Aortic valve regurgitation is moderate. Low flow low gradient severe AS. Aortic valve mean gradient measures 19.0 mmHg. Aortic valve peak gradient measures 28.3 mmHg.  Pulmonic Valve: The pulmonic valve was grossly normal. Pulmonic valve regurgitation is not visualized. No evidence of pulmonic stenosis.  Aorta: Aortic dilatation noted. There is mild dilatation of the ascending aorta, measuring 40 mm. There is minimal (Grade I) plaque involving the descending aorta.  IAS/Shunts: No atrial level shunt detected by color flow Doppler.  Additional Comments: Spectral Doppler performed.  AORTIC VALVE AV Vmax:      266.00 cm/s AV Vmean:     207.500 cm/s AV VTI:       0.508 m AV Peak Grad: 28.3 mmHg AV Mean Grad: 19.0 mmHg  MITRAL VALVE MV Peak grad: 82.8 mmHg MV Vmax:      4.55 m/s MV Vmean:     344.0 cm/s  Jodelle Red MD Electronically signed by Jodelle Red MD Signature Date/Time: 04/28/2022/4:15:40 PM    Final    CT SCANS  CT CORONARY MORPH W/CTA COR W/SCORE 04/28/2022  Addendum 04/28/2022 11:49 AM ADDENDUM REPORT: 04/28/2022 11:47  EXAM: OVER-READ INTERPRETATION  CT CHEST  The following report is an over-read performed by radiologist Dr. Jacob Moores [facility] on 04/28/2022. This over-read does not include interpretation of cardiac or coronary anatomy or pathology. The cardiac TAVR interpretation by the cardiologist is attached.  COMPARISON:  None.  FINDINGS: Extracardiac findings will be described separately under dictation for  contemporaneously obtained CTA chest, abdomen and pelvis.  IMPRESSION: Please see separate dictation for contemporaneously obtained CTA chest, abdomen and pelvis dated 04/28/2022 for full description of relevant extracardiac findings.   Electronically Signed By: Allegra Lai M.D. On: 04/28/2022 11:47  Narrative CLINICAL DATA:  Aortic valve replacement (TAVR), pre-op eval, severe aortic stenosis  EXAM: Cardiac TAVR CT  TECHNIQUE: The patient was scanned on a Siemens Force 192 slice scanner. A 120 kV retrospective scan was triggered in the descending thoracic aorta at 111 HU's. Gantry rotation speed was 270 msecs and collimation was .9 mm. The 3D data set was reconstructed in 5% intervals of the R-R cycle. Systolic and diastolic phases were analyzed on a dedicated work station using MPR, MIP and VRT modes. The patient received OMNIPAQUE IOHEXOL 350 MG/ML SOLN of contrast.  FINDINGS: Image quality: Good  Aortic Valve:  Tricuspid aortic valve with severely reduced cusp excursion. Severely thickened and severely calcified aortic valve cusps.  AV calcium score: 5671  Virtual Basal Annulus Measurements:  Maximum/Minimum Diameter: 29.1 x 22.5 mm  Perimeter: 79.3 mm  Area:  476 mm2  Trivial LVOT calcifications.  Membranous septal length: 6.4 mm, above virtual annulus  Based on these measurements, the  annulus would be suitable for a 26 mm Sapien 3 valve. Alternatively, Heart Team can consider 29 mm Evolut valve. Recommend Heart Team discussion for valve selection.  Sinus of Valsalva Measurements:  Non-coronary:  36 mm  Right - coronary:  36 mm  Left - coronary:  37 mm  Sinus of Valsalva Height:  Left: 27.7 mm  Right: 27.9 mm  Aorta: Conventional 3 vessel branch pattern of aortic arch. No coarctation of the aorta.  Sinotubular Junction:  37 mm  Ascending Thoracic Aorta:  39 mm  Aortic Arch:  28 mm  Descending Thoracic Aorta:  25  mm  Coronary Artery Height above Annulus:  Left main: 21.1 mm  Right coronary: 21 mm  Coronary Arteries: Normal coronary origin. Right dominance. The study was performed without use of NTG and insufficient for plaque evaluation. Coronary artery calcium seen in 3 vessel distribution. RCA origin at the level of the ST junction.  Optimum Fluoroscopic Angle for Delivery: LAO 0 CAU 0  OTHER:  Atria:  Left atrial appendage: Contrast mixing with incomplete opacification.  Mitral valve: Grossly normal, trivial mitral annular calcifications.  Pulmonary artery: Mild dilation, 34 mm.  Pulmonary veins: Normal anatomy.  IMPRESSION: 1. Tricuspid aortic valve with severely reduced cusp excursion. Severely thickened and severely calcified aortic valve cusps. 2. Aortic valve calcium score: 5671 3. Annulus area: 476 mm2, suitable for 26 mm Sapien 3 valve. Trivial LVOT calcifications. Membranous septal length 6 mm. 4. Sufficient coronary artery heights from annulus. 5. Optimum fluoroscopic angle for delivery: LAO 0 CAU 0  Electronically Signed: By: Weston Brass M.D. On: 04/28/2022 11:17          EKG:  EKG is not ordered today.   Recent Labs: 04/27/2022: TSH 0.361 07/09/2022: ALT 27 07/14/2022: Magnesium 1.6 07/19/2022: Hemoglobin 13.3; Platelets 206 10/18/2022: BUN 25; Creatinine, Ser 1.70; Potassium 3.9; Sodium 143   Recent Lipid Panel    Component Value Date/Time   CHOL 91 04/27/2022 0334   TRIG 77 04/27/2022 0334   HDL 30 (L) 04/27/2022 0334   CHOLHDL 3.0 04/27/2022 0334   VLDL 15 04/27/2022 0334   LDLCALC 46 04/27/2022 0334   LDLDIRECT 76.0 10/09/2020 0840   Physical Exam:    VS:  BP (!) 110/48   Pulse (!) 58   Ht 6' (1.829 m)   Wt 228 lb (103.4 kg)   SpO2 93%   BMI 30.92 kg/m     Wt Readings from Last 3 Encounters:  11/10/22 228 lb (103.4 kg)  10/18/22 224 lb 6.4 oz (101.8 kg)  09/15/22 218 lb 3.2 oz (99 kg)    General: Well developed, well nourished,  NAD Lungs:Clear to ausculation bilaterally. No wheezes, rales, or rhonchi. Breathing is unlabored. Cardiovascular: RRR with S1 S2. No murmurs Extremities: 1-2+ chronic BLE edema.  Neuro: Alert and oriented. No focal deficits. No facial asymmetry. MAE spontaneously. Psych: Responds to questions appropriately with normal affect.    ASSESSMENT/PLAN:    Severe AS: Continues to do well with NYHA class I symptoms s/p successful TAVR with a 26 mm Edwards S3UR on 07/13/22. One month echo with LVEF at 50-55% with a mean gradient at , peak at 25.45mmHg, AVA by 1.79cm2, DI 0.40 and with trivial PVL. Continue Eliqius monotherapy. Does not require dental SBE as he is edentulous.    Chronic diastolic HF with chronic LE edema: Continues to have 1-2+ BLE with some improvement with Torsemide 20mg . Initially placed on daily therapy however Cr increased and this was reduced  to every other day dosing. Plan to continue Torsemide 20mg  every other day. May take an additional 20mg  PRN for increased swelling or weight. BMET today to assess for Cr down trend. Continue to encourage LE elevation and compression stockings. Decrease sodium intake.    CKD stage IIIa: Baseline Cr appears to be in the 1.5-1.6 range. Last Cr 1.70. Decreased Torsemide to every other day dosing>>continue. BMET today .   HTN: Stable with no changes    HLD: Continue Crestor   Prior CVA: No new neuro changes.     PAF s/p DCCV: Currenlty maintaining SB. Continue Eliquis and Amiodarone. No BB given baseline bradycardia.    PVC/PACs: Denies symptoms.    Incidental findings: Circumferential wall thickening of the distal right colon, possibly due to decompression, although neoplasm could have a similar appearance. Recommend further evaluation with colonoscopy. Discussed with patient and family. He has never had a colonoscopy. High encouraged to undergo colonoscopy given the above and no prior testing. Encouraged to move follow up from 12/2022  closer. Denies bleeding in stool and no recent change in bowel habits.    Medication Adjustments/Labs and Tests Ordered: Current medicines are reviewed at length with the patient today.  Concerns regarding medicines are outlined above.  No orders of the defined types were placed in this encounter.  No orders of the defined types were placed in this encounter.   There are no Patient Instructions on file for this visit.   Signed, Georgie Chard, NP  11/10/2022 9:15 AM     [facility]

## 2022-11-10 ENCOUNTER — Ambulatory Visit: Payer: Medicare Other | Attending: Internal Medicine | Admitting: Cardiology

## 2022-11-10 VITALS — BP 110/48 | HR 58 | Ht 72.0 in | Wt 228.0 lb

## 2022-11-10 DIAGNOSIS — E1122 Type 2 diabetes mellitus with diabetic chronic kidney disease: Secondary | ICD-10-CM | POA: Diagnosis not present

## 2022-11-10 DIAGNOSIS — Z7984 Long term (current) use of oral hypoglycemic drugs: Secondary | ICD-10-CM

## 2022-11-10 DIAGNOSIS — Z79899 Other long term (current) drug therapy: Secondary | ICD-10-CM | POA: Diagnosis not present

## 2022-11-10 DIAGNOSIS — N183 Chronic kidney disease, stage 3 unspecified: Secondary | ICD-10-CM | POA: Diagnosis not present

## 2022-11-10 DIAGNOSIS — I350 Nonrheumatic aortic (valve) stenosis: Secondary | ICD-10-CM

## 2022-11-10 DIAGNOSIS — I5042 Chronic combined systolic (congestive) and diastolic (congestive) heart failure: Secondary | ICD-10-CM | POA: Diagnosis not present

## 2022-11-10 DIAGNOSIS — E785 Hyperlipidemia, unspecified: Secondary | ICD-10-CM | POA: Diagnosis not present

## 2022-11-10 DIAGNOSIS — K639 Disease of intestine, unspecified: Secondary | ICD-10-CM

## 2022-11-10 DIAGNOSIS — Z952 Presence of prosthetic heart valve: Secondary | ICD-10-CM

## 2022-11-10 DIAGNOSIS — E1169 Type 2 diabetes mellitus with other specified complication: Secondary | ICD-10-CM | POA: Diagnosis not present

## 2022-11-10 DIAGNOSIS — I10 Essential (primary) hypertension: Secondary | ICD-10-CM

## 2022-11-10 LAB — BASIC METABOLIC PANEL
BUN/Creatinine Ratio: 16 (ref 10–24)
BUN: 26 mg/dL (ref 8–27)
CO2: 26 mmol/L (ref 20–29)
Calcium: 9.4 mg/dL (ref 8.6–10.2)
Chloride: 101 mmol/L (ref 96–106)
Creatinine, Ser: 1.66 mg/dL — ABNORMAL HIGH (ref 0.76–1.27)
Glucose: 121 mg/dL — ABNORMAL HIGH (ref 70–99)
Potassium: 3.8 mmol/L (ref 3.5–5.2)
Sodium: 146 mmol/L — ABNORMAL HIGH (ref 134–144)
eGFR: 45 mL/min/{1.73_m2} — ABNORMAL LOW (ref >59)

## 2022-11-10 NOTE — Patient Instructions (Signed)
Medication Instructions:  Your physician recommends that you continue on your current medications as directed. Please refer to the Current Medication list given to you today.  *If you need a refill on your cardiac medications before your next appointment, please call your pharmacy*   Lab Work: TODAY: BMET If you have labs (blood work) drawn today and your tests are completely normal, you will receive your results only by: MyChart Message (if you have MyChart) OR A paper copy in the mail If you have any lab test that is abnormal or we need to change your treatment, we will call you to review the results.   Testing/Procedures: NONE   Follow-Up: At  [facility], you and your health needs are our priority.  As part of our continuing mission to provide you with exceptional heart care, we have created designated Provider Care Teams.  These Care Teams include your primary Cardiologist (physician) and Advanced Practice Providers (APPs -  Physician Assistants and Nurse Practitioners) who all work together to provide you with the care you need, when you need it.  We recommend signing up for the patient portal called "MyChart".  Sign up information is provided on this After Visit Summary.  MyChart is used to connect with patients for Virtual Visits (Telemedicine).  Patients are able to view lab/test results, encounter notes, upcoming appointments, etc.  Non-urgent messages can be sent to your provider as well.   To learn more about what you can do with MyChart, go to [email or web address].    Your next appointment:   KEEP SCHEDULED FOLLOW-UP 

## 2022-11-16 ENCOUNTER — Other Ambulatory Visit: Payer: Self-pay | Admitting: Family Medicine

## 2022-12-06 DIAGNOSIS — M5451 Vertebrogenic low back pain: Secondary | ICD-10-CM | POA: Diagnosis not present

## 2022-12-06 DIAGNOSIS — M4322 Fusion of spine, cervical region: Secondary | ICD-10-CM | POA: Diagnosis not present

## 2022-12-06 DIAGNOSIS — M542 Cervicalgia: Secondary | ICD-10-CM | POA: Diagnosis not present

## 2022-12-06 DIAGNOSIS — G894 Chronic pain syndrome: Secondary | ICD-10-CM | POA: Diagnosis not present

## 2022-12-06 DIAGNOSIS — M25569 Pain in unspecified knee: Secondary | ICD-10-CM | POA: Diagnosis not present

## 2022-12-06 DIAGNOSIS — M961 Postlaminectomy syndrome, not elsewhere classified: Secondary | ICD-10-CM | POA: Diagnosis not present

## 2022-12-06 DIAGNOSIS — Z79899 Other long term (current) drug therapy: Secondary | ICD-10-CM | POA: Diagnosis not present

## 2022-12-06 DIAGNOSIS — Z79891 Long term (current) use of opiate analgesic: Secondary | ICD-10-CM | POA: Diagnosis not present

## 2022-12-11 ENCOUNTER — Other Ambulatory Visit: Payer: Self-pay | Admitting: Family Medicine

## 2022-12-17 ENCOUNTER — Ambulatory Visit: Payer: Medicare Other | Admitting: Cardiovascular Disease

## 2022-12-26 ENCOUNTER — Encounter: Payer: Self-pay | Admitting: Cardiovascular Disease

## 2022-12-26 NOTE — Progress Notes (Unsigned)
Cardiology Office Note:    Date:  12/27/2022   ID:  Thomas [name], DOB 06-Apr-1955, MRN [id number]  PCP:  Ardith Dark, MD   [facility] Providers Cardiologist:  Sakinah Rosamond   Referring MD: Ardith Dark, MD   Chief Complaint  Patient presents with   TAVR   Atrial Fibrillation     History of Present Illness:    Thomas [name] is a 67 y.o. male with a hx of DM, HLD, HTN, stroke. He was recently scheduled to have some neck  surgery with Dr. Wynetta Emery.  He had an echocardiogram prior to his scheduled back surgery and he was found to have severe aortic stenosis.  Has occasional arm numbness / weakness.   Scheduled him for a working visit to discuss his aortic stenosis. His daughter is Thomas [name] ( our Engineer, site )   Seen with wife, Thomas [name].  Has been told he has a murmur years ago.  Has not had any cardiac issues related to his AS No cp, dyspnea, syncope, no presyncope  Is not limited from a cardiology standpoint Able to do yard work without any CP or dyspnea   Walks with a cane because of his R leg nerve / back issues.   Occasional palpitations that last for 1-2 seconds .      Avoids salt, Eats a fairly healthy diet Worked in [employer], and [employer] )  Drove a dump truck,  has been a Curator   I have discussed the case with Dr. Wynetta Emery. Thomas [name] is myelopathic - has ongoing  compression of his spinal cord.  The case would be about 2.5 hours. Anticipated blood loss would be minimal .    Oct. 24, 2023  Thomas [name] is seen for follow up of his AS, recent neck surgery  His numbness in his arms is better  Still has lowe back issues with numbness in his R leg - no plans for surgery .   Jan. 29, 2024  Thomas [name] is seen today for leg swelling , worsening chest pain  Was found to have atrial flutter on ECG today   Avoids salt   Not much exercise  Not inclined   Performed CSM today ,  was able to demonstrate A-flutter waves -  was not able to record on paper   Some shortness of breath .  Not necessarliy worse than usual  Wt is 222 lbs ( up 4 lbs from last visit in Oct. )   No CP .   Can feel the heart fluttering on occasion    June 15, 2022 Thomas [name] is seen for follow up of his atrial flutter,   seen with wife and daughter Thomas [name] .   He is s/p cardioversion on March 4. LV EF is 40-45%,  grade II DD  Mild MR  Has maintained sinus sinus rhythm    His TAVR has been scheduled for a month from now. He has requested that his daughters FMLA be extended for a month which is very reasonable   Can cancel appt with me on April 1 .   Sept. 30, 2024  Thomas [name] is seen for follow up of his AS / TAVR , CHF Doing well . No CP , no dyspnea   Wants to change to generic pradaxa        Past Medical History:  Diagnosis Date   Cervical myelopathy (HCC)    Diabetes mellitus without complication (HCC)    Hypercholesterolemia  Hypertension    S/P TAVR (transcatheter aortic valve replacement) 07/13/2022   26mm S3UR with Dr. Clifton James and Dr. Delia Chimes   Severe aortic stenosis    Stroke [facility]) 1982   pt states he had a mini stroke in 1982    Past Surgical History:  Procedure Laterality Date   ANTERIOR CERVICAL DECOMP/DISCECTOMY FUSION N/A 10/28/2021   Procedure: ACDF - C3-C4 - C4-C5 - C5-C6;  Surgeon: [name], MD;  Location: [facility] OR;  Service: Neurosurgery;  Laterality: N/A;   BACK SURGERY     CARDIOVERSION N/A 04/28/2022   Procedure: CARDIOVERSION;  Surgeon: Jodelle Red, MD;  Location: [facility] ENDOSCOPY;  Service: Cardiovascular;  Laterality: N/A;   CARDIOVERSION N/A 05/31/2022   Procedure: CARDIOVERSION;  Surgeon: Meriam Sprague, MD;  Location: [facility] ENDOSCOPY;  Service: Cardiovascular;  Laterality: N/A;   HERNIA REPAIR     INTRAOPERATIVE TRANSTHORACIC ECHOCARDIOGRAM N/A 07/13/2022   Procedure: INTRAOPERATIVE TRANSTHORACIC ECHOCARDIOGRAM;  Surgeon: Kathleene Hazel, MD;  Location: [facility] INVASIVE CV LAB;   Service: Open Heart Surgery;  Laterality: N/A;   RIGHT/LEFT HEART CATH AND CORONARY ANGIOGRAPHY N/A 04/27/2022   Procedure: RIGHT/LEFT HEART CATH AND CORONARY ANGIOGRAPHY;  Surgeon: Lennette Bihari, MD;  Location: [facility] INVASIVE CV LAB;  Service: Cardiovascular;  Laterality: N/A;   TEE WITHOUT CARDIOVERSION N/A 04/28/2022   Procedure: TRANSESOPHAGEAL ECHOCARDIOGRAM (TEE);  Surgeon: Jodelle Red, MD;  Location: [facility] ENDOSCOPY;  Service: Cardiovascular;  Laterality: N/A;   TRANSCATHETER AORTIC VALVE REPLACEMENT, TRANSFEMORAL N/A 07/13/2022   Procedure: Transcatheter Aortic Valve Replacement, Transfemoral;  Surgeon: Kathleene Hazel, MD;  Location: [facility] INVASIVE CV LAB;  Service: Open Heart Surgery;  Laterality: N/A;    Current Medications: Current Meds  Medication Sig   amiodarone (PACERONE) 100 MG tablet Take 1 tablet (100 mg total) by mouth daily.   Apple Cider Vinegar 500 MG TABS Take 500 mg by mouth at bedtime.   dabigatran (PRADAXA) 150 MG CAPS capsule Take 1 capsule (150 mg total) by mouth 2 (two) times daily.   diclofenac sodium (VOLTAREN) 1 % GEL Apply 1 Application topically 3 (three) times daily as needed (pain).   gabapentin (NEURONTIN) 400 MG capsule Take 400 mg by mouth 3 (three) times daily.   Ginger, Zingiber officinalis, (GINGER PO) Take 500 mg by mouth daily.   glimepiride (AMARYL) 4 MG tablet TAKE ONE TABLET ONCE DAILY WITH BREAKFAST   Lancets (ONETOUCH DELICA PLUS LANCET33G) MISC CHECK BLOOD SUGAR TWICE DAILY AS DIRECTED   levothyroxine (SYNTHROID) 150 MCG tablet TAKE ONE TABLET ONCE DAILY BEFORE BREAKFAST   metFORMIN (GLUCOPHAGE) 1000 MG tablet TAKE ONE TABLET TWICE DAILY WITH FOOD   morphine (MSIR) 15 MG tablet Take 15 mg by mouth every 8 (eight) hours as needed for moderate pain.   ONETOUCH VERIO test strip CHECK BLOOD SUGAR UP TO THREE TIMES DAILY AS DIRECTED   potassium chloride SA (KLOR-CON M) 20 MEQ tablet Take 1 tablet (20 mEq total) by mouth daily.    rosuvastatin (CRESTOR) 10 MG tablet Take 1 tablet (10 mg total) by mouth daily.   torsemide (DEMADEX) 20 MG tablet Take 1 tablet (20 mg total) by mouth every other day.   Turmeric (QC TUMERIC COMPLEX) 500 MG CAPS Take 500 mg by mouth at bedtime.   [DISCONTINUED] amiodarone (PACERONE) 200 MG tablet Take 200 mg by mouth daily.   [DISCONTINUED] apixaban (ELIQUIS) 5 MG TABS tablet Take 1 tablet (5 mg total) by mouth 2 (two) times daily.     Allergies:   Trulicity [dulaglutide],  Januvia [sitagliptin], Other, and Penicillins   Social History   Socioeconomic History   Marital status: Married    Spouse name: Not on file   Number of children: 3   Years of education: Not on file   Highest education level: Not on file  Occupational History   Occupation: Retired from Designer, fashion/clothing, also drove a dump truck  Tobacco Use   Smoking status: Never   Smokeless tobacco: Never  Vaping Use   Vaping status: Never Used  Substance and Sexual Activity   Alcohol use: Never    Comment: occ beer    Drug use: No   Sexual activity: Not on file  Other Topics Concern   Not on file  Social History Narrative   Not on file   Social Determinants of Health   Financial Resource Strain: Low Risk  (03/04/2022)   Overall Financial Resource Strain (CARDIA)    Difficulty of Paying Living Expenses: Not hard at all  Food Insecurity: No Food Insecurity (07/13/2022)   Hunger Vital Sign    Worried About Running Out of Food in the Last Year: Never true    Ran Out of Food in the Last Year: Never true  Transportation Needs: No Transportation Needs (07/13/2022)   PRAPARE - Administrator, Civil Service (Medical): No    Lack of Transportation (Non-Medical): No  Physical Activity: Insufficiently Active (03/04/2022)   Exercise Vital Sign    Days of Exercise per Week: 7 days    Minutes of Exercise per Session: 20 min  Stress: No Stress Concern Present (03/04/2022)   [employer] of Occupational Health -  Occupational Stress Questionnaire    Feeling of Stress : Not at all  Social Connections: Moderately Isolated (03/04/2022)   Social Connection and Isolation Panel [NHANES]    Frequency of Communication with Friends and Family: Once a week    Frequency of Social Gatherings with Friends and Family: More than three times a week    Attends Religious Services: Never    Database administrator or Organizations: No    Attends Engineer, structural: Never    Marital Status: Married     Family History: The patient's family history includes Arthritis in his father and mother; Breast cancer in his sister; Diabetes in his mother; Heart attack in his mother; Hypertension in his mother; Skin cancer in his father.  ROS:   Please see the history of present illness.     All other systems reviewed and are negative.  EKGs/Labs/Other Studies Reviewed:    The following studies were reviewed today:   EKG:     Recent Labs: 04/27/2022: TSH 0.361 07/09/2022: ALT 27 07/14/2022: Magnesium 1.6 07/19/2022: Hemoglobin 13.3; Platelets 206 11/10/2022: BUN 26; Creatinine, Ser 1.66; Potassium 3.8; Sodium 146  Recent Lipid Panel    Component Value Date/Time   CHOL 91 04/27/2022 0334   TRIG 77 04/27/2022 0334   HDL 30 (L) 04/27/2022 0334   CHOLHDL 3.0 04/27/2022 0334   VLDL 15 04/27/2022 0334   LDLCALC 46 04/27/2022 0334   LDLDIRECT 76.0 10/09/2020 0840     Risk Assessment/Calculations:           Physical Exam:    Physical Exam: Blood pressure 118/66, pulse (!) 55, height 6' (1.829 m), weight 224 lb (101.6 kg), SpO2 96%.       GEN:  Well nourished, well developed in no acute distress HEENT: Normal NECK: No JVD; No carotid bruits LYMPHATICS: No lymphadenopathy CARDIAC: RRR soft  systolic murmur. RESPIRATORY:  Clear to auscultation without rales, wheezing or rhonchi  ABDOMEN: Soft, non-tender, non-distended MUSCULOSKELETAL: 1+ bilateral leg edema. SKIN: Warm and dry NEUROLOGIC:  Alert  and oriented x 3     ASSESSMENT:    1. Nonrheumatic aortic valve stenosis   2. Atrial fibrillation with RVR (HCC)   3. Bilateral leg edema        PLAN:      Aortic stenosis:  S//p  TAVR     2.  Acute on chronic combined systolic and diastolic congestive heart failure:  stable .  Avoids salty food.    2.  HLD :    3.  HTN:     Blood pressure is well-controlled.   4.  Chronic leg edema.  Will refer him to [facility] to their venous insufficiency clinic for further advice.  4.  PAF :  on eliquis .   He wants to switch to pradaxa generic.     Medication Adjustments/Labs and Tests Ordered: Current medicines are reviewed at length with the patient today.  Concerns regarding medicines are outlined above.  Orders Placed This Encounter  Procedures   Ambulatory referral to Vascular Surgery   Meds ordered this encounter  Medications   dabigatran (PRADAXA) 150 MG CAPS capsule    Sig: Take 1 capsule (150 mg total) by mouth 2 (two) times daily.    Dispense:  60 capsule    Refill:  10   amiodarone (PACERONE) 100 MG tablet    Sig: Take 1 tablet (100 mg total) by mouth daily.    Dispense:  90 tablet    Refill:  3    DOSE DECREASE       Patient Instructions      Your next appointment:      The format for your next appointment:   In Person  Provider: Dr Elease Hashimoto              Signed, Kristeen Miss, MD  12/27/2022 5:37 PM    [facility]

## 2022-12-27 ENCOUNTER — Ambulatory Visit: Payer: Medicare Other | Attending: Cardiovascular Disease | Admitting: Cardiovascular Disease

## 2022-12-27 ENCOUNTER — Encounter: Payer: Self-pay | Admitting: Cardiovascular Disease

## 2022-12-27 VITALS — BP 118/66 | HR 55 | Ht 72.0 in | Wt 224.0 lb

## 2022-12-27 DIAGNOSIS — I4891 Unspecified atrial fibrillation: Secondary | ICD-10-CM | POA: Diagnosis not present

## 2022-12-27 DIAGNOSIS — R600 Localized edema: Secondary | ICD-10-CM

## 2022-12-27 DIAGNOSIS — I350 Nonrheumatic aortic (valve) stenosis: Secondary | ICD-10-CM | POA: Diagnosis not present

## 2022-12-27 MED ORDER — AMIODARONE HCL 100 MG PO TABS: 100.0000 mg | ORAL_TABLET | Freq: Every day | ORAL | 3 refills | Status: DC

## 2022-12-27 MED ORDER — DABIGATRAN ETEXILATE MESYLATE 150 MG PO CAPS: 150.0000 mg | ORAL_CAPSULE | Freq: Two times a day (BID) | ORAL | 10 refills | Status: DC

## 2022-12-27 NOTE — Patient Instructions (Signed)
Medication Instructions:   STOP TAKING ELIQUIS NOW  START TAKING DABIGATRAN (PRADAXA) 150 MG BY MOUTH TWICE DAILY  DECREASE YOUR AMIODARONE TO 100 MG BY MOUTH DAILY  *If you need a refill on your cardiac medications before your next appointment, please call your pharmacy*    You have been referred to VEIN AND VASCULAR SURGERY FOR CHRONIC LEG EDEMA    Follow-Up: At [facility], you and your health needs are our priority.  As part of our continuing mission to provide you with exceptional heart care, we have created designated Provider Care Teams.  These Care Teams include your primary Cardiologist (physician) and Advanced Practice Providers (APPs -  Physician Assistants and Nurse Practitioners) who all work together to provide you with the care you need, when you need it.  We recommend signing up for the patient portal called "MyChart".  Sign up information is provided on this After Visit Summary.  MyChart is used to connect with patients for Virtual Visits (Telemedicine).  Patients are able to view lab/test results, encounter notes, upcoming appointments, etc.  Non-urgent messages can be sent to your provider as well.   To learn more about what you can do with MyChart, go to [email or web address].    Your next appointment:   1 year(s)  Provider:   DR. Verne Carrow

## 2023-01-03 DIAGNOSIS — M25569 Pain in unspecified knee: Secondary | ICD-10-CM | POA: Diagnosis not present

## 2023-01-03 DIAGNOSIS — M5451 Vertebrogenic low back pain: Secondary | ICD-10-CM | POA: Diagnosis not present

## 2023-01-03 DIAGNOSIS — G894 Chronic pain syndrome: Secondary | ICD-10-CM | POA: Diagnosis not present

## 2023-01-03 DIAGNOSIS — M542 Cervicalgia: Secondary | ICD-10-CM | POA: Diagnosis not present

## 2023-01-03 DIAGNOSIS — M961 Postlaminectomy syndrome, not elsewhere classified: Secondary | ICD-10-CM | POA: Diagnosis not present

## 2023-01-07 ENCOUNTER — Encounter: Payer: Self-pay | Admitting: *Deleted

## 2023-01-07 ENCOUNTER — Other Ambulatory Visit: Payer: Self-pay | Admitting: Family Medicine

## 2023-01-07 NOTE — Progress Notes (Signed)
This patient has an upcoming appointment Please consider obtaining the following lab: Urine albumin/creatinine ratio Thanks!

## 2023-01-10 ENCOUNTER — Telehealth: Payer: Self-pay | Admitting: Pharmacy Technician

## 2023-01-10 ENCOUNTER — Encounter: Payer: Self-pay | Admitting: Family Medicine

## 2023-01-10 ENCOUNTER — Other Ambulatory Visit (HOSPITAL_COMMUNITY): Payer: Self-pay

## 2023-01-10 ENCOUNTER — Ambulatory Visit: Payer: Medicare Other | Admitting: Family Medicine

## 2023-01-10 VITALS — BP 153/66 | HR 49 | Temp 97.2°F | Ht 72.0 in | Wt 227.6 lb

## 2023-01-10 DIAGNOSIS — E1159 Type 2 diabetes mellitus with other circulatory complications: Secondary | ICD-10-CM | POA: Diagnosis not present

## 2023-01-10 DIAGNOSIS — Z23 Encounter for immunization: Secondary | ICD-10-CM

## 2023-01-10 DIAGNOSIS — E1100 Type 2 diabetes mellitus with hyperosmolarity without nonketotic hyperglycemic-hyperosmolar coma (NKHHC): Secondary | ICD-10-CM | POA: Diagnosis not present

## 2023-01-10 DIAGNOSIS — Z7984 Long term (current) use of oral hypoglycemic drugs: Secondary | ICD-10-CM | POA: Diagnosis not present

## 2023-01-10 DIAGNOSIS — I152 Hypertension secondary to endocrine disorders: Secondary | ICD-10-CM | POA: Diagnosis not present

## 2023-01-10 DIAGNOSIS — H6120 Impacted cerumen, unspecified ear: Secondary | ICD-10-CM

## 2023-01-10 LAB — MICROALBUMIN / CREATININE URINE RATIO
Creatinine,U: 32.6 mg/dL
Microalb Creat Ratio: 2.1 mg/g (ref 0.0–30.0)
Microalb, Ur: <0.7 mg/dL (ref 0.0–1.9)

## 2023-01-10 LAB — POCT GLYCOSYLATED HEMOGLOBIN (HGB A1C): Hemoglobin A1C: 7.1 % — AB (ref 4.0–5.6)

## 2023-01-10 MED ORDER — FUROSEMIDE 40 MG PO TABS: 40.0000 mg | ORAL_TABLET | Freq: Every day | ORAL | 0 refills | Status: DC

## 2023-01-10 NOTE — Progress Notes (Signed)
Great news! Urine test is normal. we can recheck in a year.

## 2023-01-10 NOTE — Progress Notes (Signed)
   Thomas [name] is a 67 y.o. male who presents today for an office visit.  Assessment/Plan:  New/Acute Problems: Leg Edema Recently switched to torsemide however having significant issues with leg edema including weeping and bulla formation.  Reassuring lung exam today without any rales or other signs of volume overload. He was previously on Lasix and thinks that he did better with this.  Will go back to his previous dose of 40 mg daily.  Advised him to follow-up with cardiology soon.  Cerumen impaction Had debridement via ENT a couple of years ago and request to be referred back over there.  Will refer today. Previously saw Dr. Suszanne Conners.   Chronic Problems Addressed Today: DM2 (diabetes mellitus, type 2) (HCC) A1c is well-controlled at 6.7.  Will continue metformin 1000 mg twice daily and Amaryl 4 mg daily.  He will continue to work on lifestyle modifications.  We will recheck again in 6 months.  Hypertension associated with diabetes (HCC) Mildly elevated today.  He has been well-controlled at home and previous office visit.  Not currently on any medications.  He will continue to monitor at home and let us know if persistently elevated.  Was previously on lisinopril 10 mg twice daily.  May need to restart if persistently elevated.  Preventative Healthcare - flu shot today.     Subjective:  HPI:  See Assessment / plan for status of chronic conditions. He is here today for diabetes follow up.  He last saw him 6 months ago.  At that time A1c was 6.7 on metformin 1000 mg twice daily and Amaryl 4 mg daily.  He has been doing well with this regimen at home.  Trying to work on cutting down sugar at home.  Since her last visit he has also been following with cardiology.  And underwent aortic valve replacement about 6 months ago.  He has done well postoperatively however recently was switched to torsemide.  Since then he has not felt like this is adequately controlled today edema in his legs and he  has started develop more blisters and swelling.  He was previously on Lasix 40 mg daily and reportedly did well with this.      Objective:  Physical Exam: BP (!) 153/66   Pulse (!) 49   Temp (!) 97.2 F (36.2 C) (Temporal)   Ht 6' (1.829 m)   Wt 227 lb 9.6 oz (103.2 kg)   SpO2 97%   BMI 30.87 kg/m   Gen: No acute distress, resting comfortably CV: Regular rate and rhythm with no murmurs appreciated Pulm: Normal work of breathing, clear to auscultation bilaterally with no crackles, wheezes, or rhonchi MUSCULOSKELETAL: 3+ pitting edema to bilateral legs.  Several tense bulla noted on right lower extremity with 1 ruptured bulla noted.  No surrounding erythema.  Serous drainage present. Neuro: Grossly normal, moves all extremities Psych: Normal affect and thought content      Nguyen Todorov M. Jimmey Ralph, MD 01/10/2023 8:12 AM

## 2023-01-10 NOTE — Assessment & Plan Note (Addendum)
Mildly elevated today.  He has been well-controlled at home and previous office visit.  Not currently on any medications.  He will continue to monitor at home and let us know if persistently elevated.  Was previously on lisinopril 10 mg twice daily.  May need to restart if persistently elevated.

## 2023-01-10 NOTE — Patient Instructions (Signed)
It was very nice to see you today!  Your sugar is at goal today.  Please continue to work on diet and exercise.  I will send in Lasix.  Please discuss with your cardiologist and let them know that the torsemide was causing you issues.  I will refer you to see ENT.  You can call to schedule appointment.  You have seen Dr. Suszanne Conners previously..   Return in about 6 months (around 07/11/2023).   Take care, Dr Jimmey Ralph  PLEASE NOTE:  If you had any lab tests, please let us know if you have not heard back within a few days. You may see your results on mychart before we have a chance to review them but we will give you a call once they are reviewed by [place].   If we ordered any referrals today, please let us know if you have not heard from their office within the next week.   If you had any urgent prescriptions sent in today, please check with the pharmacy within an hour of our visit to make sure the prescription was transmitted appropriately.   Please try these tips to maintain a healthy lifestyle:  Eat at least 3 REAL meals and 1-2 snacks per day.  Aim for no more than 5 hours between eating.  If you eat breakfast, please do so within one hour of getting up.   Each meal should contain half fruits/vegetables, one quarter protein, and one quarter carbs (no bigger than a computer mouse)  Cut down on sweet beverages. This includes juice, soda, and sweet tea.   Drink at least 1 glass of water with each meal and aim for at least 8 glasses per day  Exercise at least 150 minutes every week.

## 2023-01-10 NOTE — Telephone Encounter (Signed)
Pharmacy Patient Advocate Encounter   Received notification from CoverMyMeds that prior authorization for dabigatran is required/requested.   Insurance verification completed.   The patient is insured through [facility] .   Per test claim: PA required; PA submitted to [facility] via CoverMyMeds Key/confirmation #/EOC [id number] Status is pending

## 2023-01-10 NOTE — Assessment & Plan Note (Signed)
A1c is well-controlled at 6.7.  Will continue metformin 1000 mg twice daily and Amaryl 4 mg daily.  He will continue to work on lifestyle modifications.  We will recheck again in 6 months.

## 2023-01-11 ENCOUNTER — Telehealth: Payer: Self-pay

## 2023-01-11 DIAGNOSIS — L539 Erythematous condition, unspecified: Secondary | ICD-10-CM

## 2023-01-11 DIAGNOSIS — S80821A Blister (nonthermal), right lower leg, initial encounter: Secondary | ICD-10-CM

## 2023-01-11 DIAGNOSIS — R600 Localized edema: Secondary | ICD-10-CM

## 2023-01-11 NOTE — Telephone Encounter (Signed)
Received call from Dr Elease Hashimoto stating the patient contacted him (he's out of office this week) with pictures of edema, redness, and fluid blisters on his RLL. Dr Elease Hashimoto would like for pt to see [facility] as soon as can for this. Referral placed at this time and routed to scheduling.

## 2023-01-11 NOTE — Telephone Encounter (Signed)
Pharmacy Patient Advocate Encounter  Received notification from [facility] that Prior Authorization for dabigatran cap has been DENIED.  See denial reason below. No denial letter attached in CMM. Will attache denial letter to Media tab once received.   PA #/Case ID/Reference #: [id number]

## 2023-01-16 ENCOUNTER — Emergency Department (HOSPITAL_COMMUNITY)
Admission: EM | Admit: 2023-01-16 | Discharge: 2023-01-16 | Disposition: A | Discharge status: Home / Self Care | Payer: Medicare Other | Attending: Emergency Medicine | Admitting: Emergency Medicine

## 2023-01-16 ENCOUNTER — Other Ambulatory Visit: Payer: Self-pay

## 2023-01-16 ENCOUNTER — Encounter (HOSPITAL_COMMUNITY): Payer: Self-pay

## 2023-01-16 DIAGNOSIS — N189 Chronic kidney disease, unspecified: Secondary | ICD-10-CM | POA: Diagnosis not present

## 2023-01-16 DIAGNOSIS — I509 Heart failure, unspecified: Secondary | ICD-10-CM | POA: Diagnosis not present

## 2023-01-16 DIAGNOSIS — R600 Localized edema: Secondary | ICD-10-CM | POA: Insufficient documentation

## 2023-01-16 DIAGNOSIS — Z7901 Long term (current) use of anticoagulants: Secondary | ICD-10-CM | POA: Diagnosis not present

## 2023-01-16 DIAGNOSIS — E1122 Type 2 diabetes mellitus with diabetic chronic kidney disease: Secondary | ICD-10-CM | POA: Insufficient documentation

## 2023-01-16 DIAGNOSIS — L03115 Cellulitis of right lower limb: Secondary | ICD-10-CM | POA: Diagnosis not present

## 2023-01-16 DIAGNOSIS — Z7984 Long term (current) use of oral hypoglycemic drugs: Secondary | ICD-10-CM | POA: Insufficient documentation

## 2023-01-16 DIAGNOSIS — M7989 Other specified soft tissue disorders: Secondary | ICD-10-CM | POA: Diagnosis present

## 2023-01-16 LAB — CBC WITH DIFFERENTIAL/PLATELET
Abs Immature Granulocytes: 0.02 10*3/uL (ref 0.00–0.07)
Basophils Absolute: 0.1 10*3/uL (ref 0.0–0.1)
Basophils Relative: 1 %
Eosinophils Absolute: 0.3 10*3/uL (ref 0.0–0.5)
Eosinophils Relative: 3 %
HCT: 43.3 % (ref 39.0–52.0)
Hemoglobin: 14.4 g/dL (ref 13.0–17.0)
Immature Granulocytes: 0 %
Lymphocytes Relative: 29 %
Lymphs Abs: 2.4 10*3/uL (ref 0.7–4.0)
MCH: 30.6 pg (ref 26.0–34.0)
MCHC: 33.3 g/dL (ref 30.0–36.0)
MCV: 92.1 fL (ref 80.0–100.0)
Monocytes Absolute: 0.6 10*3/uL (ref 0.1–1.0)
Monocytes Relative: 7 %
Neutro Abs: 5 10*3/uL (ref 1.7–7.7)
Neutrophils Relative %: 60 %
Platelets: 213 10*3/uL (ref 150–400)
RBC: 4.7 MIL/uL (ref 4.22–5.81)
RDW: 13.1 % (ref 11.5–15.5)
WBC: 8.3 10*3/uL (ref 4.0–10.5)
nRBC: 0 % (ref 0.0–0.2)

## 2023-01-16 LAB — BASIC METABOLIC PANEL
Anion gap: 9 (ref 5–15)
BUN: 20 mg/dL (ref 8–23)
CO2: 28 mmol/L (ref 22–32)
Calcium: 8.6 mg/dL — ABNORMAL LOW (ref 8.9–10.3)
Chloride: 103 mmol/L (ref 98–111)
Creatinine, Ser: 1.39 mg/dL — ABNORMAL HIGH (ref 0.61–1.24)
GFR, Estimated: 55 mL/min — ABNORMAL LOW (ref >60)
Glucose, Bld: 113 mg/dL — ABNORMAL HIGH (ref 70–99)
Potassium: 3.7 mmol/L (ref 3.5–5.1)
Sodium: 140 mmol/L (ref 135–145)

## 2023-01-16 MED ORDER — CEPHALEXIN 500 MG PO CAPS: 500.0000 mg | ORAL_CAPSULE | Freq: Four times a day (QID) | ORAL | 0 refills | Status: AC

## 2023-01-16 MED ORDER — CEFAZOLIN SODIUM-DEXTROSE 1-4 GM/50ML-% IV SOLN
2.0000 g | Freq: Once | INTRAVENOUS | Status: AC
  Administered 2023-01-16: 1 g via INTRAVENOUS
  Filled 2023-01-16 (×2): qty 100

## 2023-01-16 NOTE — Discharge Instructions (Addendum)
You were seen in the emergency department today for leg redness swelling and drainage.  We are going to start you on antibiotics.  You got your first dose in the ER through the IV.  You need to keep these wounds clean and dry.  Come back if you have new or worsening  symptoms.  I have ordered an outpatient ultrasound but will rule out out a possible blood clot.  Please can call tomorrow to schedule appointment for this week in next 1 to 2 days.  Please increase your Lasix to 60 mg/day for the next 5 days.  Follow-up with your cardiologist closely.  You can call them tomorrow to update them regarding today's visit.

## 2023-01-16 NOTE — ED Triage Notes (Signed)
C/o right leg pain with swelling, blisters and drainage x2 months.  Pt reports this started after switched from lasix to torsemide.  Hx DM

## 2023-01-16 NOTE — ED Provider Triage Note (Signed)
Emergency Medicine Provider Triage Evaluation Note  Thomas [name] , a 67 y.o. male  was evaluated in triage.  Pt complains of right leg swelling redness and blisters x 2 weeks gradually worsening.  He does have history of CHF and is on diuretics. He denies fever/chills.  Review of Systems  Positive: As above Negative: As above  Physical Exam  BP (!) 156/72   Pulse (!) 57   Temp 99.1 F (37.3 C) (Oral)   Resp 18   Wt 107.5 kg   SpO2 97%   BMI 32.14 kg/m  Gen:   Awake, no distress   Resp:  Normal effort  MSK:   Moves extremities without difficulty  Other:    Medical Decision Making  Medically screening exam initiated at 5:43 PM.  Appropriate orders placed.  Thomas [name] was informed that the remainder of the evaluation will be completed by another provider, this initial triage assessment does not replace that evaluation, and the importance of remaining in the ED until their evaluation is complete.     Ma Rings, [place] 01/16/23 1744

## 2023-01-16 NOTE — ED Provider Notes (Signed)
[facility] EMERGENCY DEPARTMENT AT [facility] Provider Note   CSN: [id number] Arrival date & time: 01/16/23  1639     History  Chief Complaint  Patient presents with   Leg Swelling    Thomas [name] is a 67 y.o. male.  PMH of congestive heart failure, diabetes, CKD.  Presents to the ER today complaining of 2 weeks of right lower extremity swelling redness and drainage.  He states this started when he switched from Lasix to torsemide.  He has switched himself back to 640 mg daily.  He is having drainage.  He called his cardiologist and they are trying to set up of an ultrasound which has not been done yet.  Denies fever or chills, no nausea or vomiting.  Denies pain or injury.  HPI     Home Medications Prior to Admission medications   Medication Sig Start Date End Date Taking? Authorizing Provider  cephALEXin (KEFLEX) 500 MG capsule Take 1 capsule (500 mg total) by mouth 4 (four) times daily for 7 days. 01/16/23 01/23/23 Yes Ranon Coven A, PA-C  amiodarone (PACERONE) 100 MG tablet Take 1 tablet (100 mg total) by mouth daily. 12/27/22   Nahser, Deloris Ping, MD  Apple Cider Vinegar 500 MG TABS Take 500 mg by mouth at bedtime.    [provider]  dabigatran (PRADAXA) 150 MG CAPS capsule Take 1 capsule (150 mg total) by mouth 2 (two) times daily. 12/27/22   Nahser, Deloris Ping, MD  diclofenac sodium (VOLTAREN) 1 % GEL Apply 1 Application topically 3 (three) times daily as needed (pain). 05/25/17   [provider]  furosemide (LASIX) 40 MG tablet Take 1 tablet (40 mg total) by mouth daily. 01/10/23   Ardith Dark, MD  gabapentin (NEURONTIN) 400 MG capsule Take 400 mg by mouth 3 (three) times daily.    [provider]  Ginger, Zingiber officinalis, (GINGER PO) Take 500 mg by mouth daily.    [provider]  glimepiride (AMARYL) 4 MG tablet TAKE ONE TABLET ONCE DAILY WITH BREAKFAST 12/13/22   Ardith Dark, MD  Lancets [facility] DELICA PLUS  [place]) MISC CHECK BLOOD SUGAR TWICE DAILY AS DIRECTED 11/16/22   Ardith Dark, MD  levothyroxine (SYNTHROID) 150 MCG tablet TAKE ONE TABLET ONCE DAILY BEFORE BREAKFAST 01/07/23   Ardith Dark, MD  metFORMIN (GLUCOPHAGE) 1000 MG tablet TAKE ONE TABLET TWICE DAILY WITH FOOD 12/13/22   Ardith Dark, MD  morphine (MSIR) 15 MG tablet Take 15 mg by mouth every 8 (eight) hours as needed for moderate pain. 05/18/22   [provider]  ONETOUCH VERIO test strip CHECK BLOOD SUGAR UP TO THREE TIMES DAILY AS DIRECTED 05/13/22   Ardith Dark, MD  potassium chloride SA (KLOR-CON M) 20 MEQ tablet Take 1 tablet (20 mEq total) by mouth daily. 08/19/22   Georgie Chard D, NP  rosuvastatin (CRESTOR) 10 MG tablet Take 1 tablet (10 mg total) by mouth daily. 05/12/22   Perlie Gold, PA-C  torsemide (DEMADEX) 20 MG tablet Take 1 tablet (20 mg total) by mouth every other day. 10/21/22   Filbert Schilder, NP  Turmeric (QC TUMERIC COMPLEX) 500 MG CAPS Take 500 mg by mouth at bedtime.    [provider]      Allergies    Trulicity [dulaglutide], Januvia [sitagliptin], Other, and Penicillins    Review of Systems   Review of Systems  Physical Exam Updated Vital Signs BP (!) 156/72   Pulse Marland Kitchen)  57   Temp 99.1 F (37.3 C) (Oral)   Resp 18   Wt 107.5 kg   SpO2 97%   BMI 32.14 kg/m  Physical Exam Vitals and nursing note reviewed.  Constitutional:      General: He is not in acute distress.    Appearance: He is well-developed.  HENT:     Head: Normocephalic and atraumatic.  Eyes:     Conjunctiva/sclera: Conjunctivae normal.  Cardiovascular:     Rate and Rhythm: Normal rate and regular rhythm.     Heart sounds: No murmur heard. Pulmonary:     Effort: Pulmonary effort is normal. No respiratory distress.     Breath sounds: Normal breath sounds.  Abdominal:     Palpations: Abdomen is soft.     Tenderness: There is no abdominal tenderness.  Musculoskeletal:        General: No  swelling.     Cervical back: Neck supple.     Right lower leg: Edema present.     Left lower leg: Edema present.     Comments: DP and PT pulses intact in right foot.  Right leg with swelling greater than left  Skin:    General: Skin is warm and dry.     Capillary Refill: Capillary refill takes less than 2 seconds.     Comments: Right lower extremity with multiple bulla intact and a couple of ruptured bulla with serous drainage.  No purulent drainage, there is erythema to the lateral aspect of the leg with warmth.  Neurological:     General: No focal deficit present.     Mental Status: He is alert and oriented to person, place, and time.  Psychiatric:        Mood and Affect: Mood normal.     ED Results / Procedures / Treatments   Labs (all labs ordered are listed, but only abnormal results are displayed) Labs Reviewed  BASIC METABOLIC PANEL - Abnormal; Notable for the following components:      Result Value   Glucose, Bld 113 (*)    Creatinine, Ser 1.39 (*)    Calcium 8.6 (*)    GFR, Estimated 55 (*)    All other components within normal limits  CBC WITH DIFFERENTIAL/PLATELET    EKG None  Radiology No results found.  Procedures Procedures    Medications Ordered in ED Medications  ceFAZolin (ANCEF) IVPB 1 g/50 mL premix (1 g Intravenous New Bag/Given 01/16/23 2058)    ED Course/ Medical Decision Making/ A&P Clinical Course as of 01/16/23 2126  Sun Jan 16, 2023  1838 Basic metabolic panel(!) [CB]    Clinical Course User Index [CB] Ma Rings, [place]                                 Medical Decision Making This patient presents to the ED for concern of left leg swelling and drainage, this involves an extensive number of treatment options, and is a complaint that carries with it a high risk of complications and morbidity.  The differential diagnosis includes cellulitis, DVT, venous stasis, other    Additional history obtained:  Additional history obtained  from EMR External records from outside source obtained and reviewed including note, labs, prior meds   Lab Tests:  I Ordered, and personally interpreted labs.  The pertinent results include: Renal function but I the patient's usual baseline, no anemia, no leukocytosis, no electrolyte derangements  Problem List / ED Course / Critical interventions / Medication management  With right lower extremity swelling and redness.  Overall low suspicion for DVT since he is already on Eliquis but will order ultrasound as this was planned from cardiology as well.  This appears to be cellulitic.  Will treat with cephalosporin which she is tolerated in the past per reports.  He is already on Eliquis will not empirically anticoagulate any further.  Patient given instructions on follow-up for outpatient ultrasound Is on increasing Lasix to 60 mg a day for the next 5 days and close cardiology follow-up. I have reviewed the patients home medicines and have made adjustments as needed    Test / Admission - Considered:  Considered and offered admission for cellulitis as patient is diabetic, has significant edema and discussed that this would potentially decrease the antibiotic penetration to the skin.  Patient would prefer to trial outpatient antibiotics first    Amount and/or Complexity of Data Reviewed Labs: ordered. Decision-making details documented in ED Course.  Risk Prescription drug management.           Final Clinical Impression(s) / ED Diagnoses Final diagnoses:  Cellulitis of right lower extremity    Rx / DC Orders ED Discharge Orders          Ordered    Lower Ext Right Venous US       Comments: IMPORTANT PATIENT INSTRUCTIONS:  Your ED provider has recommended an Outpatient Ultrasound.  Please call [phone] to schedule an appointment.  If your appointment is scheduled for a Saturday, Sunday or holiday, please go to the [facility] Registration Desk  at least 15 minutes prior to your appointment time and tell them you are there for an ultrasound.    If your appointment is scheduled for a weekday (Monday-Friday), please go directly to the  Radiology Department at least 15 minutes prior to your appointment time and tell them you are there for an ultrasound.  Please call [phone] with questions.   01/16/23 1918    cephALEXin (KEFLEX) 500 MG capsule  4 times daily        10 /20/24 1919              Josem Kaufmann 01/16/23 2126    Eber Hong, MD 01/17/23 [phone]

## 2023-01-17 ENCOUNTER — Ambulatory Visit (HOSPITAL_COMMUNITY)
Admission: RE | Admit: 2023-01-17 | Discharge: 2023-01-17 | Disposition: A | Discharge status: Home / Self Care | Payer: Medicare Other | Source: Ambulatory Visit | Attending: Physician Assistant | Admitting: Physician Assistant

## 2023-01-17 ENCOUNTER — Telehealth: Payer: Self-pay | Admitting: Cardiovascular Disease

## 2023-01-17 DIAGNOSIS — M7989 Other specified soft tissue disorders: Secondary | ICD-10-CM | POA: Diagnosis not present

## 2023-01-17 DIAGNOSIS — L03115 Cellulitis of right lower limb: Secondary | ICD-10-CM | POA: Insufficient documentation

## 2023-01-17 NOTE — Telephone Encounter (Signed)
Pt c/o swelling: STAT is pt has developed SOB within 24 hours  How much weight have you gained and in what time span? Since he had a procedure   If swelling, where is the swelling located?Legs  Are you currently taking a fluid pill? Yes  Are you currently SOB? No  Do you have a log of your daily weights (if so, list)? No Have you gained 3 pounds in a day or 5 pounds in a week? No  Have you traveled recently? No  Patient's wife is calling because the patient is having swelling in his legs. Patient's wife stated the patient was in the ED for the swelling yesterday and the patient has blisters on his legs from the swelling. Please advise.

## 2023-01-18 ENCOUNTER — Other Ambulatory Visit: Payer: Self-pay

## 2023-01-18 MED ORDER — DABIGATRAN ETEXILATE MESYLATE 150 MG PO CAPS: 150.0000 mg | ORAL_CAPSULE | Freq: Two times a day (BID) | ORAL | 10 refills | Status: DC

## 2023-01-18 NOTE — Progress Notes (Signed)
Per Dr Elease Hashimoto, received text from daughter while in clinic and pt's pharmacy in [place] cannot/will not fill. Daughter is requesting new rx get sent to [facility] in [place] so that it can be filled there. Prescription sent at this time and pt made aware via MyChart.

## 2023-01-19 ENCOUNTER — Other Ambulatory Visit: Payer: Self-pay

## 2023-01-19 ENCOUNTER — Encounter (INDEPENDENT_AMBULATORY_CARE_PROVIDER_SITE_OTHER): Payer: Self-pay

## 2023-01-19 ENCOUNTER — Ambulatory Visit (INDEPENDENT_AMBULATORY_CARE_PROVIDER_SITE_OTHER): Payer: Medicare Other | Admitting: Otolaryngology

## 2023-01-19 VITALS — Ht 72.0 in | Wt 227.0 lb

## 2023-01-19 DIAGNOSIS — H6123 Impacted cerumen, bilateral: Secondary | ICD-10-CM | POA: Diagnosis not present

## 2023-01-19 DIAGNOSIS — I872 Venous insufficiency (chronic) (peripheral): Secondary | ICD-10-CM

## 2023-01-20 ENCOUNTER — Ambulatory Visit (HOSPITAL_COMMUNITY)
Admission: RE | Admit: 2023-01-20 | Discharge: 2023-01-20 | Disposition: A | Discharge status: Home / Self Care | Payer: Medicare Other | Source: Ambulatory Visit | Attending: Vascular Surgery | Admitting: Vascular Surgery

## 2023-01-20 ENCOUNTER — Ambulatory Visit: Payer: Medicare Other

## 2023-01-20 VITALS — BP 166/78 | HR 54 | Temp 97.7°F | Ht <= 58 in | Wt 229.8 lb

## 2023-01-20 DIAGNOSIS — R600 Localized edema: Secondary | ICD-10-CM

## 2023-01-20 DIAGNOSIS — I872 Venous insufficiency (chronic) (peripheral): Secondary | ICD-10-CM

## 2023-01-20 DIAGNOSIS — M7989 Other specified soft tissue disorders: Secondary | ICD-10-CM

## 2023-01-20 DIAGNOSIS — L97919 Non-pressure chronic ulcer of unspecified part of right lower leg with unspecified severity: Secondary | ICD-10-CM | POA: Diagnosis not present

## 2023-01-20 NOTE — Progress Notes (Signed)
Requested by:  Vesta Mixer, MD [address] [place],  [place] [address]  Reason for consultation: edema, rubor, blistering, weeping of RLE    History of Present Illness   Thomas [name] is a 67 y.o. ([birth year]) male who presents with his wife for evaluation of RLE edema with rubor, blistering and weeping. He was sent at request of his Cardiologist, Dr. Elease Hashimoto. He explains that he developed lower extremity edema after his TAVR in April of this year. Overall his swelling was managed on Furosemide. However over past 2-3 weeks after switching from Lasix to Torsemide he developed significant increase in swelling in his legs R> L and developed multiple blisters. These subsequently ruptured and have left some ulcerations on his leg. He presented to ER on 01/16/23 due to the increased redness, swelling, drainage and ulcers.  He was given Rx for Keflex at ED for cellulitis. He had duplex that was negative for DVT. He has very minimal pain in his right leg. He denies any pain on ambulation or pain that wakes him up from sleep. Mostly he has some discomfort in area of ulcers. He does not have any aching, throbbing, stinging, burning, itching at baseline. He does elevate regularly but this has not greatly improved the swelling. He says he has tried compression in the past with minimal improvement and not really able to tolerate because of where the stockings hit his knees.   He is on Amio and Pradaxa for atrial fibrillation and hx aortic valve replacement. He also has CHF.  He is Diabetic. He takes Statin for HLD. He also has known CKD. He has also previously undergone multiple back surgeries which limit his mobility.   Venous symptoms include: swelling, ulceration Onset/duration:  > 2-3 weeks  Occupation:  retired Aggravating factors: sitting, standing Alleviating factors: elevation Compression:  no Helps:  no Pain medications:  On chronic pain medication for his back Previous vein  procedures:  no History of DVT:  no  Past Medical History:  Diagnosis Date   Cervical myelopathy (HCC)    Diabetes mellitus without complication (HCC)    Hypercholesterolemia    Hypertension    S/P TAVR (transcatheter aortic valve replacement) 07/13/2022   26mm S3UR with Dr. Clifton James and Dr. Delia Chimes   Severe aortic stenosis    Stroke [facility]) 1982   pt states he had a mini stroke in 1982    Past Surgical History:  Procedure Laterality Date   ANTERIOR CERVICAL DECOMP/DISCECTOMY FUSION N/A 10/28/2021   Procedure: ACDF - C3-C4 - C4-C5 - C5-C6;  Surgeon: [name], MD;  Location: [facility] OR;  Service: Neurosurgery;  Laterality: N/A;   BACK SURGERY     CARDIOVERSION N/A 04/28/2022   Procedure: CARDIOVERSION;  Surgeon: Jodelle Red, MD;  Location: [name] ENDOSCOPY;  Service: Cardiovascular;  Laterality: N/A;   CARDIOVERSION N/A 05/31/2022   Procedure: CARDIOVERSION;  Surgeon: Meriam Sprague, MD;  Location: [facility] ENDOSCOPY;  Service: Cardiovascular;  Laterality: N/A;   HERNIA REPAIR     INTRAOPERATIVE TRANSTHORACIC ECHOCARDIOGRAM N/A 07/13/2022   Procedure: INTRAOPERATIVE TRANSTHORACIC ECHOCARDIOGRAM;  Surgeon: Kathleene Hazel, MD;  Location: [facility] INVASIVE CV LAB;  Service: Open Heart Surgery;  Laterality: N/A;   RIGHT/LEFT HEART CATH AND CORONARY ANGIOGRAPHY N/A 04/27/2022   Procedure: RIGHT/LEFT HEART CATH AND CORONARY ANGIOGRAPHY;  Surgeon: Lennette Bihari, MD;  Location: [facility] INVASIVE CV LAB;  Service: Cardiovascular;  Laterality: N/A;   TEE WITHOUT CARDIOVERSION N/A 04/28/2022   Procedure: TRANSESOPHAGEAL ECHOCARDIOGRAM (  TEE);  Surgeon: Jodelle Red, MD;  Location: [facility] ENDOSCOPY;  Service: Cardiovascular;  Laterality: N/A;   TRANSCATHETER AORTIC VALVE REPLACEMENT, TRANSFEMORAL N/A 07/13/2022   Procedure: Transcatheter Aortic Valve Replacement, Transfemoral;  Surgeon: Kathleene Hazel, MD;  Location: [facility] INVASIVE CV LAB;  Service: Open Heart Surgery;  Laterality: N/A;     Social History   Socioeconomic History   Marital status: Married    Spouse name: Not on file   Number of children: 3   Years of education: Not on file   Highest education level: 11th grade  Occupational History   Occupation: Retired from Designer, fashion/clothing, also drove a dump truck  Tobacco Use   Smoking status: Never   Smokeless tobacco: Never  Vaping Use   Vaping status: Never Used  Substance and Sexual Activity   Alcohol use: Never    Comment: occ beer    Drug use: No   Sexual activity: Not on file  Other Topics Concern   Not on file  Social History Narrative   Not on file   Social Determinants of Health   Financial Resource Strain: Patient Declined (01/06/2023)   Overall Financial Resource Strain (CARDIA)    Difficulty of Paying Living Expenses: Patient declined  Food Insecurity: Unknown (01/06/2023)   Hunger Vital Sign    Worried About Running Out of Food in the Last Year: Never true    Ran Out of Food in the Last Year: Patient declined  Transportation Needs: No Transportation Needs (01/06/2023)   PRAPARE - Administrator, Civil Service (Medical): No    Lack of Transportation (Non-Medical): No  Physical Activity: Unknown (01/06/2023)   Exercise Vital Sign    Days of Exercise per Week: Patient declined    Minutes of Exercise per Session: 20 min  Stress: No Stress Concern Present (01/06/2023)   [name] of Occupational Health - Occupational Stress Questionnaire    Feeling of Stress : Not at all  Social Connections: Unknown (01/06/2023)   Social Connection and Isolation Panel [NHANES]    Frequency of Communication with Friends and Family: Three times a week    Frequency of Social Gatherings with Friends and Family: Three times a week    Attends Religious Services: Patient declined    Active Member of Clubs or Organizations: No    Attends Banker Meetings: Never    Marital Status: Married  Catering manager Violence: Not At Risk  (07/13/2022)   Humiliation, Afraid, Rape, and Kick questionnaire    Fear of Current or Ex-Partner: No    Emotionally Abused: No    Physically Abused: No    Sexually Abused: No    Family History  Problem Relation Age of Onset   Diabetes Mother    Heart attack Mother    Hypertension Mother    Arthritis Mother    Skin cancer Father    Arthritis Father    Breast cancer Sister     Current Outpatient Medications  Medication Sig Dispense Refill   amiodarone (PACERONE) 100 MG tablet Take 1 tablet (100 mg total) by mouth daily. 90 tablet 3   Apple Cider Vinegar 500 MG TABS Take 500 mg by mouth at bedtime.     cephALEXin (KEFLEX) 500 MG capsule Take 1 capsule (500 mg total) by mouth 4 (four) times daily for 7 days. 28 capsule 0   dabigatran (PRADAXA) 150 MG CAPS capsule Take 1 capsule (150 mg total) by mouth 2 (two) times daily. 60 capsule 10  diclofenac sodium (VOLTAREN) 1 % GEL Apply 1 Application topically 3 (three) times daily as needed (pain).  4   furosemide (LASIX) 40 MG tablet Take 1 tablet (40 mg total) by mouth daily. 30 tablet 0   gabapentin (NEURONTIN) 400 MG capsule Take 400 mg by mouth 3 (three) times daily.     Ginger, Zingiber officinalis, (GINGER PO) Take 500 mg by mouth daily.     glimepiride (AMARYL) 4 MG tablet TAKE ONE TABLET ONCE DAILY WITH BREAKFAST 90 tablet 0   Lancets (ONETOUCH DELICA PLUS LANCET33G) MISC CHECK BLOOD SUGAR TWICE DAILY AS DIRECTED 100 each 2   levothyroxine (SYNTHROID) 150 MCG tablet TAKE ONE TABLET ONCE DAILY BEFORE BREAKFAST 90 tablet 0   metFORMIN (GLUCOPHAGE) 1000 MG tablet TAKE ONE TABLET TWICE DAILY WITH FOOD 180 tablet 0   morphine (MSIR) 15 MG tablet Take 15 mg by mouth every 8 (eight) hours as needed for moderate pain.     ONETOUCH VERIO test strip CHECK BLOOD SUGAR UP TO THREE TIMES DAILY AS DIRECTED 100 strip 12   potassium chloride SA (KLOR-CON M) 20 MEQ tablet Take 1 tablet (20 mEq total) by mouth daily. 60 tablet 1   rosuvastatin  (CRESTOR) 10 MG tablet Take 1 tablet (10 mg total) by mouth daily. 90 tablet 3   torsemide (DEMADEX) 20 MG tablet Take 1 tablet (20 mg total) by mouth every other day.     Turmeric (QC TUMERIC COMPLEX) 500 MG CAPS Take 500 mg by mouth at bedtime.     No current facility-administered medications for this visit.    Allergies  Allergen Reactions   Trulicity [Dulaglutide] Hives   Januvia [Sitagliptin] Other (See Comments)    Red eyes   Other     Stomach medicine, not sure of name. Had a mini stroke   Penicillins Swelling    Arm swelling Has patient had a PCN reaction causing immediate rash, facial/tongue/throat swelling, SOB or lightheadedness with hypotension: No Has patient had a PCN reaction causing severe rash involving mucus membranes or skin necrosis: No Has patient had a PCN reaction that required hospitalization No Has patient had a PCN reaction occurring within the last 10 years: No If all of the above answers are "NO", then may proceed with Cephalosporin use.    REVIEW OF SYSTEMS (negative unless checked):   Cardiac:  []  Chest pain or chest pressure? []  Shortness of breath upon activity? []  Shortness of breath when lying flat? []  Irregular heart rhythm?  Vascular:  []  Pain in calf, thigh, or hip brought on by walking? []  Pain in feet at night that wakes you up from your sleep? []  Blood clot in your veins? [x]  Leg swelling?  Pulmonary:  []  Oxygen at home? []  Productive cough? []  Wheezing?  Neurologic:  []  Sudden weakness in arms or legs? []  Sudden numbness in arms or legs? []  Sudden onset of difficult speaking or slurred speech? []  Temporary loss of vision in one eye? []  Problems with dizziness?  Gastrointestinal:  []  Blood in stool? []  Vomited blood?  Genitourinary:  []  Burning when urinating? []  Blood in urine?  Psychiatric:  []  Major depression  Hematologic:  []  Bleeding problems? []  Problems with blood clotting?  Dermatologic:  []  Rashes or  ulcers?  Constitutional:  []  Fever or chills?  Ear/Nose/Throat:  []  Change in hearing? []  Nose bleeds? []  Sore throat?  Musculoskeletal:  []  Back pain? []  Joint pain? []  Muscle pain?   Physical Examination     Vitals:  01/20/23 1312  BP: (!) 166/78  Pulse: (!) 54  Temp: 97.7 F (36.5 C)  SpO2: 90%  Weight: 229 lb 12.8 oz (104.2 kg)  Height: (!) 6" (0.152 m)   Body mass index is 4,487.97 kg/m.  General:  WDWN in NAD; vital signs documented above Gait: Normal, ambulates with cane HENT: WNL, normocephalic Pulmonary: normal non-labored breathing , without wheezing Cardiac: regular HR Abdomen: soft Vascular Exam/Pulses:2+ femoral, PT and DP pulses bilaterally Extremities: without varicose veins, without reticular veins, with  pitting edema right > Left , without stasis pigmentation, without lipodermatosclerosis, with ulcers as shown below   Left medial and posterior leg with some small ulcerations and weeping  Right anterior, lateral and posterior ulcerations with weeping; surrounding erythema present Musculoskeletal: no muscle wasting or atrophy  Neurologic: A&O X 3;  No focal weakness or paresthesias are detected Psychiatric:  The pt has Normal affect.  Non-invasive Vascular Imaging   BLE Venous Insufficiency Duplex (01/20/23):  RLE:  No DVT and SVT,  No GSV reflux GSV diameter 0.30-0.41 cm No SSV reflux  Popliteal deep venous reflux   Medical Decision Making   HANNAN MCNAMAR is a 67 y.o. male who presents with: RLE chronic venous insufficiency with edema and ulceration. His duplex today shows no DVT or SVT. He does not have any superficial venous insufficiency. He only has deep reflux in the popliteal vein. I suspect though that with his significant edema in his right leg that his reflux is not truly represented on his duplex today with his legs being non compressible. He would benefit from repeat evaluation in the future once some resolution of the  ulcerations and edema. I do think however that a lot of his edema is likely multifactorial in setting of CHF, CKD, limited mobility. He is maintained on Lasix per Dr. Elease Hashimoto.  Based on the patient's history and examination, I recommend: daily elevation of 20-30 minutes above level of heart, daily compression stocking use, exercise, weight reduction, refraining from prolonged sitting or standing. I discussed with the patient the use of  15--20 mm thigh knee high compression stockings on the LLE and eventually will transition to use in the right leg once wounds have resolved Continue course of Keflex  Recommend placement of Unna Boot on RLE for 1 week He will follow up in 1 week for Thomas [name], Thomas [name] change and wound check   Thomas Congress, PA-C [facility] Office: [phone]  01/20/2023, 3:42 PM  Clinic MD: Karin Lieu

## 2023-01-23 DIAGNOSIS — H6123 Impacted cerumen, bilateral: Secondary | ICD-10-CM | POA: Insufficient documentation

## 2023-01-23 NOTE — Progress Notes (Signed)
Patient ID: Thomas [name], male   DOB: Jul 23, 1955, 67 y.o.   MRN: [id number]  Procedure: Bilateral cerumen disimpaction.   Indication: Cerumen impaction, resulting in ear discomfort and conductive hearing loss.   Description: The patient is placed supine on the operating table. Under the operating microscope, the right ear canal is examined and is noted to be impacted with cerumen. The cerumen is carefully removed with a combination of suction catheters, cerumen curette, and alligator forceps. After the cerumen removal, the ear canal and tympanic membrane are noted to be normal. No middle ear effusion is noted. The same procedure is then repeated on the left side without exception. The patient tolerated the procedure well.  Follow-up care:  The patient is instructed not to use Q-tips to clean the ear canals. The patient will follow up as needed.

## 2023-01-28 ENCOUNTER — Ambulatory Visit: Payer: Medicare Other | Admitting: Physician Assistant

## 2023-01-28 VITALS — BP 167/76 | HR 64 | Temp 98.0°F | Resp 18 | Wt 229.0 lb

## 2023-01-28 DIAGNOSIS — I872 Venous insufficiency (chronic) (peripheral): Secondary | ICD-10-CM | POA: Diagnosis not present

## 2023-01-28 DIAGNOSIS — L97919 Non-pressure chronic ulcer of unspecified part of right lower leg with unspecified severity: Secondary | ICD-10-CM

## 2023-01-28 DIAGNOSIS — I83029 Varicose veins of left lower extremity with ulcer of unspecified site: Secondary | ICD-10-CM

## 2023-01-28 DIAGNOSIS — L97929 Non-pressure chronic ulcer of unspecified part of left lower leg with unspecified severity: Secondary | ICD-10-CM | POA: Diagnosis not present

## 2023-01-28 NOTE — Progress Notes (Signed)
Office Note     CC:  follow up Requesting Provider:  Ardith Dark, MD  HPI: Thomas [name] is a 67 y.o. ([birth year]) male who presents for evaluation of venous ulcerations.  He was seen last week for the same and placed in an Unna boot on his right lower extremity.  He believes the wounds are improving.  Patient however states he has noticed blistering and weeping on the contralateral lower leg.  He denies any fevers, chills, nausea/vomiting.  Patient states his daughter is a Engineer, civil (consulting) and is able to perform weekly Unna boot changes.  He denies tobacco use.  He is not a candidate for laser saphenous vein ablation on the right lower extremity.  He also denies claudication, rest pain, and tissue loss on the feet.   Past Medical History:  Diagnosis Date   Cervical myelopathy (HCC)    Diabetes mellitus without complication (HCC)    Hypercholesterolemia    Hypertension    S/P TAVR (transcatheter aortic valve replacement) 07/13/2022   26mm S3UR with Dr. Clifton James and Dr. Delia Chimes   Severe aortic stenosis    Stroke [facility]) 1982   pt states he had a mini stroke in 1982    Past Surgical History:  Procedure Laterality Date   ANTERIOR CERVICAL DECOMP/DISCECTOMY FUSION N/A 10/28/2021   Procedure: ACDF - C3-C4 - C4-C5 - C5-C6;  Surgeon: [name], MD;  Location: [facility] OR;  Service: Neurosurgery;  Laterality: N/A;   BACK SURGERY     CARDIOVERSION N/A 04/28/2022   Procedure: CARDIOVERSION;  Surgeon: Jodelle Red, MD;  Location: [facility] ENDOSCOPY;  Service: Cardiovascular;  Laterality: N/A;   CARDIOVERSION N/A 05/31/2022   Procedure: CARDIOVERSION;  Surgeon: Meriam Sprague, MD;  Location: [facility] ENDOSCOPY;  Service: Cardiovascular;  Laterality: N/A;   HERNIA REPAIR     INTRAOPERATIVE TRANSTHORACIC ECHOCARDIOGRAM N/A 07/13/2022   Procedure: INTRAOPERATIVE TRANSTHORACIC ECHOCARDIOGRAM;  Surgeon: Kathleene Hazel, MD;  Location: [facility] INVASIVE CV LAB;  Service: Open Heart Surgery;  Laterality: N/A;    RIGHT/LEFT HEART CATH AND CORONARY ANGIOGRAPHY N/A 04/27/2022   Procedure: RIGHT/LEFT HEART CATH AND CORONARY ANGIOGRAPHY;  Surgeon: Lennette Bihari, MD;  Location: [facility] INVASIVE CV LAB;  Service: Cardiovascular;  Laterality: N/A;   TEE WITHOUT CARDIOVERSION N/A 04/28/2022   Procedure: TRANSESOPHAGEAL ECHOCARDIOGRAM (TEE);  Surgeon: Jodelle Red, MD;  Location: [facility] ENDOSCOPY;  Service: Cardiovascular;  Laterality: N/A;   TRANSCATHETER AORTIC VALVE REPLACEMENT, TRANSFEMORAL N/A 07/13/2022   Procedure: Transcatheter Aortic Valve Replacement, Transfemoral;  Surgeon: Kathleene Hazel, MD;  Location: [facility] INVASIVE CV LAB;  Service: Open Heart Surgery;  Laterality: N/A;    Social History   Socioeconomic History   Marital status: Married    Spouse name: Not on file   Number of children: 3   Years of education: Not on file   Highest education level: 11th grade  Occupational History   Occupation: Retired from Designer, fashion/clothing, also drove a dump truck  Tobacco Use   Smoking status: Never   Smokeless tobacco: Never  Vaping Use   Vaping status: Never Used  Substance and Sexual Activity   Alcohol use: Never    Comment: occ beer    Drug use: No   Sexual activity: Not on file  Other Topics Concern   Not on file  Social History Narrative   Not on file   Social Determinants of Health   Financial Resource Strain: Patient Declined (01/06/2023)   Overall Financial Resource Strain (CARDIA)    Difficulty of Paying Living  Expenses: Patient declined  Food Insecurity: Unknown (01/06/2023)   Hunger Vital Sign    Worried About Running Out of Food in the Last Year: Never true    Ran Out of Food in the Last Year: Patient declined  Transportation Needs: No Transportation Needs (01/06/2023)   PRAPARE - Administrator, Civil Service (Medical): No    Lack of Transportation (Non-Medical): No  Physical Activity: Unknown (01/06/2023)   Exercise Vital Sign    Days of Exercise per Week: Patient  declined    Minutes of Exercise per Session: 20 min  Stress: No Stress Concern Present (01/06/2023)   [employer] of Occupational Health - Occupational Stress Questionnaire    Feeling of Stress : Not at all  Social Connections: Unknown (01/06/2023)   Social Connection and Isolation Panel [NHANES]    Frequency of Communication with Friends and Family: Three times a week    Frequency of Social Gatherings with Friends and Family: Three times a week    Attends Religious Services: Patient declined    Active Member of Clubs or Organizations: No    Attends Banker Meetings: Never    Marital Status: Married  Catering manager Violence: Not At Risk (07/13/2022)   Humiliation, Afraid, Rape, and Kick questionnaire    Fear of Current or Ex-Partner: No    Emotionally Abused: No    Physically Abused: No    Sexually Abused: No    Family History  Problem Relation Age of Onset   Diabetes Mother    Heart attack Mother    Hypertension Mother    Arthritis Mother    Skin cancer Father    Arthritis Father    Breast cancer Sister     Current Outpatient Medications  Medication Sig Dispense Refill   amiodarone (PACERONE) 100 MG tablet Take 1 tablet (100 mg total) by mouth daily. 90 tablet 3   Apple Cider Vinegar 500 MG TABS Take 500 mg by mouth at bedtime.     dabigatran (PRADAXA) 150 MG CAPS capsule Take 1 capsule (150 mg total) by mouth 2 (two) times daily. 60 capsule 10   diclofenac sodium (VOLTAREN) 1 % GEL Apply 1 Application topically 3 (three) times daily as needed (pain).  4   furosemide (LASIX) 40 MG tablet Take 1 tablet (40 mg total) by mouth daily. 30 tablet 0   gabapentin (NEURONTIN) 400 MG capsule Take 400 mg by mouth 3 (three) times daily.     Ginger, Zingiber officinalis, (GINGER PO) Take 500 mg by mouth daily.     glimepiride (AMARYL) 4 MG tablet TAKE ONE TABLET ONCE DAILY WITH BREAKFAST 90 tablet 0   Lancets (ONETOUCH DELICA PLUS LANCET33G) MISC CHECK BLOOD SUGAR  TWICE DAILY AS DIRECTED 100 each 2   levothyroxine (SYNTHROID) 150 MCG tablet TAKE ONE TABLET ONCE DAILY BEFORE BREAKFAST 90 tablet 0   metFORMIN (GLUCOPHAGE) 1000 MG tablet TAKE ONE TABLET TWICE DAILY WITH FOOD 180 tablet 0   morphine (MSIR) 15 MG tablet Take 15 mg by mouth every 8 (eight) hours as needed for moderate pain.     ONETOUCH VERIO test strip CHECK BLOOD SUGAR UP TO THREE TIMES DAILY AS DIRECTED 100 strip 12   potassium chloride SA (KLOR-CON M) 20 MEQ tablet Take 1 tablet (20 mEq total) by mouth daily. 60 tablet 1   rosuvastatin (CRESTOR) 10 MG tablet Take 1 tablet (10 mg total) by mouth daily. 90 tablet 3   torsemide (DEMADEX) 20 MG tablet Take 1  tablet (20 mg total) by mouth every other day.     Turmeric (QC TUMERIC COMPLEX) 500 MG CAPS Take 500 mg by mouth at bedtime.     No current facility-administered medications for this visit.    Allergies  Allergen Reactions   Trulicity [Dulaglutide] Hives   Januvia [Sitagliptin] Other (See Comments)    Red eyes   Other     Stomach medicine, not sure of name. Had a mini stroke   Penicillins Swelling    Arm swelling Has patient had a PCN reaction causing immediate rash, facial/tongue/throat swelling, SOB or lightheadedness with hypotension: No Has patient had a PCN reaction causing severe rash involving mucus membranes or skin necrosis: No Has patient had a PCN reaction that required hospitalization No Has patient had a PCN reaction occurring within the last 10 years: No If all of the above answers are "NO", then may proceed with Cephalosporin use.     REVIEW OF SYSTEMS:   [X]  denotes positive finding, [ ]  denotes negative finding Cardiac  Comments:  Chest pain or chest pressure:    Shortness of breath upon exertion:    Short of breath when lying flat:    Irregular heart rhythm:        Vascular    Pain in calf, thigh, or hip brought on by ambulation:    Pain in feet at night that wakes you up from your sleep:     Blood  clot in your veins:    Leg swelling:         Pulmonary    Oxygen at home:    Productive cough:     Wheezing:         Neurologic    Sudden weakness in arms or legs:     Sudden numbness in arms or legs:     Sudden onset of difficulty speaking or slurred speech:    Temporary loss of vision in one eye:     Problems with dizziness:         Gastrointestinal    Blood in stool:     Vomited blood:         Genitourinary    Burning when urinating:     Blood in urine:        Psychiatric    Major depression:         Hematologic    Bleeding problems:    Problems with blood clotting too easily:        Skin    Rashes or ulcers:        Constitutional    Fever or chills:      PHYSICAL EXAMINATION:  Vitals:   01/28/23 1006  BP: (!) 167/76  Pulse: 64  Resp: 18  Temp: 98 F (36.7 C)  TempSrc: Temporal  SpO2: 93%  Weight: 229 lb (103.9 kg)    General:  WDWN in NAD; vital signs documented above Gait: Not observed HENT: WNL, normocephalic Pulmonary: normal non-labored breathing , without Rales, rhonchi,  wheezing Cardiac: regular HR Abdomen: soft, NT, no masses Skin: without rashes Vascular Exam/Pulses: palpable DP pulses Extremities: Venous ulcerations pictured below Musculoskeletal: no muscle wasting or atrophy  Neurologic: A&O X 3 Psychiatric:  The pt has Normal affect.       Non-Invasive Vascular Imaging:   none    ASSESSMENT/PLAN:: 67 y.o. male here for evaluation of venous ulcerations of bilateral lower extremities  Wounds on the right leg have improved compared to the picture from last week.  We will continue right lower extremity Unna boot.  Unfortunately he has developed blistering and ulceration on the left lateral lower extremity since last office visit.  We will also place him in a left lower extremity Unna boot today.  We discussed proper leg elevation, regular compression use, and avoiding prolonged sitting and standing once ulcerations have healed.   I offered to arrange home health for weekly Unna boot changes however the patient states his daughter is a nurse who is willing to perform weekly Unna boot changes at home.  For now he will follow-up on an as-needed basis.   Emilie Rutter, PA-C [facility] [phone]  Clinic MD:   Hetty Blend

## 2023-02-20 ENCOUNTER — Emergency Department (HOSPITAL_COMMUNITY)
Admission: EM | Admit: 2023-02-20 | Discharge: 2023-02-27 | Disposition: E | Payer: Medicare Other | Attending: Student | Admitting: Student

## 2023-02-20 DIAGNOSIS — I10 Essential (primary) hypertension: Secondary | ICD-10-CM | POA: Diagnosis not present

## 2023-02-20 DIAGNOSIS — I213 ST elevation (STEMI) myocardial infarction of unspecified site: Secondary | ICD-10-CM | POA: Diagnosis not present

## 2023-02-20 DIAGNOSIS — N183 Chronic kidney disease, stage 3 unspecified: Secondary | ICD-10-CM | POA: Diagnosis not present

## 2023-02-20 DIAGNOSIS — E039 Hypothyroidism, unspecified: Secondary | ICD-10-CM | POA: Insufficient documentation

## 2023-02-20 DIAGNOSIS — I129 Hypertensive chronic kidney disease with stage 1 through stage 4 chronic kidney disease, or unspecified chronic kidney disease: Secondary | ICD-10-CM | POA: Insufficient documentation

## 2023-02-20 DIAGNOSIS — E1122 Type 2 diabetes mellitus with diabetic chronic kidney disease: Secondary | ICD-10-CM | POA: Insufficient documentation

## 2023-02-20 DIAGNOSIS — Z743 Need for continuous supervision: Secondary | ICD-10-CM | POA: Diagnosis not present

## 2023-02-20 DIAGNOSIS — I469 Cardiac arrest, cause unspecified: Secondary | ICD-10-CM | POA: Diagnosis not present

## 2023-02-20 DIAGNOSIS — R6889 Other general symptoms and signs: Secondary | ICD-10-CM | POA: Diagnosis not present

## 2023-02-20 DIAGNOSIS — Z7984 Long term (current) use of oral hypoglycemic drugs: Secondary | ICD-10-CM | POA: Insufficient documentation

## 2023-02-20 DIAGNOSIS — I499 Cardiac arrhythmia, unspecified: Secondary | ICD-10-CM | POA: Diagnosis not present

## 2023-02-20 DIAGNOSIS — R404 Transient alteration of awareness: Secondary | ICD-10-CM | POA: Diagnosis not present

## 2023-02-20 LAB — COMPREHENSIVE METABOLIC PANEL
ALT: 719 U/L — ABNORMAL HIGH (ref 0–44)
AST: 1236 U/L — ABNORMAL HIGH (ref 15–41)
Albumin: 1.8 g/dL — ABNORMAL LOW (ref 3.5–5.0)
Alkaline Phosphatase: 100 U/L (ref 38–126)
Anion gap: 26 — ABNORMAL HIGH (ref 5–15)
BUN: 44 mg/dL — ABNORMAL HIGH (ref 8–23)
CO2: 9 mmol/L — ABNORMAL LOW (ref 22–32)
Calcium: 7.1 mg/dL — ABNORMAL LOW (ref 8.9–10.3)
Chloride: 96 mmol/L — ABNORMAL LOW (ref 98–111)
Creatinine, Ser: 2.32 mg/dL — ABNORMAL HIGH (ref 0.61–1.24)
GFR, Estimated: 30 mL/min — ABNORMAL LOW (ref >60)
Glucose, Bld: 301 mg/dL — ABNORMAL HIGH (ref 70–99)
Potassium: 5.5 mmol/L — ABNORMAL HIGH (ref 3.5–5.1)
Sodium: 131 mmol/L — ABNORMAL LOW (ref 135–145)
Total Bilirubin: 1.5 mg/dL — ABNORMAL HIGH (ref <1.2)
Total Protein: 5 g/dL — ABNORMAL LOW (ref 6.5–8.1)

## 2023-02-20 LAB — CBC WITH DIFFERENTIAL/PLATELET
Abs Immature Granulocytes: 1 10*3/uL — ABNORMAL HIGH (ref 0.00–0.07)
Band Neutrophils: 4 %
Basophils Absolute: 0 10*3/uL (ref 0.0–0.1)
Basophils Relative: 0 %
Eosinophils Absolute: 0 10*3/uL (ref 0.0–0.5)
Eosinophils Relative: 0 %
HCT: 45.8 % (ref 39.0–52.0)
Hemoglobin: 13.7 g/dL (ref 13.0–17.0)
Lymphocytes Relative: 41 %
Lymphs Abs: 4.9 10*3/uL — ABNORMAL HIGH (ref 0.7–4.0)
MCH: 29.5 pg (ref 26.0–34.0)
MCHC: 29.9 g/dL — ABNORMAL LOW (ref 30.0–36.0)
MCV: 98.7 fL (ref 80.0–100.0)
Metamyelocytes Relative: 6 %
Monocytes Absolute: 0.2 10*3/uL (ref 0.1–1.0)
Monocytes Relative: 2 %
Myelocytes: 1 %
Neutro Abs: 5.9 10*3/uL (ref 1.7–7.7)
Neutrophils Relative %: 45 %
Platelets: 102 10*3/uL — ABNORMAL LOW (ref 150–400)
Promyelocytes Relative: 1 %
RBC: 4.64 MIL/uL (ref 4.22–5.81)
RDW: 13 % (ref 11.5–15.5)
WBC: 12 10*3/uL — ABNORMAL HIGH (ref 4.0–10.5)
nRBC: 0.3 % — ABNORMAL HIGH (ref 0.0–0.2)

## 2023-02-20 LAB — LIPID PANEL
Cholesterol: 37 mg/dL (ref 0–200)
HDL: <10 mg/dL — ABNORMAL LOW (ref >40)
Triglycerides: 129 mg/dL (ref <150)
VLDL: 26 mg/dL (ref 0–40)

## 2023-02-20 LAB — TROPONIN I (HIGH SENSITIVITY): Troponin I (High Sensitivity): 796 ng/L (ref <18)

## 2023-02-20 LAB — CBG MONITORING, ED: Glucose-Capillary: 276 mg/dL — ABNORMAL HIGH (ref 70–99)

## 2023-02-20 MED ORDER — DEXTROSE 50 % IV SOLN
1.0000 | Freq: Once | INTRAVENOUS | Status: AC
Start: 2023-02-20 — End: 2023-02-20
  Administered 2023-02-20: 50 mL via INTRAVENOUS

## 2023-02-20 MED ORDER — EPINEPHRINE PF 1 MG/ML IJ SOLN
1.0000 mg | Freq: Once | INTRAMUSCULAR | Status: AC
  Administered 2023-02-20: 1 mg via INTRAVENOUS

## 2023-02-20 MED ORDER — TENECTEPLASE 50 MG IV KIT
PACK | INTRAVENOUS | Status: AC
  Filled 2023-02-20: qty 10

## 2023-02-20 MED ORDER — CALCIUM GLUCONATE 10 % IV SOLN
INTRAVENOUS | Status: AC
  Administered 2023-02-20: 1 g via INTRAVENOUS
  Filled 2023-02-20: qty 10

## 2023-02-20 MED ORDER — INSULIN ASPART 100 UNIT/ML IJ SOLN
5.0000 [IU] | Freq: Once | INTRAMUSCULAR | Status: AC
  Administered 2023-02-20: 5 [IU] via SUBCUTANEOUS

## 2023-02-20 MED ORDER — EPINEPHRINE HCL 5 MG/250ML IV SOLN IN NS
0.5000 ug/min | INTRAVENOUS | Status: DC
  Administered 2023-02-20: 2 ug/min via INTRAVENOUS

## 2023-02-20 MED ORDER — CALCIUM GLUCONATE 10 % IV SOLN: 1.0000 g | Freq: Once | INTRAVENOUS | Status: AC

## 2023-02-20 MED ORDER — SODIUM BICARBONATE 8.4 % IV SOLN
100.0000 meq | Freq: Once | INTRAVENOUS | Status: AC
  Administered 2023-02-20: 100 meq via INTRAVENOUS

## 2023-02-27 NOTE — ED Notes (Signed)
We regained rosc at 1613 and he went asystole again at 1622. Regained rosc again at 1634 and lost pulses again at 1635. Pt's time of death 80.

## 2023-02-27 NOTE — ED Provider Notes (Signed)
[place] EMERGENCY DEPARTMENT AT [facility] Provider Note  CSN: [id number] Arrival date & time: 01/29/2023 1604  Chief Complaint(s) Cardiac Arrest  HPI Thomas [name] is a 67 y.o. male with PMH severe aortic stenosis status post TAVR, HTN, HLD, CHF, T2DM, CKD who presents Emergency Department for evaluation of cardiac arrest.  EMS was called out for altered mental status and shortness of breath when patient suffered a cardiac arrest at home.  EMS was able to achieve return of spontaneous circulation in the field and noted patient to have STEMI on EKG in the field. were heading emergently to [facility] when the patient lost pulses again and they diverted to the emergency department here at [facility].  Patient arrives with active compressions in place via [place] device and [place] airway.  Patient received 4 rounds of epinephrine and 1 of atropine prior to arrival.  Additional history unable to be obtained as patient is currently in cardiac arrest   Past Medical History Past Medical History:  Diagnosis Date   Cervical myelopathy (HCC)    Diabetes mellitus without complication (HCC)    Hypercholesterolemia    Hypertension    S/P TAVR (transcatheter aortic valve replacement) 07/13/2022   26mm S3UR with Dr. Clifton James and Dr. Delia Chimes   Severe aortic stenosis    Stroke [facility]) 1982   pt states he had a mini stroke in 1982   Patient Active Problem List   Diagnosis Date Noted   Impacted cerumen of both ears 01/23/2023   Hypomagnesemia 07/14/2022   Severe aortic stenosis 07/13/2022   S/P TAVR (transcatheter aortic valve replacement) 07/13/2022   Chronic combined systolic and diastolic heart failure (HCC) 05/12/2022   Mild CAD 05/12/2022   Atrial flutter (HCC) 04/26/2022   Atrial fibrillation with RVR (HCC) 04/26/2022   Myelopathy concurrent with and due to spinal stenosis of cervical region (HCC) 10/28/2021   Aortic stenosis 10/20/2021   CKD stage 3 secondary to diabetes (HCC)  04/05/2018   Heart murmur 07/22/2017   Hypertension associated with diabetes (HCC) 07/22/2017   Hyperlipidemia associated with type 2 diabetes mellitus (HCC) 07/22/2017   Hypothyroidism 07/22/2017   Chronic back pain 07/22/2017   DM2 (diabetes mellitus, type 2) (HCC) 01/14/2016   Home Medication(s) Prior to Admission medications   Medication Sig Start Date End Date Taking? Authorizing Provider  amiodarone (PACERONE) 100 MG tablet Take 1 tablet (100 mg total) by mouth daily. 12/27/22   Nahser, Deloris Ping, MD  Apple Cider Vinegar 500 MG TABS Take 500 mg by mouth at bedtime.    [provider]  dabigatran (PRADAXA) 150 MG CAPS capsule Take 1 capsule (150 mg total) by mouth 2 (two) times daily. 01/18/23   Nahser, Deloris Ping, MD  diclofenac sodium (VOLTAREN) 1 % GEL Apply 1 Application topically 3 (three) times daily as needed (pain). 05/25/17   [provider]  furosemide (LASIX) 40 MG tablet Take 1 tablet (40 mg total) by mouth daily. 01/10/23   Ardith Dark, MD  gabapentin (NEURONTIN) 400 MG capsule Take 400 mg by mouth 3 (three) times daily.    [provider]  Ginger, Zingiber officinalis, (GINGER PO) Take 500 mg by mouth daily.    [provider]  glimepiride (AMARYL) 4 MG tablet TAKE ONE TABLET ONCE DAILY WITH BREAKFAST 12/13/22   Ardith Dark, MD  Lancets [facility] DELICA PLUS [place]) MISC CHECK BLOOD SUGAR TWICE DAILY AS DIRECTED 11/16/22   Ardith Dark, MD  levothyroxine (SYNTHROID) 150 MCG  tablet TAKE ONE TABLET ONCE DAILY BEFORE BREAKFAST 01/07/23   Ardith Dark, MD  metFORMIN (GLUCOPHAGE) 1000 MG tablet TAKE ONE TABLET TWICE DAILY WITH FOOD 12/13/22   Ardith Dark, MD  morphine (MSIR) 15 MG tablet Take 15 mg by mouth every 8 (eight) hours as needed for moderate pain. 05/18/22   [provider]  ONETOUCH VERIO test strip CHECK BLOOD SUGAR UP TO THREE TIMES DAILY AS DIRECTED 05/13/22   Ardith Dark, MD  potassium chloride SA  (KLOR-CON M) 20 MEQ tablet Take 1 tablet (20 mEq total) by mouth daily. 08/19/22   Georgie Chard D, NP  rosuvastatin (CRESTOR) 10 MG tablet Take 1 tablet (10 mg total) by mouth daily. 05/12/22   Perlie Gold, PA-C  torsemide (DEMADEX) 20 MG tablet Take 1 tablet (20 mg total) by mouth every other day. 10/21/22   Filbert Schilder, NP  Turmeric (QC TUMERIC COMPLEX) 500 MG CAPS Take 500 mg by mouth at bedtime.    [provider]                                                                                                                                    Past Surgical History Past Surgical History:  Procedure Laterality Date   ANTERIOR CERVICAL DECOMP/DISCECTOMY FUSION N/A 10/28/2021   Procedure: ACDF - C3-C4 - C4-C5 - C5-C6;  Surgeon: [name], MD;  Location: [facility] OR;  Service: Neurosurgery;  Laterality: N/A;   BACK SURGERY     CARDIOVERSION N/A 04/28/2022   Procedure: CARDIOVERSION;  Surgeon: Jodelle Red, MD;  Location: [facility] ENDOSCOPY;  Service: Cardiovascular;  Laterality: N/A;   CARDIOVERSION N/A 05/31/2022   Procedure: CARDIOVERSION;  Surgeon: Meriam Sprague, MD;  Location: [facility] ENDOSCOPY;  Service: Cardiovascular;  Laterality: N/A;   HERNIA REPAIR     INTRAOPERATIVE TRANSTHORACIC ECHOCARDIOGRAM N/A 07/13/2022   Procedure: INTRAOPERATIVE TRANSTHORACIC ECHOCARDIOGRAM;  Surgeon: Kathleene Hazel, MD;  Location: [facility] INVASIVE CV LAB;  Service: Open Heart Surgery;  Laterality: N/A;   RIGHT/LEFT HEART CATH AND CORONARY ANGIOGRAPHY N/A 04/27/2022   Procedure: RIGHT/LEFT HEART CATH AND CORONARY ANGIOGRAPHY;  Surgeon: Lennette Bihari, MD;  Location: [facility] INVASIVE CV LAB;  Service: Cardiovascular;  Laterality: N/A;   TEE WITHOUT CARDIOVERSION N/A 04/28/2022   Procedure: TRANSESOPHAGEAL ECHOCARDIOGRAM (TEE);  Surgeon: Jodelle Red, MD;  Location: [facility] ENDOSCOPY;  Service: Cardiovascular;  Laterality: N/A;   TRANSCATHETER AORTIC VALVE REPLACEMENT, TRANSFEMORAL N/A 07/13/2022    Procedure: Transcatheter Aortic Valve Replacement, Transfemoral;  Surgeon: Kathleene Hazel, MD;  Location: [facility] INVASIVE CV LAB;  Service: Open Heart Surgery;  Laterality: N/A;   Family History Family History  Problem Relation Age of Onset   Diabetes Mother    Heart attack Mother    Hypertension Mother    Arthritis Mother    Skin cancer Father    Arthritis Father    Breast cancer Sister     Social  History Social History   Tobacco Use   Smoking status: Never   Smokeless tobacco: Never  Vaping Use   Vaping status: Never Used  Substance Use Topics   Alcohol use: Never    Comment: occ beer    Drug use: No   Allergies Trulicity [dulaglutide], Januvia [sitagliptin], Other, and Penicillins  Review of Systems Review of Systems  Unable to perform ROS: Patient unresponsive    Physical Exam Vital Signs  I have reviewed the triage vital signs BP (!) 113/32   Resp 18   Physical Exam Vitals and nursing note reviewed.  Constitutional:      General: He is in acute distress.     Appearance: He is well-developed. He is toxic-appearing and diaphoretic.  HENT:     Head: Normocephalic and atraumatic.  Cardiovascular:     Heart sounds: No murmur heard. Pulmonary:     Effort: Respiratory distress present.  Abdominal:     Palpations: Abdomen is soft.     Tenderness: There is no abdominal tenderness.  Musculoskeletal:        General: No swelling.     Cervical back: Neck supple.  Skin:    Capillary Refill: Capillary refill takes more than 3 seconds.     Coloration: Skin is pale.  Neurological:     Mental Status: He is alert.     ED Results and Treatments Labs (all labs ordered are listed, but only abnormal results are displayed) Labs Reviewed  CBC WITH DIFFERENTIAL/PLATELET - Abnormal; Notable for the following components:      Result Value   WBC 12.0 (*)    MCHC 29.9 (*)    Platelets 102 (*)    nRBC 0.3 (*)    Lymphs Abs 4.9 (*)    Abs Immature Granulocytes  1.00 (*)    All other components within normal limits  COMPREHENSIVE METABOLIC PANEL - Abnormal; Notable for the following components:   Sodium 131 (*)    Potassium 5.5 (*)    Chloride 96 (*)    CO2 9 (*)    Glucose, Bld 301 (*)    BUN 44 (*)    Creatinine, Ser 2.32 (*)    Calcium 7.1 (*)    Total Protein 5.0 (*)    Albumin 1.8 (*)    AST 1,236 (*)    ALT 719 (*)    Total Bilirubin 1.5 (*)    GFR, Estimated 30 (*)    Anion gap 26 (*)    All other components within normal limits  LIPID PANEL - Abnormal; Notable for the following components:   HDL <10 (*)    All other components within normal limits  CBG MONITORING, ED - Abnormal; Notable for the following components:   Glucose-Capillary 276 (*)    All other components within normal limits  TROPONIN I (HIGH SENSITIVITY) - Abnormal; Notable for the following components:   Troponin I (High Sensitivity) 796 (*)    All other components within normal limits  Radiology No results found.  Pertinent labs & imaging results that were available during my care of the patient were reviewed by me and considered in my medical decision making (see MDM for details).  Medications Ordered in ED Medications  tenecteplase (TNKASE) 50 MG injection for PE/MI (has no administration in time range)  EPINEPHrine (ADRENALIN) 5 mg in NS 250 mL (0.02 mg/mL) premix infusion (0 mcg/min Intravenous Stopped 02/11/2023 1637)  EPINEPHrine (ADRENALIN) 1 mg (1 mg Intravenous Given 02/23/2023 1605)  EPINEPHrine (ADRENALIN) 1 mg (1 mg Intravenous Given 02/26/2023 1610)  calcium gluconate inj 10% (1 g) URGENT USE ONLY! (1 g Intravenous Given 02/06/2023 1611)  sodium bicarbonate injection 100 mEq (100 mEq Intravenous Given 02/04/2023 1612)  EPINEPHrine (ADRENALIN) 1 mg (1 mg Intravenous Given 02/15/2023 1614)  EPINEPHrine (ADRENALIN) 1 mg (1 mg Intravenous  Given 02/18/2023 1623)  EPINEPHrine (ADRENALIN) 1 mg (1 mg Intravenous Given 01/28/2023 1625)  sodium bicarbonate injection 100 mEq (100 mEq Intravenous Given 02/13/2023 1627)  dextrose 50 % solution 50 mL (50 mLs Intravenous Given 02/24/2023 1633)  insulin aspart (novoLOG) injection 5 Units (5 Units Subcutaneous Given 02/21/2023 1651)  EPINEPHrine (ADRENALIN) 1 mg (1 mg Intravenous Given 02/05/2023 1635)                                                                                                                                     Procedures .Critical Care  Performed by: Glendora Score, MD Authorized by: Glendora Score, MD   Critical care provider statement:    Critical care time (minutes):  75   Critical care was necessary to treat or prevent imminent or life-threatening deterioration of the following conditions:  Cardiac failure   Critical care was time spent personally by me on the following activities:  Development of treatment plan with patient or surrogate, discussions with consultants, evaluation of patient's response to treatment, examination of patient, ordering and review of laboratory studies, ordering and review of radiographic studies, ordering and performing treatments and interventions, pulse oximetry, re-evaluation of patient's condition and review of old charts Procedure Name: Intubation Date/Time: 02/25/2023 7:32 PM  Performed by: Glendora Score, MDPre-anesthesia Checklist: Patient identified, Patient being monitored, Emergency Drugs available, Timeout performed and Suction available Oxygen Delivery Method: Non-rebreather mask Preoxygenation: Pre-oxygenation with 100% oxygen Induction Type: Rapid sequence Ventilation: Mask ventilation without difficulty Laryngoscope Size: Glidescope and 3 Grade View: Grade I Tube size: 7.5 mm Number of attempts: 1 Airway Equipment and Method: Video-laryngoscopy Placement Confirmation: ETT inserted through vocal cords under direct vision,  CO2 detector and Breath sounds checked- equal and bilateral      (including critical care time)  Medical Decision Making / ED Course   This patient presents to the ED for concern of cardiac arrest, this involves an extensive number of treatment options, and is a complaint that carries with it a high risk of complications and morbidity.  The differential diagnosis includes MI, aortic dissection,  PE, electrolyte abnormality, hypoxic respiratory failure, ruptured aneurysm, aortic dissection  MDM: Patient seen emergency room for evaluation of cardiac arrest.  Physical exam reveals a toxic appearing patient receiving active compressions with King airway in place.  King airway removed and patient intubated without sedation or paralytics and patient did not have any gag, no movement of the vocal cords on video laryngoscopy.  Intubation successful via direct visualization and color change.  Laboratory evaluation with leukocytosis to 12.0, sodium 131, potassium 5.5, bicarb 9, BUN 44, creatinine 2.32, AST 1236, ALT 719, high-sensitivity troponin 796.  Patient received multiple rounds of CPR and patient did have return of spontaneous circulation once and patient aggressively resuscitated with epinephrine drip, multiple rounds of bicarb, calcium gluconate, insulin, glucose.  Patient with junctional rhythm on cardiac monitoring and I did speak with the STEMI cardiologist on-call Dr. Okey Dupre who is not recommending prophylactic thrombolytics in the setting of a possible STEMI and is requesting to transfer patient to [facility] if his vital signs stabilized.  Unfortunately while on the phone with the cardiologist, patient lost pulses again and CPR reinitiated.  I brought the family to the patient's bedside and after 1 round of CPR patient did have retained of spontaneous circulation with persistent junctional rhythm.  The patient's total downtime is been greater than 30 minutes and I am worried the patient will have a very  poor neurologic outcome.  After extended discussions with the patient's family, we decided to cease resuscitative efforts for futility.  Patient subsequently expired.  Time of death 41.   Additional history obtained: -Additional history obtained from multiple family members -External records from outside source obtained and reviewed including: Chart review including previous notes, labs, imaging, consultation notes   Lab Tests: -I ordered, reviewed, and interpreted labs.   The pertinent results include:   Labs Reviewed  CBC WITH DIFFERENTIAL/PLATELET - Abnormal; Notable for the following components:      Result Value   WBC 12.0 (*)    MCHC 29.9 (*)    Platelets 102 (*)    nRBC 0.3 (*)    Lymphs Abs 4.9 (*)    Abs Immature Granulocytes 1.00 (*)    All other components within normal limits  COMPREHENSIVE METABOLIC PANEL - Abnormal; Notable for the following components:   Sodium 131 (*)    Potassium 5.5 (*)    Chloride 96 (*)    CO2 9 (*)    Glucose, Bld 301 (*)    BUN 44 (*)    Creatinine, Ser 2.32 (*)    Calcium 7.1 (*)    Total Protein 5.0 (*)    Albumin 1.8 (*)    AST 1,236 (*)    ALT 719 (*)    Total Bilirubin 1.5 (*)    GFR, Estimated 30 (*)    Anion gap 26 (*)    All other components within normal limits  LIPID PANEL - Abnormal; Notable for the following components:   HDL <10 (*)    All other components within normal limits  CBG MONITORING, ED - Abnormal; Notable for the following components:   Glucose-Capillary 276 (*)    All other components within normal limits  TROPONIN I (HIGH SENSITIVITY) - Abnormal; Notable for the following components:   Troponin I (High Sensitivity) 796 (*)    All other components within normal limits      EKG   EKG Interpretation Date/Time:  Sunday February 20 2023 16:15:38 EST Ventricular Rate:  94 PR Interval:  QRS Duration:  175 QT Interval:  438 QTC Calculation: 551 R Axis:   62  Text Interpretation: Junctional rhythm   ST elevation in inferior leads *high concern for STEMI* Confirmed by Dajha Urquilla ([id number]) on 02/18/2023 7:40:32 PM        Medicines ordered and prescription drug management: Meds ordered this encounter  Medications   tenecteplase (TNKASE) 50 MG injection for PE/MI    [place], Kristy A: cabinet override   calcium gluconate 10 % injection    Michail Jewels, Hala E: cabinet override   EPINEPHrine (ADRENALIN) 1 mg   EPINEPHrine (ADRENALIN) 5 mg in NS 250 mL (0.02 mg/mL) premix infusion   EPINEPHrine (ADRENALIN) 1 mg   calcium gluconate inj 10% (1 g) URGENT USE ONLY!   sodium bicarbonate injection 100 mEq   EPINEPHrine (ADRENALIN) 1 mg   EPINEPHrine (ADRENALIN) 1 mg   EPINEPHrine (ADRENALIN) 1 mg   sodium bicarbonate injection 100 mEq   dextrose 50 % solution 50 mL   insulin aspart (novoLOG) injection 5 Units   EPINEPHrine (ADRENALIN) 1 mg    -I have reviewed the patients home medicines and have made adjustments as needed  Critical interventions CPR, epinephrine, intubation  Consultations Obtained: I requested consultation with the STEMI cardiologist Dr. Okey Dupre,  and discussed lab and imaging findings as well as pertinent plan - they recommend: Transfer to [facility] if stable, no thrombolytics   Cardiac Monitoring: The patient was maintained on a cardiac monitor.  I personally viewed and interpreted the cardiac monitored which showed an underlying rhythm of: Junctional rhythm, asystole  Social Determinants of Health:  Factors impacting patients care include: none   Reevaluation: After the interventions noted above, I reevaluated the patient and found that they have :worsened  Co morbidities that complicate the patient evaluation  Past Medical History:  Diagnosis Date   Cervical myelopathy (HCC)    Diabetes mellitus without complication (HCC)    Hypercholesterolemia    Hypertension    S/P TAVR (transcatheter aortic valve replacement) 07/13/2022   26mm S3UR with Dr. Clifton James  and Dr. Delia Chimes   Severe aortic stenosis    Stroke [facility]) 1982   pt states he had a mini stroke in 1982      Dispostion: I considered admission for this patient, but unfortunately patient expired     Final Clinical Impression(s) / ED Diagnoses Final diagnoses:  Cardiac arrest [facility])     @PCDICTATION @    Glendora Score, MD 02/07/2023 1944

## 2023-02-27 NOTE — ED Triage Notes (Signed)
Ems called out for altered mental status and when they arrived EKG showed stemi and on the way to [facility] pt lost pulses. Ems gave pt 4 epi and 1 atropine. Pt came in on [place] device with king airway.

## 2023-02-27 NOTE — ED Notes (Signed)
STEMI PAGED TO DR [facility]

## 2023-02-27 DEATH — deceased

## 2023-07-11 ENCOUNTER — Ambulatory Visit: Payer: Medicare Other | Admitting: Family Medicine
